# Patient Record
Sex: Male | Born: 1950
Health system: Southern US, Community
[De-identification: ages and names within clinical notes are randomized; demographics above are authoritative.]

## PROBLEM LIST (undated history)

## (undated) DIAGNOSIS — F319 Bipolar disorder, unspecified: Secondary | ICD-10-CM

## (undated) DIAGNOSIS — C801 Malignant (primary) neoplasm, unspecified: Secondary | ICD-10-CM

## (undated) DIAGNOSIS — J449 Chronic obstructive pulmonary disease, unspecified: Secondary | ICD-10-CM

## (undated) DIAGNOSIS — F419 Anxiety disorder, unspecified: Secondary | ICD-10-CM

## (undated) DIAGNOSIS — F32A Depression, unspecified: Secondary | ICD-10-CM

## (undated) DIAGNOSIS — F1910 Other psychoactive substance abuse, uncomplicated: Secondary | ICD-10-CM

## (undated) DIAGNOSIS — K7460 Unspecified cirrhosis of liver: Secondary | ICD-10-CM

## (undated) DIAGNOSIS — I639 Cerebral infarction, unspecified: Secondary | ICD-10-CM

## (undated) DIAGNOSIS — B1920 Unspecified viral hepatitis C without hepatic coma: Secondary | ICD-10-CM

## (undated) DIAGNOSIS — F329 Major depressive disorder, single episode, unspecified: Secondary | ICD-10-CM

## (undated) DIAGNOSIS — R0902 Hypoxemia: Secondary | ICD-10-CM

## (undated) DIAGNOSIS — R011 Cardiac murmur, unspecified: Secondary | ICD-10-CM

## (undated) HISTORY — DX: Hypoxemia: R09.02

## (undated) HISTORY — PX: OTHER SURGICAL HISTORY: SHX169

## (undated) HISTORY — DX: Chronic obstructive pulmonary disease, unspecified: J44.9

---

## 2000-10-18 ENCOUNTER — Inpatient Hospital Stay (HOSPITAL_COMMUNITY): Admission: EM | Admit: 2000-10-18 | Discharge: 2000-10-23 | Payer: Self-pay | Admitting: Psychiatry

## 2004-12-24 ENCOUNTER — Ambulatory Visit: Payer: Self-pay | Admitting: Family Medicine

## 2005-03-05 ENCOUNTER — Ambulatory Visit: Payer: Self-pay | Admitting: Family Medicine

## 2005-03-17 ENCOUNTER — Ambulatory Visit: Payer: Self-pay | Admitting: Family Medicine

## 2005-03-30 ENCOUNTER — Ambulatory Visit: Payer: Self-pay | Admitting: Family Medicine

## 2005-04-20 ENCOUNTER — Ambulatory Visit: Payer: Self-pay | Admitting: Family Medicine

## 2005-04-30 ENCOUNTER — Ambulatory Visit: Payer: Self-pay | Admitting: Family Medicine

## 2005-07-03 ENCOUNTER — Ambulatory Visit: Payer: Self-pay | Admitting: Family Medicine

## 2005-08-04 ENCOUNTER — Ambulatory Visit: Payer: Self-pay | Admitting: Family Medicine

## 2005-09-18 ENCOUNTER — Ambulatory Visit: Payer: Self-pay | Admitting: Family Medicine

## 2005-10-20 ENCOUNTER — Ambulatory Visit: Payer: Self-pay | Admitting: Internal Medicine

## 2005-10-20 ENCOUNTER — Encounter: Payer: Self-pay | Admitting: Internal Medicine

## 2005-10-20 ENCOUNTER — Ambulatory Visit (HOSPITAL_COMMUNITY): Admission: RE | Admit: 2005-10-20 | Discharge: 2005-10-20 | Payer: Self-pay | Admitting: Internal Medicine

## 2005-12-07 ENCOUNTER — Ambulatory Visit: Payer: Self-pay | Admitting: Family Medicine

## 2005-12-18 ENCOUNTER — Ambulatory Visit: Payer: Self-pay | Admitting: Family Medicine

## 2006-02-15 ENCOUNTER — Ambulatory Visit: Payer: Self-pay | Admitting: Cardiology

## 2007-02-17 ENCOUNTER — Ambulatory Visit: Payer: Self-pay | Admitting: Psychiatry

## 2007-02-17 ENCOUNTER — Inpatient Hospital Stay (HOSPITAL_COMMUNITY): Admission: AD | Admit: 2007-02-17 | Discharge: 2007-02-24 | Payer: Self-pay | Admitting: Psychiatry

## 2008-03-20 ENCOUNTER — Emergency Department (HOSPITAL_COMMUNITY): Admission: EM | Admit: 2008-03-20 | Discharge: 2008-03-21 | Payer: Self-pay | Admitting: Emergency Medicine

## 2008-06-02 ENCOUNTER — Emergency Department: Payer: Self-pay | Admitting: Emergency Medicine

## 2009-01-29 ENCOUNTER — Other Ambulatory Visit: Payer: Self-pay

## 2009-01-30 ENCOUNTER — Inpatient Hospital Stay (HOSPITAL_COMMUNITY): Admission: AD | Admit: 2009-01-30 | Discharge: 2009-02-06 | Payer: Self-pay | Admitting: Psychiatry

## 2009-01-30 ENCOUNTER — Ambulatory Visit: Payer: Self-pay | Admitting: Psychiatry

## 2009-04-17 ENCOUNTER — Ambulatory Visit: Payer: Self-pay | Admitting: Psychiatry

## 2009-04-17 ENCOUNTER — Other Ambulatory Visit: Payer: Self-pay | Admitting: Emergency Medicine

## 2009-04-17 ENCOUNTER — Inpatient Hospital Stay (HOSPITAL_COMMUNITY): Admission: RE | Admit: 2009-04-17 | Discharge: 2009-04-25 | Payer: Self-pay | Admitting: Psychiatry

## 2009-12-31 ENCOUNTER — Ambulatory Visit: Payer: Self-pay | Admitting: Psychiatry

## 2009-12-31 ENCOUNTER — Other Ambulatory Visit: Payer: Self-pay

## 2009-12-31 ENCOUNTER — Inpatient Hospital Stay (HOSPITAL_COMMUNITY): Admission: AD | Admit: 2009-12-31 | Discharge: 2010-01-04 | Payer: Self-pay | Admitting: Psychiatry

## 2010-08-09 ENCOUNTER — Other Ambulatory Visit: Payer: Self-pay | Admitting: Emergency Medicine

## 2010-08-09 ENCOUNTER — Inpatient Hospital Stay (HOSPITAL_COMMUNITY)
Admission: AD | Admit: 2010-08-09 | Discharge: 2010-08-15 | Payer: Self-pay | Source: Home / Self Care | Admitting: Psychiatry

## 2010-08-09 ENCOUNTER — Ambulatory Visit: Payer: Self-pay | Admitting: Psychiatry

## 2010-09-10 ENCOUNTER — Emergency Department (HOSPITAL_COMMUNITY): Admission: EM | Admit: 2010-09-10 | Discharge: 2010-09-10 | Payer: Self-pay | Admitting: Emergency Medicine

## 2010-11-06 ENCOUNTER — Ambulatory Visit: Admit: 2010-11-06 | Payer: Self-pay | Admitting: Gastroenterology

## 2010-11-18 ENCOUNTER — Encounter (INDEPENDENT_AMBULATORY_CARE_PROVIDER_SITE_OTHER): Payer: Self-pay

## 2010-12-11 NOTE — Letter (Signed)
Summary: Recall, Screening Colonoscopy Only  [facility]  [address]   [place], [place] [address]   Phone: [phone]  Fax: [phone]    November 18, 2010  THADEUS GANDOLFI [address] Shaune Pollack [place], [place]  [address] Jun 06, 1951   Dear Mr. THOMLEY,   Our records indicate it is time to schedule your colonoscopy.    Please call our office at [phone] and ask for the nurse.   Thank you,  Hendricks Limes, LPN Cloria Spring, LPN  [facility] Ph: [phone]   Fax: [phone]

## 2011-01-20 LAB — HEPATIC FUNCTION PANEL
AST: 162 U/L — ABNORMAL HIGH (ref 0–37)
Albumin: 3.8 g/dL (ref 3.5–5.2)
Alkaline Phosphatase: 90 U/L (ref 39–117)
Total Bilirubin: 0.9 mg/dL (ref 0.3–1.2)
Total Protein: 8.7 g/dL — ABNORMAL HIGH (ref 6.0–8.3)

## 2011-01-20 LAB — DIFFERENTIAL
Lymphs Abs: 3.7 10*3/uL (ref 0.7–4.0)
Monocytes Relative: 9 % (ref 3–12)
Neutro Abs: 6.1 10*3/uL (ref 1.7–7.7)
Neutrophils Relative %: 57 % (ref 43–77)

## 2011-01-20 LAB — URINALYSIS, ROUTINE W REFLEX MICROSCOPIC
Glucose, UA: NEGATIVE mg/dL
Ketones, ur: NEGATIVE mg/dL
Leukocytes, UA: NEGATIVE
Nitrite: NEGATIVE
Protein, ur: 30 mg/dL — AB
pH: 5.5 (ref 5.0–8.0)

## 2011-01-20 LAB — BASIC METABOLIC PANEL
Calcium: 8.6 mg/dL (ref 8.4–10.5)
Creatinine, Ser: 0.89 mg/dL (ref 0.4–1.5)
GFR calc Af Amer: >60 mL/min (ref >60)
GFR calc non Af Amer: >60 mL/min (ref >60)
Sodium: 136 mEq/L (ref 135–145)

## 2011-01-20 LAB — POCT CARDIAC MARKERS
Myoglobin, poc: 160 ng/mL (ref 12–200)
Troponin i, poc: <0.05 ng/mL (ref 0.00–0.09)

## 2011-01-20 LAB — CBC
Hemoglobin: 16.4 g/dL (ref 13.0–17.0)
Platelets: 99 10*3/uL — ABNORMAL LOW (ref 150–400)
RBC: 4.81 MIL/uL (ref 4.22–5.81)
WBC: 10.9 10*3/uL — ABNORMAL HIGH (ref 4.0–10.5)

## 2011-01-20 LAB — RAPID URINE DRUG SCREEN, HOSP PERFORMED
Amphetamines: NOT DETECTED
Benzodiazepines: POSITIVE — AB
Tetrahydrocannabinol: NOT DETECTED

## 2011-01-20 LAB — ETHANOL: Alcohol, Ethyl (B): 144 mg/dL — ABNORMAL HIGH (ref 0–10)

## 2011-01-20 LAB — URINE MICROSCOPIC-ADD ON

## 2011-01-22 LAB — BASIC METABOLIC PANEL
BUN: 5 mg/dL — ABNORMAL LOW (ref 6–23)
CO2: 21 mEq/L (ref 19–32)
Chloride: 105 mEq/L (ref 96–112)
Creatinine, Ser: 0.92 mg/dL (ref 0.4–1.5)
Glucose, Bld: 98 mg/dL (ref 70–99)
Potassium: 3.5 mEq/L (ref 3.5–5.1)

## 2011-01-22 LAB — DIFFERENTIAL
Basophils Absolute: 0 10*3/uL (ref 0.0–0.1)
Basophils Relative: 0 % (ref 0–1)
Eosinophils Absolute: 0.1 10*3/uL (ref 0.0–0.7)
Eosinophils Relative: 2 % (ref 0–5)
Lymphocytes Relative: 42 % (ref 12–46)
Monocytes Absolute: 0.7 10*3/uL (ref 0.1–1.0)

## 2011-01-22 LAB — CBC
HCT: 45.8 % (ref 39.0–52.0)
MCH: 34.9 pg — ABNORMAL HIGH (ref 26.0–34.0)
MCHC: 34.7 g/dL (ref 30.0–36.0)
MCV: 100.6 fL — ABNORMAL HIGH (ref 78.0–100.0)
Platelets: 88 10*3/uL — ABNORMAL LOW (ref 150–400)
RDW: 13.7 % (ref 11.5–15.5)

## 2011-01-22 LAB — RAPID URINE DRUG SCREEN, HOSP PERFORMED
Cocaine: POSITIVE — AB
Tetrahydrocannabinol: NOT DETECTED

## 2011-01-22 LAB — SALICYLATE LEVEL: Salicylate Lvl: <4 mg/dL (ref 2.8–20.0)

## 2011-01-22 LAB — ETHANOL: Alcohol, Ethyl (B): 95 mg/dL — ABNORMAL HIGH (ref 0–10)

## 2011-01-28 LAB — RAPID URINE DRUG SCREEN, HOSP PERFORMED
Cocaine: POSITIVE — AB
Opiates: NOT DETECTED

## 2011-01-28 LAB — BASIC METABOLIC PANEL
BUN: 8 mg/dL (ref 6–23)
GFR calc Af Amer: >60 mL/min (ref >60)
GFR calc non Af Amer: >60 mL/min (ref >60)
Potassium: 3.5 mEq/L (ref 3.5–5.1)

## 2011-01-28 LAB — CBC
HCT: 49.9 % (ref 39.0–52.0)
Platelets: 95 10*3/uL — ABNORMAL LOW (ref 150–400)
RBC: 5.31 MIL/uL (ref 4.22–5.81)
WBC: 9.8 10*3/uL (ref 4.0–10.5)

## 2011-01-28 LAB — DIFFERENTIAL
Eosinophils Relative: 1 % (ref 0–5)
Lymphocytes Relative: 43 % (ref 12–46)
Lymphs Abs: 4.2 10*3/uL — ABNORMAL HIGH (ref 0.7–4.0)
Monocytes Relative: 10 % (ref 3–12)
Neutrophils Relative %: 45 % (ref 43–77)

## 2011-01-28 LAB — URINALYSIS, ROUTINE W REFLEX MICROSCOPIC
Glucose, UA: 250 mg/dL — AB
Leukocytes, UA: NEGATIVE
pH: 5.5 (ref 5.0–8.0)

## 2011-01-28 LAB — ETHANOL: Alcohol, Ethyl (B): 127 mg/dL — ABNORMAL HIGH (ref 0–10)

## 2011-01-28 LAB — URINE MICROSCOPIC-ADD ON: RBC / HPF: NONE SEEN RBC/hpf (ref <3)

## 2011-02-04 ENCOUNTER — Emergency Department (HOSPITAL_COMMUNITY)
Admission: EM | Admit: 2011-02-04 | Discharge: 2011-02-04 | Disposition: A | Discharge status: Other Acute Inpatient Hospital | Payer: Medicare Other | Attending: Emergency Medicine | Admitting: Emergency Medicine

## 2011-02-04 DIAGNOSIS — R45851 Suicidal ideations: Secondary | ICD-10-CM | POA: Insufficient documentation

## 2011-02-04 DIAGNOSIS — F3289 Other specified depressive episodes: Secondary | ICD-10-CM | POA: Insufficient documentation

## 2011-02-04 DIAGNOSIS — F329 Major depressive disorder, single episode, unspecified: Secondary | ICD-10-CM | POA: Insufficient documentation

## 2011-02-04 LAB — RAPID URINE DRUG SCREEN, HOSP PERFORMED: Benzodiazepines: POSITIVE — AB

## 2011-02-04 LAB — BASIC METABOLIC PANEL
Chloride: 105 mEq/L (ref 96–112)
Creatinine, Ser: 1.01 mg/dL (ref 0.4–1.5)
GFR calc Af Amer: >60 mL/min (ref >60)

## 2011-02-04 LAB — URINALYSIS, ROUTINE W REFLEX MICROSCOPIC
Glucose, UA: NEGATIVE mg/dL
Protein, ur: NEGATIVE mg/dL
Urobilinogen, UA: 0.2 mg/dL (ref 0.0–1.0)

## 2011-02-04 LAB — CBC
MCH: 30.6 pg (ref 26.0–34.0)
MCHC: 34.8 g/dL (ref 30.0–36.0)
MCV: 87.8 fL (ref 78.0–100.0)
Platelets: 71 10*3/uL — ABNORMAL LOW (ref 150–400)
RBC: 4.58 MIL/uL (ref 4.22–5.81)
RDW: 14.2 % (ref 11.5–15.5)

## 2011-02-04 LAB — DIFFERENTIAL
Eosinophils Absolute: 0.2 10*3/uL (ref 0.0–0.7)
Eosinophils Relative: 2 % (ref 0–5)
Lymphs Abs: 3.4 10*3/uL (ref 0.7–4.0)
Monocytes Absolute: 0.9 10*3/uL (ref 0.1–1.0)
Monocytes Relative: 10 % (ref 3–12)

## 2011-02-04 LAB — ETHANOL: Alcohol, Ethyl (B): 129 mg/dL — ABNORMAL HIGH (ref 0–10)

## 2011-02-16 LAB — CBC
HCT: 45.6 % (ref 39.0–52.0)
Platelets: 132 10*3/uL — ABNORMAL LOW (ref 150–400)
WBC: 5.4 10*3/uL (ref 4.0–10.5)

## 2011-02-16 LAB — LIPID PANEL: VLDL: 41 mg/dL — ABNORMAL HIGH (ref 0–40)

## 2011-02-16 LAB — DIFFERENTIAL
Eosinophils Relative: 2 % (ref 0–5)
Lymphocytes Relative: 32 % (ref 12–46)
Lymphs Abs: 1.7 10*3/uL (ref 0.7–4.0)
Neutro Abs: 3 10*3/uL (ref 1.7–7.7)

## 2011-02-16 LAB — ETHANOL: Alcohol, Ethyl (B): <5 mg/dL (ref 0–10)

## 2011-02-16 LAB — HEPATIC FUNCTION PANEL
ALT: 187 U/L — ABNORMAL HIGH (ref 0–53)
ALT: 210 U/L — ABNORMAL HIGH (ref 0–53)
AST: 177 U/L — ABNORMAL HIGH (ref 0–37)
AST: 183 U/L — ABNORMAL HIGH (ref 0–37)
Bilirubin, Direct: 0.1 mg/dL (ref 0.0–0.3)
Total Bilirubin: 0.6 mg/dL (ref 0.3–1.2)
Total Protein: 7.1 g/dL (ref 6.0–8.3)

## 2011-02-16 LAB — GLUCOSE, CAPILLARY: Glucose-Capillary: 161 mg/dL — ABNORMAL HIGH (ref 70–99)

## 2011-02-16 LAB — RAPID URINE DRUG SCREEN, HOSP PERFORMED
Cocaine: NOT DETECTED
Tetrahydrocannabinol: NOT DETECTED

## 2011-02-16 LAB — BASIC METABOLIC PANEL
BUN: 9 mg/dL (ref 6–23)
Calcium: 9.2 mg/dL (ref 8.4–10.5)
GFR calc non Af Amer: >60 mL/min (ref >60)
Potassium: 3.9 mEq/L (ref 3.5–5.1)
Sodium: 135 mEq/L (ref 135–145)

## 2011-02-16 LAB — AMYLASE: Amylase: 202 U/L — ABNORMAL HIGH (ref 27–131)

## 2011-02-19 LAB — URINALYSIS, ROUTINE W REFLEX MICROSCOPIC
Leukocytes, UA: NEGATIVE
Nitrite: NEGATIVE
Specific Gravity, Urine: 1.015 (ref 1.005–1.030)
Urobilinogen, UA: 0.2 mg/dL (ref 0.0–1.0)
pH: 5.5 (ref 5.0–8.0)

## 2011-02-19 LAB — DIFFERENTIAL
Basophils Absolute: 0 10*3/uL (ref 0.0–0.1)
Eosinophils Relative: 1 % (ref 0–5)
Lymphocytes Relative: 40 % (ref 12–46)
Monocytes Absolute: 0.5 10*3/uL (ref 0.1–1.0)
Monocytes Relative: 7 % (ref 3–12)
Neutro Abs: 3.7 10*3/uL (ref 1.7–7.7)

## 2011-02-19 LAB — CBC
HCT: 48.2 % (ref 39.0–52.0)
Hemoglobin: 17 g/dL (ref 13.0–17.0)
MCHC: 35.4 g/dL (ref 30.0–36.0)
RBC: 5.01 MIL/uL (ref 4.22–5.81)
RDW: 13.9 % (ref 11.5–15.5)

## 2011-02-19 LAB — BASIC METABOLIC PANEL
CO2: 27 mEq/L (ref 19–32)
Calcium: 8.7 mg/dL (ref 8.4–10.5)
GFR calc Af Amer: >60 mL/min (ref >60)
GFR calc non Af Amer: >60 mL/min (ref >60)
Glucose, Bld: 109 mg/dL — ABNORMAL HIGH (ref 70–99)
Potassium: 3.9 mEq/L (ref 3.5–5.1)
Sodium: 141 mEq/L (ref 135–145)

## 2011-02-19 LAB — PLATELET COUNT: Platelets: 71 10*3/uL — ABNORMAL LOW (ref 150–400)

## 2011-02-19 LAB — HEPATIC FUNCTION PANEL
ALT: 107 U/L — ABNORMAL HIGH (ref 0–53)
AST: 117 U/L — ABNORMAL HIGH (ref 0–37)
Albumin: 3.6 g/dL (ref 3.5–5.2)
Alkaline Phosphatase: 152 U/L — ABNORMAL HIGH (ref 39–117)
Indirect Bilirubin: 1.1 mg/dL — ABNORMAL HIGH (ref 0.3–0.9)
Total Protein: 7.7 g/dL (ref 6.0–8.3)

## 2011-02-19 LAB — TSH: TSH: 1.942 u[IU]/mL (ref 0.350–4.500)

## 2011-02-19 LAB — RAPID URINE DRUG SCREEN, HOSP PERFORMED
Barbiturates: NOT DETECTED
Opiates: NOT DETECTED

## 2011-03-11 ENCOUNTER — Emergency Department (HOSPITAL_COMMUNITY)
Admission: EM | Admit: 2011-03-11 | Discharge: 2011-03-11 | Disposition: A | Discharge status: Other Acute Inpatient Hospital | Payer: Medicare Other | Attending: Emergency Medicine | Admitting: Emergency Medicine

## 2011-03-11 DIAGNOSIS — Z79899 Other long term (current) drug therapy: Secondary | ICD-10-CM | POA: Insufficient documentation

## 2011-03-11 DIAGNOSIS — F1010 Alcohol abuse, uncomplicated: Secondary | ICD-10-CM | POA: Insufficient documentation

## 2011-03-11 DIAGNOSIS — B1920 Unspecified viral hepatitis C without hepatic coma: Secondary | ICD-10-CM | POA: Insufficient documentation

## 2011-03-11 DIAGNOSIS — R748 Abnormal levels of other serum enzymes: Secondary | ICD-10-CM | POA: Insufficient documentation

## 2011-03-11 DIAGNOSIS — F319 Bipolar disorder, unspecified: Secondary | ICD-10-CM | POA: Insufficient documentation

## 2011-03-11 LAB — COMPREHENSIVE METABOLIC PANEL
ALT: 117 U/L — ABNORMAL HIGH (ref 0–53)
Albumin: 3.6 g/dL (ref 3.5–5.2)
Calcium: 8.8 mg/dL (ref 8.4–10.5)
GFR calc Af Amer: >60 mL/min (ref >60)
Glucose, Bld: 107 mg/dL — ABNORMAL HIGH (ref 70–99)
Sodium: 135 mEq/L (ref 135–145)
Total Protein: 8.2 g/dL (ref 6.0–8.3)

## 2011-03-11 LAB — URINALYSIS, ROUTINE W REFLEX MICROSCOPIC
Ketones, ur: NEGATIVE mg/dL
Leukocytes, UA: NEGATIVE
Nitrite: NEGATIVE
Protein, ur: NEGATIVE mg/dL
Urobilinogen, UA: 0.2 mg/dL (ref 0.0–1.0)

## 2011-03-11 LAB — URINE MICROSCOPIC-ADD ON

## 2011-03-11 LAB — DIFFERENTIAL
Basophils Absolute: 0 10*3/uL (ref 0.0–0.1)
Basophils Relative: 0 % (ref 0–1)
Eosinophils Absolute: 0.1 10*3/uL (ref 0.0–0.7)
Eosinophils Relative: 2 % (ref 0–5)
Monocytes Absolute: 0.8 10*3/uL (ref 0.1–1.0)

## 2011-03-11 LAB — RAPID URINE DRUG SCREEN, HOSP PERFORMED
Benzodiazepines: NOT DETECTED
Cocaine: NOT DETECTED
Tetrahydrocannabinol: NOT DETECTED

## 2011-03-11 LAB — CBC
HCT: 41.1 % (ref 39.0–52.0)
MCHC: 34.8 g/dL (ref 30.0–36.0)
Platelets: 59 10*3/uL — ABNORMAL LOW (ref 150–400)
RDW: 14.9 % (ref 11.5–15.5)
WBC: 6.2 10*3/uL (ref 4.0–10.5)

## 2011-03-11 LAB — ETHANOL: Alcohol, Ethyl (B): 145 mg/dL — ABNORMAL HIGH (ref 0–10)

## 2011-03-24 NOTE — H&P (Signed)
NAMELARRELL, RAPOZO NO.:  [id number]   MEDICAL RECORD NO.:  [id number]          PATIENT TYPE:  IPS   LOCATION:  0506                          FACILITY:  [facility]   PHYSICIAN:  Geoffery Lyons, M.D.      DATE OF BIRTH:  1951/04/04   DATE OF ADMISSION:  04/17/2009  DATE OF DISCHARGE:                       PSYCHIATRIC ADMISSION ASSESSMENT   HISTORY:  This is a 60 year old male involuntary petitioned on April 17, 2009.  The patient is here on petition.  The patient was petitioned per  his mother with petition papers stating that the subject has been on the  street using drugs and alcohol for the past 2 weeks, refusing attempts  to help him.  Refuses to let anyone treat him.  Causing disturbances in  the apartment.  Picking fights with neighbors and challenging them to  fight.  Considered a danger to himself in current condition.  The  patient does state that he has been depressed, having some passive  suicidal thoughts, using alcohol and got a little drunk.  He states  last use of alcohol was 2 days ago.  He has been sober for 4-1/2 years.  Relapsed back in 2007 and is considering going to a long-term program.   PAST PSYCHIATRIC HISTORY:  The patient was here March 2010 and has been  in [facility] in the past.   SOCIAL HISTORY:  He lives alone in [place], [place].  He is on  disability.  No legal problems.   FAMILY HISTORY:  None.   ALCOHOL/DRUG HISTORY:  The patient has been drinking heavily and pretty  consistently.  Denies any seizure activity.  No blackouts.   PRIMARY CARE Ilyana Manuele:  Delaney Meigs, M.D.   MEDICAL PROBLEMS:  History of hepatitis C.   MEDICATIONS:  Last medications listed are Risperdal, trazodone and  Effexor.  The patient does report some benefit from the Effexor.   DRUG ALLERGIES:  NO KNOWN ALLERGIES.   PHYSICAL EXAMINATION:  GENERAL:  This is an unkempt middle-aged male.  Initially he had a mild tremor.  He did receive Ativan  and Zofran in the  emergency department.  He is 5 feet 6 inches tall, 192 pounds.  VITAL SIGNS:  97.7, 87 heart rate, 18 respirations, blood pressure is  140/95.   MENTAL STATUS EXAM:  He is fully alert and cooperative with good eye  contact.  His speech is clear.  Mood is neutral.  The patient again  appears flat, cooperative, asked appropriate questions.  Thought process  are coherent and goal directed.  No evidence of any delusional  statements.  Does not appear to be responding to internal stimuli.  Cognitive function intact.  Memory appears intact.  Judgment and insight  are fair.  Poor impulse control related to alcohol use.   AXIS I:  Mood disorder not otherwise specified, polysubstance abuse.  AXIS II:  Deferred.  AXIS III:  Hepatitis C.  AXIS IV:  Psychosocial problems related to chronic substance use.  AXIS V:  Current 35-40.   PLAN:  Our plan is to detox the patient with the  Librium protocol.  Work  on relapse prevention.  We will continue to assess comorbidities.  The  patient may benefit from a long-term program.  We will continue to  identify support.  Tentative length of stay at this time is 3-5 days.      Landry Corporal, N.P.      Geoffery Lyons, M.D.  Electronically Signed    JO/MEDQ  D:  04/18/2009  T:  04/18/2009  Job:  [id number]

## 2011-03-24 NOTE — H&P (Signed)
[name], [name] NO.:  [id number]   MEDICAL RECORD NO.:  [id number]          PATIENT TYPE:  IPS   LOCATION:  0400                          FACILITY:  BH   PHYSICIAN:  Anselm Jungling, MD  DATE OF BIRTH:  06/02/1951   DATE OF ADMISSION:  01/30/2009  DATE OF DISCHARGE:                       PSYCHIATRIC ADMISSION ASSESSMENT   IDENTIFICATION:  This is a 60 year old male, single.  This is an  involuntary admission.   HISTORY OF PRESENT ILLNESS:  Patrick [name] has presented requesting a detox  from alcohol.  He relapsed on alcohol about 3 months ago and has been  drinking quite a bit of alcohol in escalating fashion.  Alcohol level  240 mg percent in the emergency room.  He also smoked 200 dollars worth  of crack cocaine yesterday which he does on rare occasion.  Says that he  attributes his relapse to depression, had depressed mood for about 5-6  months prior to admission with low energy, some increased anhedonia,  feeling a lack of mental focus, lack of energy or motivation.  While  intoxicated yesterday, had suicidal thoughts of possibly laying down on  the railroad tracks to kill myself, wants to read achieve sobriety and  had previously been abstinent from all substances for about 4-1/2 years  while attending group support services.  Also recently had a broken up  with his girlfriend of many years which had compounded his depression.  He admits that his recent alcohol use was a factor in the breakup.  He  denies any suicidal thoughts today.  The patient has tried to cut down  on his drinking, but reports that he gets anxious and sweaty, feels very  jittery, has been waking up at night requiring alcohol to go back to  sleep and stop sweating.  Denies a history of seizures.   PAST PSYCHIATRIC HISTORY:  Currently followed for bipolar depression by  [facility], and has an appointment with Dr. Raliegh Scarlet on  April 5 at 3:40 p.m.  He has been attending a  church based alcohol  abstinence support group named [facility].  Says he gets good  support there.  Feels his family support is good.  Has been compliant  with his medications which include Klonopin.  Says he would like to  consider getting off the Klonopin if possible, but has been unable to  decrease it without getting a shaky or anxious.   SOCIAL HISTORY:  Single male.  He is on disability for bipolar  depression.  Mother lives next door to him.  He has a 63 year old son  and 63-year-old granddaughter that lives close by in [place], and he cites  his love for the granddaughter as his primary deterrent to any suicide.  Feels that he has good family support and good support through his  [facility] group.  No legal problems.   FAMILY HISTORY:  Father with a history of benzodiazepine, amphetamine  and alcohol abuse.   MEDICAL HISTORY:  Currently followed by Dr. Lysbeth Galas at [facility].   MEDICAL PROBLEMS:  None.   CURRENT MEDICATIONS:  1. Effexor XR 75 mg daily which he says he has taken steadily for      about 7 years.  2. Klonopin 1 mg.  He takes 1/2 tablet in the morning and in the      evening and one whole tablet at bedtime for a total of 2 mg daily.      Has been on this for several years.   DRUG ALLERGIES:  None.   POSITIVE PHYSICAL FINDINGS:  Physical exam done in the emergency room,  medium built gentleman in no distress.   DIAGNOSTIC STUDIES:  Alcohol 240 mg/dL.  Urine drug screen was positive  for cocaine and benzodiazepines.  Chemistry within normal limits.  BUN  8, creatinine 0.97.  CBC was remarkable for platelets of 105,000,  hemoglobin 17, hematocrit 48.2, MCV 96.2, normal white count.  Routine  urinalysis within normal limits.  Full physical exam noted in the  record.   MENTAL STATUS EXAM:  Fully alert male, cooperative, good eye contact.  __________appears less than 5 currently.  No tremor or diaphoresis.  Cooperative, asking for  help with his drinking and feeling that he has  an underlying depression that needs to be addressed.  Reports medication  compliance.  Is interested in coming off the Klonopin.  Speech is  normal.  Gives a coherent history.  Mood is neutral.  Thought process  logical, goal directed.  Denying any suicidal thoughts today.  No signs  of delirium.  No evidence of psychosis.  Cognitively, he is fully  oriented in full contact with reality with a good insight.  Impulse  control and judgment within normal limits.  Immediate recent remote  memory are intact.   DIAGNOSES:  AXIS I:  Depressive disorder NOS and alcohol abuse and  dependence.  AXIS II:  No diagnosis.  AXIS III:  Hepatitis C by history, thrombocytopenia.  AXIS IV:  Severe issues with relationship breakup having supportive  family is an asset.  AXIS V:  Current 44, past year not known.   PLAN:  Voluntarily admit him to evaluate his mood, control his  depression and to give him a safe detox from alcohol and other  substances.  He is on a Librium protocol.  We are going to check a B12  level and RPR, TSH and will recheck his platelet count.  We are going to  discontinue the Klonopin and have explained to him the process of detox.  He is currently on a Librium protocol.  We will increase his Effexor to  75 mg q.a.m. and 37-1/2 mg q. noon for one week, then 150 mg daily and  have discussed considering the chemical dependency outpatient clinic.      Patrick [name], N.P.      Anselm Jungling, MD  Electronically Signed    MAS/MEDQ  D:  01/30/2009  T:  01/30/2009  Job:  [id number]

## 2011-03-24 NOTE — Discharge Summary (Signed)
Patrick [name], BRILES NO.:  [id number]   MEDICAL RECORD NO.:  [id number]          PATIENT TYPE:  IPS   LOCATION:  0503                          FACILITY:  BH   PHYSICIAN:  Anselm Jungling, MD  DATE OF BIRTH:  December 04, 1950   DATE OF ADMISSION:  01/30/2009  DATE OF DISCHARGE:  02/06/2009                               DISCHARGE SUMMARY   IDENTIFYING DATA AND REASON FOR ADMISSION:  This was an inpatient  psychiatric admission for [name], a 60 year old white male with a  history of bipolar disorder, who presented for substance detoxification.  Please refer to the admission note for further details pertaining to the  symptoms, circumstances and history that led to his hospitalization.  He  was given initial Axis I diagnoses of alcohol dependence, and bipolar  disorder by history.   MEDICAL AND LABORATORY:  The patient was medically and physically  assessed by the psychiatric nurse practitioner.  He was in good health  without any active or chronic medical problems.  There were no  significant medical issues.   HOSPITAL COURSE:  The patient was admitted to the adult inpatient  psychiatric service.  He presented as a well-nourished, normally-  developed male who was alert, fully oriented, grim, disheveled, and  tremulous.  There were no signs or symptoms of psychosis or delirium.  He was absent of any suicidal ideation and verbalized a strong desire  for help.   He was placed on a Librium withdrawal protocol, and with respect to his  bipolar disorder, was treated with a regimen of Effexor XR, which was  increased to 112.5 mg daily, trazodone 50 mg as needed for bed, and  Risperdal 0.25 mg t.i.d.  These were all well tolerated.  His  detoxification proceeded uneventfully.  He participated in therapeutic  groups and activities including those geared towards 12-step recovery.  He worked closely with the case manager towards discharge and aftercare  plan.  He was  appropriate for discharge on the eighth hospital day.  He  agreed to the following.   AFTERCARE:  The patient was to follow up at [facility] [place] with [place] appointment on April 5 and 3:40 p.m.   DISCHARGE MEDICATIONS:  1. Effexor XR 112.5 mg daily.  2. Risperdal 0.25 mg t.i.d.  3. Trazodone 50 mg q.h.s.   DISCHARGE DIAGNOSES:  AXIS I:  Alcohol dependence, early remission and  history of bipolar disorder not otherwise specified.  AXIS II:  Deferred.  AXIS III:  No acute or chronic illnesses.  AXIS IV:  Stressors severe.  AXIS V:  GAF on discharge 55.      Anselm Jungling, MD  Electronically Signed     SPB/MEDQ  D:  02/06/2009  T:  02/06/2009  Job:  [id number]

## 2011-03-27 NOTE — H&P (Signed)
[facility]  Patient:    Patrick [name], Patrick [name]                      MRN: [id number] Adm. Date:  [id number] Attending:  Beryle Beams                   Psychiatric Admission Assessment  PRESENT HISTORY:  The patient is a 60 year old divorced Caucasian male who has been progressively depressed since May 2001.  The patient has been drinking a 12-pack of beer a day and using crack cocaine occasionally.  He has experienced suicidal ideas with plan to walk in front of a train.  Symptoms endorsed include anhedonia, depression, insomnia, anorexia, and pervasive depression.  PAST PSYCHIATRIC HISTORY:  Last November, he saw Dr. Ashley Royalty in [place] and was placed on Paxil.  He has been hospitalized at [facility] for management of depression in 1994.  SOCIAL HISTORY:  The patient is single and lives in [place].  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  He has been divorced since 67.  He has one son, 53, with whom he has a distant relationship.  FAMILY HISTORY:  His father is bipolar, his sister attempted suicide, and he has a brother who is a recovering alcoholic.  ALCOHOL AND DRUG HISTORY:  The patient drinks a 12-pack of beer a day and uses crack cocaine occasionally.  He is motivated to abstain.  PAST MEDICAL HISTORY:  The patient is under the care of Dr. Colon Flattery in Medicine. Primary medical problem is hepatitis C, and he has had no treatment for that.  He is not on any medications currently.  Drug allergies:  None.  PHYSICAL EXAMINATION:  Patient presents as well-developed male in no acute physical distress.  Blood pressure is 120/70.  Pulse is 99 and regular, respirations and temperature normal.  Patient is 187 pounds in weight and is 5 feet 8 inches in height.  HEAD normocephalic and atraumatic with normal complexion and distribution of hair. Breath sounds are active.  NECK supple with no  pharyngomegaly or masses.  Trachea is central.  CHEST:  Normal to P&A.  HEART:  Normal sounds without murmurs or gallop.  ABDOMEN:  Soft without organomegaly or masses.  EXTREMITIES:   No cyanosis, clubbing or edema.  NEUROLOGICAL:  Cranial nerves 2-12 intact.  Reflexes are brisk and bilaterally equal.  There is no evidence of wasting or fasciculations.  Cerebellar functions intact.  MENTAL STATUS EXAMINATION:  Patient presents as a neatly dressed and normal individual who is polite and cooperative.  He shows evidence of psychomotor retardation and some hesitancy of speech.  Otherwise, he denies psychotic symptoms and is not hallucinatory.  Does not show evidence of thought disorder.  Affect is appropriate to thought content but flat.  Mood is depressed.  Admits to suicidal aspirations but is willing to contract for his own safety.  Sensorium is clear with oriented to time, place, and situation. Memory is intact.  DIAGNOSES: Axis I:     1. Major depressive disorder, recurrent.             2. Rule out bipolar disorder.             3. Alcohol dependence. Axis II:    Deferred. Axis III:   Hepatitis C. Axis IV:    Severe, problems with primary support group. Axis V:     Global assessment of  function on admission 35, in the past             year 90.  TREATMENT CONSIDERATIONS:  Patient is to be detoxified on a high-dose Librium protocol and started on antidepressants and a mood stabilizer, specifically Effexor and lithium carbonate.  Baseline studies to include urinalysis, urine drug screen, liver functions, CMET, a thyroid profile, CBC, magnesium level and an EKG.  He also to receive prescriptions for Sinergan and Prilosec since he has mild alcoholic gastritis.  Patient is to be followed daily in psychotherapy and in medication management and is to participate in group, milieu and individual therapy.  EXPECTED DURATION OF STAY:  About 5 days. DD:  10/23/00 TD:  10/23/00 Job:  [id number]

## 2011-03-27 NOTE — Discharge Summary (Signed)
NAMEDIMITRIOUS, MICCICHE NO.:  [id number]   MEDICAL RECORD NO.:  [id number]          PATIENT TYPE:  IPS   LOCATION:  0300                          FACILITY:  BH   PHYSICIAN:  Geoffery Lyons, M.D.      DATE OF BIRTH:  10/05/1951   DATE OF ADMISSION:  04/17/2009  DATE OF DISCHARGE:  04/25/2009                               DISCHARGE SUMMARY   CHIEF COMPLAINT/PRESENT ILLNESS:  This was one of several admissions to  [facility] for this 60 year old male involuntarily  committed.  He was petitioned by his mother as he has been on the street  using drugs and alcohol for the past 2 weeks prior to this admission,  refusing attempts to help him, refused to let anyone treat him, causing  disturbance in the apartment, picking fight with neighbors, challenging  them to fight.  He endorsed being depressed, having thoughts of suicide,  using alcohol.  He claimed he got a little drunk.  Last use 2 days  prior to this admission.  He had been sober for 4-1/2 years, recently  has not been able to get his self together, relapsed back in 2007.   PAST PSYCHIATRIC HISTORY:  Several admissions to [facility].  He  was admitted March 2010, and had been in [facility].   ALCOHOL AND DRUG HISTORY:  Persistent use of alcohol.   MEDICAL HISTORY:  History of hepatitis C.   MEDICATIONS:  He was on Risperdal, trazodone and Effexor.   Physical exam failed to show any acute findings.   LABORATORY WORKUP:  SGOT 177, SGPT 187, total bilirubin 0.7, amylase  202, lipase 37.   MENTAL STATUS EXAM:  Reveals a fully alert cooperative male, good eye  contact.  Speech is clear.  Mood is depressed.  Affect is depressed.  Thought processes is logical, coherent and relevant.  No active  delusions.  No active suicidal or homicidal ideas, no hallucinations.  Cognition well-preserved.   ADMITTING DIAGNOSES:  Axis I:  Alcohol dependence.  Mood disorder, not  otherwise specified.  Axis II:  No diagnosis..  Axis III:  Hepatitis C.  Axis IV: Moderate.  Axis V:  Global Assessment of Functioning upon admission 35, highest  Global Assessment of Functioning in the last year 60.   COURSE IN THE HOSPITAL:  He was admitted and started in individual and  group psychotherapy.  We started detoxing with Librium.  He was  eventually placed on Effexor and Seroquel.  As already stating, he  endorsed he got out of the hospital in March.  Claimed that he got  lonely, depressed, relapsed 5-weeks prior to this admission with  alcohol, then cocaine, fifth of vodka, 14-18 beers.  Living by himself  on disability.  Endorsed persistent cravings when he left.  He had been  on Effexor, Risperdal and trazodone.  He has been in [facility],  recovery in __________ in [place], [place].  He had 4-1/2 years of  sobriety in 2003.  On April 19, 2009, very upset, tearful.  Endorsed that  his family has basically written him off.  He feels no support.  He does  say that he understand where they are coming from, feeling guilty,  hopeless, helpless, overwhelmed, difficulty with sleep, appetite, some  worrisome anxiety, some ruminating.  He continued to endorse that the  family has given up on him, feeling hopeless.  There was a family  session with his brother.  He became very emotional and began crying.  Brother and mother expressed support.  They wanted him to go a long-term  program.  The brother wanted him to consider [facility].  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  Manoj was willing to do  whatever he needed to do to get his life back together.  The son  continued to be going through dysperception, hostile towards him.  He  wanted to get his life back together.  [facility] to [facility].  There was some anxiety, worry, agitation.  On April 25, 2009, he was  better.  He was committed to abstinence, his mood improved.  He was  accepted to go to [facility] in [place],  90-day treatment program.  He was encouraged.  He was motivated.  He is feeling was that he when he  went to through the program that he was going to be able to get his life  together.   DISCHARGE DIAGNOSES:  Axis I:  Alcohol dependence, cocaine abuse, mood  disorder, not otherwise specified.  Depressive disorder, not otherwise  specified.  Axis II:  No diagnosis.  Axis II:  Hepatitis C.  Axis IV:  Moderate.  Axis V:  Global Assessment of Functioning on discharge 50-55.   Discharged on Effexor XR 37.5 mg per day, Seroquel 100 one-half twice a  day and 1 at night, trazodone 100 at bedtime for sleep.  Follow-up at  [facility].      Geoffery Lyons, M.D.  Electronically Signed     IL/MEDQ  D:  05/23/2009  T:  05/23/2009  Job:  [id number]

## 2011-03-27 NOTE — H&P (Signed)
NAMEVERNICE, BOWKER NO.:  [id number]   MEDICAL RECORD NO.:  [id number]          PATIENT TYPE:  IPS   LOCATION:  0500                          FACILITY:  BH   PHYSICIAN:  Patrick [name], M.D.      DATE OF BIRTH:  [birth year]   DATE OF ADMISSION:  02/17/2007  DATE OF DISCHARGE:                       PSYCHIATRIC ADMISSION ASSESSMENT   Patrick Stakes, NP, dictating for Patrick [name], M.D.   HISTORY OF PRESENT ILLNESS:  A 60 year old divorced white male  voluntarily admitted February 17, 2007.   The patient presents with a history of alcohol use.  Relapsed in  January, 2008, drinking beer averaging about 4-5 but can drink up to 16-  18 at a time.  Also has been now using powder and crack cocaine.  Has  been using cocaine for the past 5-8years.  The patient was sober for 4  years.  Again, relapsing in January, 2008.  His last drink was on February 17, 2007.  He denies any depression or suicidal thoughts.  Sleeping  about 4 hours with the Klonopin at bedtime.  Denies any abuse of  Klonopin.   PAST PSYCHIATRIC ISSUES:  1. Second admission, was here about 7 years ago for alcohol.  2. History bipolar disorder.  3. Has a history of a suicide attempt by overdosing, sees Dr. Cecelia [name]      for outpatient mental health services.   SOCIAL HISTORY:  A 81 of divorced white male, lives alone, on  Disability.  Has 11th grade education.  No legal issues.   FAMILY HISTORY:  Father with alcohol problems.   ALCOHOL/DRUG HISTORY:  The patient smokes.  He reports drinking at times  in the morning.  Denies any seizure activities.   PRIMARY CARE Patrick [name]:  None.   MEDICAL PROBLEMS.:  History of hepatitis C.   MEDICATIONS:  Has been on Klonopin at bedtime, Effexor XR 37.5 daily, 75  daily.   DRUG ALLERGIES:  No known allergies.   PHYSICAL EXAMINATION:  The patient was fully assessed at [facility].  Temperature is 97.6, 85 heart rate, 18 respirations, blood  pressure 118/67, 5  feet 8 inches tall, 184 pounds.  This a middle-aged  male in no acute distress.  He is non-tremulous.   LABORATORY DATA:  On this physical exam, platelet count was 211,  hemoglobin 17.5, glucose of 107.  Urine drug screen is positive for  cocaine.  Alcohol level was 245, down to less than 5 upon transfer.  Urinalysis was within normal limits   MENTAL STATUS EXAM:  He is fully alert, cooperative, good eye contact,  casually dressed.  Speech is clear, normal pace and tone.  The patient's  mood is neutral.  The patient's affect is flat.  Thought processes  coherent.  There is no evidence of psychosis.  Cognitive function  intact.  Memory is good.  Judgment is good.  Insight is good.   DIAGNOSES:  AXIS I:  Alcohol dependence, cocaine abuse. depressive  disorder not otherwise specified.  AXIS II:  Deferred.  AXIS III:  Hepatitis C.  AXIS IV:  Medical  problems.  Possible other psychosocial problems  related to substance abuse.  AXIS V:  Current is 40-45.   PLAN:  1. To detox the patient with Librium protocol.  2. The patient was advised to will be discontinuing his Klonopin.  3. We will continue with his Effexor.  4. Will work on relapse prevention.  5. Will have some trazodone for sleep.  6. Tentative length of stay is 4-5 days.      Patrick [name], N.P.      Patrick [name], M.D.  Electronically Signed    JO/MEDQ  D:  02/18/2007  T:  02/18/2007  Job:  [id number]

## 2011-03-27 NOTE — Discharge Summary (Signed)
NAMEERCOLE, GEORG NO.:  [id number]   MEDICAL RECORD NO.:  [id number]          PATIENT TYPE:  IPS   LOCATION:  0500                          FACILITY:  [facility]   PHYSICIAN:  Geoffery Lyons, M.D.      DATE OF BIRTH:  [birth year]   DATE OF ADMISSION:  02/17/2007  DATE OF DISCHARGE:  02/24/2007                               DISCHARGE SUMMARY   CHIEF COMPLAINT:  This is the second admission to [facility] for this 60 year old divorced white male voluntarily admitted.  History of alcohol use relapsing January 8008, drinking beer averaging  about 4-5, can drink up to 16 or 18, using powder and crack cocaine, has  been using cocaine for the past 5-8 years, sober for 4 years, relapsing  in January 2008.  Last drink was on April 10.  Denies any depression or  suicidal thoughts, sleeping about 4 hours with the Klonopin at night.   PAST PSYCHIATRIC HISTORY:  Second admission was here 7 years prior to  this admission, history of bipolar disorder, history of suicide attempt  by overdosing, been seen on an outpatient basis.   ALCOHOL AND DRUG HISTORY:  Persistent use of alcohol, as well as  cocaine.  Has been drinking in the morning.   PAST MEDICAL HISTORY:  Hepatitis C.   MEDICATION:  Has been on Klonopin at bedtime, Effexor XR 37.5 in the  morning, 25 in the afternoon.   PHYSICAL EXAMINATION:  Did not show any acute findings.   LABORATORY WORKUP:  Hemoglobin 17.5, glucose 107.  Urine drug screen  positive for cocaine.  Alcohol level 245.   MENTAL STATUS EXAM:  Alert, cooperative male, good eye contact.  Casually dressed.  Speech was clear, normal in pace, tempo and  production.  Mood was anxious, depressed.  Affect was constricted.  Thought processes were logical, clear and relevant.  No evidence of  delusions.  No evidence of suicidal or homicidal ideas, no  hallucinations.  Cognition well-preserved.   AXIS I: Alcohol dependence, cocaine abuse.   Depressive disorder not  otherwise specified.  AXIS II: No diagnosis.  AXIS III:  Hepatitis C.  AXIS IV: Moderate.  AXIS V:  GAF on admission 48, GAF in the last year 60.   COURSE IN THE HOSPITAL:  We went ahead and started detoxing him with  Librium.  He was placed back on the Effexor and he was given trazodone  for sleep.  He was also started on Campral.  Endorsed that he mentally  has been pretty rough since January with increased use of alcohol, 4-5  to 18 beers as well as wine almost every day.  Loneliness.  Engaged to  be married in May, did not happen, dating for around 30 years.  Since it  did not work out, became upset.  Four years sober, one detox in  [place], going to meetings, slowly quit going.  Quit doing the things  that he needed to do to stay healthy, endorsed decreased self-esteem.  Was married once, once when he was 80.  Living by himself.  Disability  due to bipolar disorder.  Binges on cocaine.  Has been on Effexor 112.5  per day and Klonopin 1 mg one-half twice a day and at bedtime.  He was  informed that he was also going to be detoxed from Klonopin.  Was not  too happy, but was educated in terms of the class of medication that  Klonopin was and the need to abstain from benzodiazepines in alcohol-  dependent population.  Endorsed that when he was given the Klonopin, he  did not tell Dr. __________  about his alcohol use.  Sleep was an issue.  We tried Rozerem.  We continued to detox.  Endorsed that as the detox  continued, he was having more of a clear head.  Continued to work  relapse prevention plan.  We started Campral, continued to be better by  April 14.  Some agitation in the afternoon, not sure what that was all  about.  Was going to continue to monitor himself, was going to AA, was  going to work with a sponsor.  Endorsed he could not afford to go to  residential treatment program.  He continued to improve.  He had some  bad dreams and nightmares, said it  was secondary to the sleeping aid.  On April 16, some anxiety, but better.  Sleep was better on the night  before.  Mood improved, affect was brighter, and on April 17, he was in  full contact of reality.  There were no active suicidal or homicidal  ideas, no hallucinations or delusions.  Committed to abstinence.  Objectively better.  Affect was brighter, so we went ahead and  discharged to outpatient followup.   DISCHARGE DIAGNOSES:  AXIS I: Alcohol dependence, cocaine abuse,  depressive disorder, not otherwise specified.  AXIS II: No diagnosis.  AXIS III:  Hepatitis C, bone spurs left shoulder.  AXIS IV: Moderate.  AXIS V:  On discharge 50.   DISCHARGE MEDICATIONS:  1. Effexor 112.5 mg per day.  2. Campral 333 two 3 times a day.  3. Remeron SolTab 30 mg at bedtime.   FOLLOWUP:  [facility].      Geoffery Lyons, M.D.  Electronically Signed     IL/MEDQ  D:  03/22/2007  T:  03/23/2007  Job:  [id number]

## 2011-03-27 NOTE — Op Note (Signed)
Patrick [name], Patrick [name]               ACCOUNT NO.:  [id number]   MEDICAL RECORD NO.:  [id number]          PATIENT TYPE:  AMB   LOCATION:  DAY                           FACILITY:  [facility]   PHYSICIAN:  R. Roetta Sessions, M.D. DATE OF BIRTH:  09-05-1951   DATE OF PROCEDURE:  10/20/2005  DATE OF DISCHARGE:                                 OPERATIVE REPORT   PROCEDURE:  Colonoscopy with biopsy.   INDICATIONS FOR PROCEDURE:  The patient is a 60 year old gentleman sent over  at the courtesy of Dr. Lysbeth Galas for colorectal cancer screening. He is devoid  of any lower GI tract symptoms. He has never had a colonoscopy. There is no  family history of colorectal neoplasia. Colonoscopy is now being done as a  screening maneuver. This approach has been discussed with the patient at  length. Potential risks, benefits, and alternatives have been reviewed and  questions answered. He is agreeable. Please see documentation in the medical  record.   PROCEDURE NOTE:  O2 saturation, blood pressure, pulse, and respirations were  monitored throughout the entire procedure. Conscious sedation with Versed 6  mg IV and Demerol 150 mg IV in divided doses.   INSTRUMENT:  Olympus video chip system.   FINDINGS:  Digital rectal exam revealed no abnormalities.   ENDOSCOPIC FINDINGS:  Prep was good.   Rectum:  Examination of the rectal mucosa including retroflexed view of the  anal verge revealed internal hemorrhoids and a single anal papilla.  Otherwise rectal mucosa appeared normal.   Colon:  Colonic mucosa was surveyed from the rectosigmoid junction through  the left, transverse, and right colon to the area of the appendiceal  orifice, ileocecal valve, and cecum. These structures were well seen and  photographed for the record. From this level, the scope was slowly  withdrawn, and all previously mentioned mucosal surfaces were again seen.  The patient had a 3-mm diminutive polyp mid descending colon. The remainder  of colonic mucosa appeared normal. This polyp was cold biopsied/removed. The  patient tolerated the procedure well and was reactive to endoscopy.   IMPRESSION:  1.  Anal papilla, internal hemorrhoids. Otherwise normal rectum.  2.  Diminutive polyp, mid descending colon, removed with cold biopsy      technique. The remainder of the colonic mucosa appeared normal.   RECOMMENDATIONS:  1.  Follow up on pathology.  2.  Further recommendations to follow.      Jonathon Bellows, M.D.  Electronically Signed     RMR/MEDQ  D:  10/20/2005  T:  10/20/2005  Job:  [id number]   cc:   Delaney Meigs, M.D.  Fax: [phone]

## 2011-03-27 NOTE — Discharge Summary (Signed)
[facility]  Patient:    Patrick [name], Patrick [name]                      MRN: [id number] Adm. Date:  [id number] Disc. Date: 10/23/00 Attending:  Marlyn Corporal Fabmy                           Discharge Summary  ADMISSION DIAGNOSES: Axis I:     1. Bipolar disorder.             2. Alcohol dependence. Axis II:    Deferred. Axis III:   Hepatitis C by history. Axis IV:    Severe, problems with primary support group. Axis V:     Global assessment of function on admission 35, 75 on discharge.  DISCHARGE DIAGNOSES: Axis I:     1. Bipolar disorder.             2. Alcohol dependence. Axis II:    Deferred. Axis III:   Hepatitis C by history. Axis IV:    Severe, problems with primary support group. Axis V:     Global assessment of function on admission 35, 75 on discharge.  BRIEF HISTORY:  This was the second psychiatric admission for this 60 year old divorced Caucasian male.  He was admitted because of depression and alcohol dependence.  Patient has been drinking 12-packs of beer a day for the last 4 or 5 months and using crack cocaine once or twice a month.  States that he has been depressed since May.  He stopped his medication in April, specifically Paxil and Trazodone, and became depressed.  Patients last drink was on the morning of his admission when he drank 1/2 pint of wine and four beers.  His last use of crack cocaine was approximately a week prior to his hospitalization, using $200 worth of crack.  Patient has a history of hepatitis C diagnosed two years ago, but has not taken any medication for the hepatitis C.  PERTINENT PHYSICAL AND LABORATORY DATA:  Initially, the patient complained of pain in his thoracic dorsal area, but medically he has not been on any medications.  Primary physician is Dr. Colon Flattery.  Laboratory work included normal values on the CBC.  On the CMET, AST was elevated at 113 and ALT at 169.  Thyroid profile was unremarkable.  Urine  drug screen was positive for benzodiazepines and cocaine metabolites.  Urinalysis was normal.  COURSE IN HOSPITAL:  Patient was detoxified on a high-dose Librium protocol and his detoxification progressed unremarkably.  He did not show evidence of any minor or major withdrawal symptoms.  Subsequently, he was stabilized on lithium carbonate 300 mg t.i.d. and Effexor XR 75 mg daily, with benefit. Lithium levels have been drawn and are pending, but the patient does not show any evidence of lithium toxicity at this point.  Patient was followed daily for medication management and psychotherapy and at this point he shows evidence of a more stable affect and motivation to abstain from drinking and using crack cocaine.  CONDITION ON DISCHARGE:  The patients affect is full, he is interactive, he is motivated to abstain and understands he needs to attend AA 90 meetings in 90 days.  Also, he has been referred to Dr. Kinnie Scales for treatment of his hepatitis C and Dr. Kinnie Scales is supposed to get in touch with him next week.  MEDICATION AND FOLLOW UP:  Patient is discharged with prescription for lithium  carbonate 300 mg t.i.d. and Effexor XR 75 mg q.d.  He is to follow at my office.  He is to call me should a crisis arise.  He is advised to abstain from drinking and alcoholic beverages and street drugs.  Return to work Monday. DD:  10/23/00 TD:  10/23/00 Job: [id number]

## 2015-01-05 ENCOUNTER — Emergency Department (HOSPITAL_COMMUNITY): Payer: Medicare Other

## 2015-01-05 ENCOUNTER — Encounter (HOSPITAL_COMMUNITY): Payer: Self-pay | Admitting: Emergency Medicine

## 2015-01-05 ENCOUNTER — Inpatient Hospital Stay (HOSPITAL_COMMUNITY)
Admission: EM | Admit: 2015-01-05 | Discharge: 2015-01-06 | DRG: 443 | Disposition: A | Discharge status: Home / Self Care | Payer: Medicare Other | Attending: Internal Medicine | Admitting: Internal Medicine

## 2015-01-05 DIAGNOSIS — K7460 Unspecified cirrhosis of liver: Secondary | ICD-10-CM | POA: Diagnosis present

## 2015-01-05 DIAGNOSIS — K7290 Hepatic failure, unspecified without coma: Principal | ICD-10-CM | POA: Diagnosis present

## 2015-01-05 DIAGNOSIS — F17210 Nicotine dependence, cigarettes, uncomplicated: Secondary | ICD-10-CM | POA: Diagnosis present

## 2015-01-05 DIAGNOSIS — G629 Polyneuropathy, unspecified: Secondary | ICD-10-CM | POA: Diagnosis present

## 2015-01-05 DIAGNOSIS — G4700 Insomnia, unspecified: Secondary | ICD-10-CM | POA: Diagnosis present

## 2015-01-05 DIAGNOSIS — Z8673 Personal history of transient ischemic attack (TIA), and cerebral infarction without residual deficits: Secondary | ICD-10-CM

## 2015-01-05 DIAGNOSIS — K7682 Hepatic encephalopathy: Secondary | ICD-10-CM | POA: Diagnosis present

## 2015-01-05 DIAGNOSIS — F319 Bipolar disorder, unspecified: Secondary | ICD-10-CM | POA: Diagnosis present

## 2015-01-05 DIAGNOSIS — D638 Anemia in other chronic diseases classified elsewhere: Secondary | ICD-10-CM | POA: Diagnosis present

## 2015-01-05 DIAGNOSIS — R4182 Altered mental status, unspecified: Secondary | ICD-10-CM | POA: Diagnosis present

## 2015-01-05 DIAGNOSIS — B1920 Unspecified viral hepatitis C without hepatic coma: Secondary | ICD-10-CM | POA: Diagnosis present

## 2015-01-05 DIAGNOSIS — R531 Weakness: Secondary | ICD-10-CM

## 2015-01-05 HISTORY — DX: Depression, unspecified: F32.A

## 2015-01-05 HISTORY — DX: Unspecified viral hepatitis C without hepatic coma: B19.20

## 2015-01-05 HISTORY — DX: Cardiac murmur, unspecified: R01.1

## 2015-01-05 HISTORY — DX: Unspecified cirrhosis of liver: K74.60

## 2015-01-05 HISTORY — DX: Bipolar disorder, unspecified: F31.9

## 2015-01-05 HISTORY — DX: Cerebral infarction, unspecified: I63.9

## 2015-01-05 HISTORY — DX: Anxiety disorder, unspecified: F41.9

## 2015-01-05 HISTORY — DX: Major depressive disorder, single episode, unspecified: F32.9

## 2015-01-05 HISTORY — DX: Other psychoactive substance abuse, uncomplicated: F19.10

## 2015-01-05 LAB — COMPREHENSIVE METABOLIC PANEL
ALK PHOS: 119 U/L — AB (ref 39–117)
ALT: 17 U/L (ref 0–53)
AST: 23 U/L (ref 0–37)
Albumin: 3 g/dL — ABNORMAL LOW (ref 3.5–5.2)
Anion gap: 3 — ABNORMAL LOW (ref 5–15)
BILIRUBIN TOTAL: 0.7 mg/dL (ref 0.3–1.2)
BUN: 10 mg/dL (ref 6–23)
CO2: 24 mmol/L (ref 19–32)
CREATININE: 1.1 mg/dL (ref 0.50–1.35)
Calcium: 8.6 mg/dL (ref 8.4–10.5)
Chloride: 109 mmol/L (ref 96–112)
GFR calc non Af Amer: 71 mL/min — ABNORMAL LOW (ref >90)
GFR, EST AFRICAN AMERICAN: 82 mL/min — AB (ref >90)
Glucose, Bld: 98 mg/dL (ref 70–99)
Potassium: 3.6 mmol/L (ref 3.5–5.1)
Sodium: 136 mmol/L (ref 135–145)
TOTAL PROTEIN: 6.7 g/dL (ref 6.0–8.3)

## 2015-01-05 LAB — CBC
HCT: 34.4 % — ABNORMAL LOW (ref 39.0–52.0)
HCT: 35.7 % — ABNORMAL LOW (ref 39.0–52.0)
HEMOGLOBIN: 12 g/dL — AB (ref 13.0–17.0)
Hemoglobin: 11.4 g/dL — ABNORMAL LOW (ref 13.0–17.0)
MCH: 28.1 pg (ref 26.0–34.0)
MCH: 28.4 pg (ref 26.0–34.0)
MCHC: 33.1 g/dL (ref 30.0–36.0)
MCHC: 33.6 g/dL (ref 30.0–36.0)
MCV: 84.9 fL (ref 78.0–100.0)
MCV: 84.4 fL (ref 78.0–100.0)
PLATELETS: 40 10*3/uL — AB (ref 150–400)
PLATELETS: 47 10*3/uL — AB (ref 150–400)
RBC: 4.05 MIL/uL — ABNORMAL LOW (ref 4.22–5.81)
RBC: 4.23 MIL/uL (ref 4.22–5.81)
RDW: 17.7 % — ABNORMAL HIGH (ref 11.5–15.5)
RDW: 17.5 % — ABNORMAL HIGH (ref 11.5–15.5)
WBC: 3.5 10*3/uL — ABNORMAL LOW (ref 4.0–10.5)
WBC: 4 10*3/uL (ref 4.0–10.5)

## 2015-01-05 LAB — DIFFERENTIAL
BASOS PCT: 0 % (ref 0–1)
Basophils Absolute: 0 10*3/uL (ref 0.0–0.1)
Eosinophils Absolute: 0.2 10*3/uL (ref 0.0–0.7)
Eosinophils Relative: 5 % (ref 0–5)
LYMPHS ABS: 1 10*3/uL (ref 0.7–4.0)
LYMPHS PCT: 28 % (ref 12–46)
MONOS PCT: 15 % — AB (ref 3–12)
Monocytes Absolute: 0.5 10*3/uL (ref 0.1–1.0)
NEUTROS ABS: 1.8 10*3/uL (ref 1.7–7.7)
Neutrophils Relative %: 52 % (ref 43–77)
Smear Review: DECREASED

## 2015-01-05 LAB — CREATININE, SERUM
Creatinine, Ser: 1.07 mg/dL (ref 0.50–1.35)
GFR calc Af Amer: 85 mL/min — ABNORMAL LOW (ref >90)
GFR calc non Af Amer: 73 mL/min — ABNORMAL LOW (ref >90)

## 2015-01-05 LAB — PROTIME-INR
INR: 1.38 (ref 0.00–1.49)
PROTHROMBIN TIME: 17.1 s — AB (ref 11.6–15.2)

## 2015-01-05 LAB — APTT: aPTT: 37 seconds (ref 24–37)

## 2015-01-05 LAB — LACTIC ACID, PLASMA: LACTIC ACID, VENOUS: 1.1 mmol/L (ref 0.5–2.0)

## 2015-01-05 LAB — TROPONIN I: Troponin I: <0.03 ng/mL (ref <0.031)

## 2015-01-05 LAB — SEDIMENTATION RATE: Sed Rate: 4 mm/hr (ref 0–16)

## 2015-01-05 LAB — AMMONIA: AMMONIA: 83 umol/L — AB (ref 11–32)

## 2015-01-05 LAB — ETHANOL: Alcohol, Ethyl (B): <5 mg/dL (ref 0–9)

## 2015-01-05 MED ORDER — ROPINIROLE HCL 1 MG PO TABS
1.0000 mg | ORAL_TABLET | Freq: Two times a day (BID) | ORAL | Status: DC | PRN
  Filled 2015-01-05: qty 1

## 2015-01-05 MED ORDER — HEPARIN SODIUM (PORCINE) 5000 UNIT/ML IJ SOLN
5000.0000 [IU] | Freq: Three times a day (TID) | INTRAMUSCULAR | Status: DC
  Administered 2015-01-05 – 2015-01-06 (×3): 5000 [IU] via SUBCUTANEOUS
  Filled 2015-01-05 (×3): qty 1

## 2015-01-05 MED ORDER — LACTULOSE 10 GM/15ML PO SOLN: 30.0000 g | Freq: Three times a day (TID) | ORAL | Status: DC | PRN

## 2015-01-05 MED ORDER — RIFAXIMIN 200 MG PO TABS
ORAL_TABLET | ORAL | Status: AC
  Filled 2015-01-05: qty 1

## 2015-01-05 MED ORDER — SODIUM CHLORIDE 0.9 % IV SOLN
INTRAVENOUS | Status: DC
  Administered 2015-01-05: 21:00:00 via INTRAVENOUS

## 2015-01-05 MED ORDER — TRAZODONE HCL 50 MG PO TABS
150.0000 mg | ORAL_TABLET | Freq: Every evening | ORAL | Status: DC | PRN
  Administered 2015-01-05: 150 mg via ORAL
  Filled 2015-01-05: qty 3

## 2015-01-05 MED ORDER — SPIRONOLACTONE 25 MG PO TABS
50.0000 mg | ORAL_TABLET | Freq: Two times a day (BID) | ORAL | Status: DC
  Administered 2015-01-05 – 2015-01-06 (×2): 50 mg via ORAL
  Filled 2015-01-05 (×2): qty 1
  Filled 2015-01-05 (×2): qty 2
  Filled 2015-01-05 (×2): qty 1

## 2015-01-05 MED ORDER — ESCITALOPRAM OXALATE 10 MG PO TABS
20.0000 mg | ORAL_TABLET | Freq: Every day | ORAL | Status: DC
  Filled 2015-01-05: qty 1

## 2015-01-05 MED ORDER — GABAPENTIN 300 MG PO CAPS
300.0000 mg | ORAL_CAPSULE | Freq: Three times a day (TID) | ORAL | Status: DC
  Administered 2015-01-05 – 2015-01-06 (×2): 300 mg via ORAL
  Filled 2015-01-05 (×2): qty 1

## 2015-01-05 MED ORDER — LAMOTRIGINE 200 MG PO TABS
200.0000 mg | ORAL_TABLET | Freq: Every day | ORAL | Status: DC
Start: 2015-01-05 — End: 2015-01-06
  Administered 2015-01-05: 200 mg via ORAL
  Filled 2015-01-05 (×2): qty 1

## 2015-01-05 MED ORDER — SODIUM CHLORIDE 0.9 % IV SOLN: INTRAVENOUS | Status: AC

## 2015-01-05 MED ORDER — IBUPROFEN 800 MG PO TABS
800.0000 mg | ORAL_TABLET | Freq: Once | ORAL | Status: AC
  Administered 2015-01-05: 800 mg via ORAL
  Filled 2015-01-05: qty 1

## 2015-01-05 MED ORDER — ONDANSETRON HCL 4 MG/2ML IJ SOLN: 4.0000 mg | Freq: Four times a day (QID) | INTRAMUSCULAR | Status: DC | PRN

## 2015-01-05 MED ORDER — ACETAMINOPHEN 325 MG PO TABS
650.0000 mg | ORAL_TABLET | Freq: Once | ORAL | Status: DC
  Filled 2015-01-05: qty 2

## 2015-01-05 MED ORDER — LURASIDONE HCL 40 MG PO TABS
40.0000 mg | ORAL_TABLET | Freq: Every day | ORAL | Status: DC
  Administered 2015-01-06: 40 mg via ORAL
  Filled 2015-01-05 (×5): qty 1

## 2015-01-05 MED ORDER — RIFAXIMIN 200 MG PO TABS
200.0000 mg | ORAL_TABLET | Freq: Three times a day (TID) | ORAL | Status: DC
  Administered 2015-01-05 – 2015-01-06 (×2): 200 mg via ORAL
  Filled 2015-01-05 (×8): qty 1

## 2015-01-05 MED ORDER — ONDANSETRON HCL 4 MG PO TABS: 4.0000 mg | ORAL_TABLET | Freq: Four times a day (QID) | ORAL | Status: DC | PRN

## 2015-01-05 MED ORDER — LACTULOSE 10 GM/15ML PO SOLN
30.0000 g | Freq: Once | ORAL | Status: AC
  Administered 2015-01-05: 30 g via ORAL
  Filled 2015-01-05: qty 60

## 2015-01-05 MED ORDER — LAMOTRIGINE 25 MG PO TABS
ORAL_TABLET | ORAL | Status: AC
  Filled 2015-01-05: qty 4

## 2015-01-05 NOTE — ED Notes (Signed)
Attempted to call report. RN unavailable, requested report be called to oncoming shift at [place] per unit secretary.

## 2015-01-05 NOTE — ED Provider Notes (Signed)
CSN: [id number]     Arrival date & time 01/05/15  1529 History   First MD Initiated Contact with Patient 01/05/15 1532     Chief Complaint  Patient presents with  . Weakness     (Consider location/radiation/quality/duration/timing/severity/associated sxs/prior Treatment) HPI Comments: Patient from home with altered mental status, gradual onset headache, difficulty speaking, difficulty walking since 9 AM yesterday. Last seen normal was 2 nights ago. History of cirrhosis with elevated ammonia levels and similar presentation in the past. Also questionable mini strokes in the past. Patient endorses left-sided headache that onset gradually and is worsening. Associated with generalized weakness. Endorses slurred speech and difficulty walking. No fever. No visual change. No chest pain or shortness of breath. No abdominal pain, nausea or vomiting.  The history is provided by the patient and the EMS personnel. The history is limited by the condition of the patient.    Past Medical History  Diagnosis Date  . Stroke   . Heart murmur   . Cirrhosis   . Depression   . Anxiety   . Bipolar 1 disorder   . Polysubstance abuse   . Hepatitis C    Past Surgical History  Procedure Laterality Date  . Arm surgery     No family history on file. History  Substance Use Topics  . Smoking status: Current Every Day Smoker -- 0.75 packs/day    Types: Cigarettes  . Smokeless tobacco: Not on file  . Alcohol Use: No    Review of Systems  Constitutional: Positive for activity change, appetite change and fatigue. Negative for fever.  Eyes: Negative for visual disturbance.  Respiratory: Negative for cough, chest tightness and shortness of breath.   Cardiovascular: Negative for chest pain.  Gastrointestinal: Negative for nausea, vomiting and abdominal pain.  Genitourinary: Negative for dysuria, urgency and hematuria.  Musculoskeletal: Negative for myalgias and arthralgias.  Skin: Negative for wound.   Neurological: Positive for dizziness, speech difficulty, weakness and headaches.  A complete 10 system review of systems was obtained and all systems are negative except as noted in the HPI and PMH.       Allergies  Review of patient's allergies indicates no known allergies.  Home Medications   Prior to Admission medications   Medication Sig Start Date End Date Taking? Authorizing Provider  calcium-vitamin D (OSCAL WITH D) 500-200 MG-UNIT per tablet Take 1 tablet by mouth daily.   Yes Historical Provider, MD  escitalopram (LEXAPRO) 20 MG tablet Take 20 mg by mouth at bedtime.   Yes Historical Provider, MD  gabapentin (NEURONTIN) 300 MG capsule Take 300 mg by mouth 3 (three) times daily.   Yes Historical Provider, MD  hydrOXYzine (ATARAX/VISTARIL) 25 MG tablet Take 25 mg by mouth 2 (two) times daily.   Yes Historical Provider, MD  lamoTRIgine (LAMICTAL) 200 MG tablet Take 200 mg by mouth at bedtime.   Yes Historical Provider, MD  lurasidone (LATUDA) 40 MG TABS tablet Take 40 mg by mouth daily. Takes medication at 5pm   Yes Historical Provider, MD  rOPINIRole (REQUIP) 1 MG tablet Take 1 mg by mouth at bedtime.   Yes Historical Provider, MD  spironolactone (ALDACTONE) 50 MG tablet Take 50 mg by mouth 2 (two) times daily.   Yes Historical Provider, MD  traZODone (DESYREL) 150 MG tablet Take 150 mg by mouth at bedtime.   Yes Historical Provider, MD   BP 124/78 mmHg  Pulse 62  Temp(Src) 98.4 F (36.9 C)  Resp 14  Ht 5\' 8"  (  1.727 m)  Wt 182 lb 12.2 oz (82.9 kg)  BMI 27.80 kg/m2  SpO2 92% Physical Exam  Constitutional: He appears well-developed and well-nourished. No distress.  HENT:  Head: Normocephalic and atraumatic.  Mouth/Throat: Oropharynx is clear and moist. No oropharyngeal exudate.  Left sided scalp tenderness, no palpable temporal artery  Eyes: Conjunctivae and EOM are normal. Pupils are equal, round, and reactive to light.  Neck: Normal range of motion. Neck supple.  No  meningismus.  Cardiovascular: Normal rate, regular rhythm, normal heart sounds and intact distal pulses.   No murmur heard. Pulmonary/Chest: Effort normal and breath sounds normal. No respiratory distress.  Abdominal: Soft. There is no tenderness. There is no rebound and no guarding.  Musculoskeletal: Normal range of motion. He exhibits no edema or tenderness.  Neurological: He is alert. No cranial nerve deficit. He exhibits normal muscle tone. Coordination normal.  Poor effort on neuro exam with global weakness throughout\ Oriented 1 No asterixis  No ataxia on finger to nose bilaterally. No pronator drift. 4/5 strength throughout. CN 2-12 intact Equal grip strength. Sensation intact.   Skin: Skin is warm.  Psychiatric: He has a normal mood and affect. His behavior is normal.  Nursing note and vitals reviewed.   ED Course  Procedures (including critical care time) Labs Review Labs Reviewed  PROTIME-INR - Abnormal; Notable for the following:    Prothrombin Time 17.1 (*)    All other components within normal limits  CBC - Abnormal; Notable for the following:    WBC 3.5 (*)    RBC 4.05 (*)    Hemoglobin 11.4 (*)    HCT 34.4 (*)    RDW 17.7 (*)    Platelets 40 (*)    All other components within normal limits  DIFFERENTIAL - Abnormal; Notable for the following:    Monocytes Relative 15 (*)    All other components within normal limits  COMPREHENSIVE METABOLIC PANEL - Abnormal; Notable for the following:    Albumin 3.0 (*)    Alkaline Phosphatase 119 (*)    GFR calc non Af Amer 71 (*)    GFR calc Af Amer 82 (*)    Anion gap 3 (*)    All other components within normal limits  AMMONIA - Abnormal; Notable for the following:    Ammonia 83 (*)    All other components within normal limits  ETHANOL  APTT  TROPONIN I  SEDIMENTATION RATE  URINE RAPID DRUG SCREEN (HOSP PERFORMED)  URINALYSIS, ROUTINE W REFLEX MICROSCOPIC  LAMOTRIGINE LEVEL  LACTIC ACID, PLASMA  LACTIC ACID,  PLASMA  CBC  CREATININE, SERUM  CBC  COMPREHENSIVE METABOLIC PANEL  CBG MONITORING, ED  POC OCCULT BLOOD, ED    Imaging Review Dg Chest 1 View  01/05/2015   CLINICAL DATA:  Generalized weakness.  EXAM: CHEST  1 VIEW  COMPARISON:  08/23/2010  FINDINGS: Mild hyperinflation. Heart and mediastinal contours are within normal limits. No focal opacities or effusions. No acute bony abnormality.  IMPRESSION: No active disease.   Electronically Signed   By: Rolm Baptise M.D.   On: 01/05/2015 17:17   Ct Head Wo Contrast  01/05/2015   CLINICAL DATA:  Left-sided weakness  EXAM: CT HEAD WITHOUT CONTRAST  TECHNIQUE: Contiguous axial images were obtained from the base of the skull through the vertex without intravenous contrast.  COMPARISON:  09/10/2010  FINDINGS: Motion degraded images.  No evidence of parenchymal hemorrhage or extra-axial fluid collection. No mass lesion, mass effect, or midline shift.  No CT evidence of acute infarction.  Mild cortical atrophy.  No ventriculomegaly.  The visualized paranasal sinuses are essentially clear. The mastoid air cells are unopacified.  No evidence of calvarial fracture.  IMPRESSION: Motion degraded images.  No evidence of acute intracranial abnormality.  Mild cortical atrophy.   Electronically Signed   By: Julian Hy M.D.   On: 01/05/2015 17:02     EKG Interpretation   Date/Time:  Saturday January 05 2015 15:30:32 EST Ventricular Rate:  65 PR Interval:  205 QRS Duration: 83 QT Interval:  516 QTC Calculation: 537 R Axis:   64 Text Interpretation:  Sinus rhythm Prolonged QT interval Prolonged QT  Confirmed by Wyvonnia Dusky  MD, Dhiya Smits [phone]) on 01/05/2015 5:52:29 PM      MDM   Final diagnoses:  Hepatic encephalopathy  Altered mental status, unspecified altered mental status type   Altered mental status with confusion, headache weakness since yesterday. Code stroke not activated due to delay in presentation.  CT head negative. Global weakness on  exam without lateralizing deficits.  Patient complains of left-sided headache. No visual change. We'll check sedimentation rate to evaluate for temporal arteritis.  Ammonia elevated at 83. Lactulose given. Hemoglobin dropped to 11. Hemoccult negative.  Mental status change likely due to hepatic encephalopathy. Patient without lateralizing neurological deficit. CVA seems less likely. Patient may need MRI if not improving. Sedimentation rate is normal and no visual change. Temporal arteritis seems less likely.  Admission discussed with Dr. Marily Memos.    Ezequiel Essex, MD 01/05/15 2115

## 2015-01-05 NOTE — H&P (Signed)
[facility] History and Physical  Patrick [name] YPP:[id number] DOB: 1951-02-05 DOA: 01/05/2015  Referring physician: Dr Wyvonnia Dusky - [facility] PCP: No primary care provider on file.   Chief Complaint: Headache and general weakness  HPI: Patrick [name] is a 64 y.o. male  Headache started at 53 AM on 01/04/2015. Last seen normal by family members the night prior. Patient reports headache associated with general weakness and slurred speech, and confusion per patient Denies one-sided weakness, falls, syncope, chest pain, shortness of breath, palpitations, nausea, vomiting, abdominal pain. No bowel movement in more than 3 days which is abnormal for patient. Reports taking lactulose as prescribed. Motrin with some improvement in headache but no improvement in weakness or slurred speech.  Level V caveat: Patient presenting with altered mental status. History provided via ED physician report and minimally by patient.  Review of Systems:  Patient presented with altered mental status and unable to give reliable  review of systems outside of what is stated in history of present illness  Past Medical History  Diagnosis Date  . Stroke   . Heart murmur   . Cirrhosis   . Depression   . Anxiety   . Bipolar 1 disorder   . Polysubstance abuse   . Hepatitis C    Past Surgical History  Procedure Laterality Date  . Arm surgery     Social History:  reports that he has been smoking Cigarettes.  He has been smoking about 0.75 packs per day. He does not have any smokeless tobacco history on file. He reports that he does not drink alcohol or use illicit drugs.  No Known Allergies  No family history on file.  - UNABLE TO GIVE FAMILY HISTORY DUE TO ALTERED MENTAL STATUS -   Prior to Admission medications   Medication Sig Start Date End Date Taking? Authorizing Provider  calcium-vitamin D (OSCAL WITH D) 500-200 MG-UNIT per tablet Take 1 tablet by mouth daily.   Yes Historical Provider, MD  escitalopram  (LEXAPRO) 20 MG tablet Take 20 mg by mouth at bedtime.   Yes Historical Provider, MD  gabapentin (NEURONTIN) 300 MG capsule Take 300 mg by mouth 3 (three) times daily.   Yes Historical Provider, MD  hydrOXYzine (ATARAX/VISTARIL) 25 MG tablet Take 25 mg by mouth 2 (two) times daily.   Yes Historical Provider, MD  lamoTRIgine (LAMICTAL) 200 MG tablet Take 200 mg by mouth at bedtime.   Yes Historical Provider, MD  lurasidone (LATUDA) 40 MG TABS tablet Take 40 mg by mouth daily. Takes medication at 5pm   Yes Historical Provider, MD  rOPINIRole (REQUIP) 1 MG tablet Take 1 mg by mouth at bedtime.   Yes Historical Provider, MD  spironolactone (ALDACTONE) 50 MG tablet Take 50 mg by mouth 2 (two) times daily.   Yes Historical Provider, MD  traZODone (DESYREL) 150 MG tablet Take 150 mg by mouth at bedtime.   Yes Historical Provider, MD   Physical Exam: Filed Vitals:   01/05/15 1800 01/05/15 1805 01/05/15 1830 01/05/15 1900  BP: 124/72  109/67 110/69  Pulse: 63  66 57  Temp:  98.4 F (36.9 C)    Resp: 14  16 19   Height:      Weight:      SpO2: 93%  91% 91%    Wt Readings from Last 3 Encounters:  01/05/15 82.555 kg (182 lb)    General: Sleepy , Appears calm and comfortable Eyes: PERRL, normal lids, irises & conjunctiva ENT: Dry mucous membranes  Neck:  no LAD, masses or thyromegaly Cardiovascular:  RRR, 3/6 systolic murmur. Trace LE edema. Telemetry: SR, no arrhythmias  Respiratory:   CTA bilaterally, no w/r/r. Normal respiratory effort. Abdomen:  soft, ntnd Skin:  no rash or induration seen on limited exam Musculoskeletal:  grossly normal tone BUE/BLE Psychiatric: Patient responds pleasantly to questions and is able to participate but is oriented to person and intermittently to place. Patient very drowsy on exam. Neurologic: Moves all extremities and corticated fashion. No facial droop. Patient follows commands          Labs on Admission:  Basic Metabolic Panel:  Recent Labs Lab  01/05/15 1550  NA 136  K 3.6  CL 109  CO2 24  GLUCOSE 98  BUN 10  CREATININE 1.10  CALCIUM 8.6   Liver Function Tests:  Recent Labs Lab 01/05/15 1550  AST 23  ALT 17  ALKPHOS 119*  BILITOT 0.7  PROT 6.7  ALBUMIN 3.0*   No results for input(s): LIPASE, AMYLASE in the last 168 hours.  Recent Labs Lab 01/05/15 1549  AMMONIA 83*   CBC:  Recent Labs Lab 01/05/15 1550  WBC 3.5*  NEUTROABS 1.8  HGB 11.4*  HCT 34.4*  MCV 84.9  PLT 40*   Cardiac Enzymes:  Recent Labs Lab 01/05/15 1550  TROPONINI <0.03    BNP (last 3 results) No results for input(s): BNP in the last 8760 hours.  ProBNP (last 3 results) No results for input(s): PROBNP in the last 8760 hours.  CBG: No results for input(s): GLUCAP in the last 168 hours.  Radiological Exams on Admission: Dg Chest 1 View  01/05/2015   CLINICAL DATA:  Generalized weakness.  EXAM: CHEST  1 VIEW  COMPARISON:  08/23/2010  FINDINGS: Mild hyperinflation. Heart and mediastinal contours are within normal limits. No focal opacities or effusions. No acute bony abnormality.  IMPRESSION: No active disease.   Electronically Signed   By: Rolm Baptise M.D.   On: 01/05/2015 17:17   Ct Head Wo Contrast  01/05/2015   CLINICAL DATA:  Left-sided weakness  EXAM: CT HEAD WITHOUT CONTRAST  TECHNIQUE: Contiguous axial images were obtained from the base of the skull through the vertex without intravenous contrast.  COMPARISON:  09/10/2010  FINDINGS: Motion degraded images.  No evidence of parenchymal hemorrhage or extra-axial fluid collection. No mass lesion, mass effect, or midline shift.  No CT evidence of acute infarction.  Mild cortical atrophy.  No ventriculomegaly.  The visualized paranasal sinuses are essentially clear. The mastoid air cells are unopacified.  No evidence of calvarial fracture.  IMPRESSION: Motion degraded images.  No evidence of acute intracranial abnormality.  Mild cortical atrophy.   Electronically Signed   By:  Julian Hy M.D.   On: 01/05/2015 17:02    EKG: Independently reviewed. Sinus, QT 516, no sign of ACS  Assessment/Plan Principal Problem:   Hepatic encephalopathy Active Problems:   Bipolar 1 disorder   Insomnia   Anemia, chronic disease   Neuropathy   Cirrhosis   History of stroke  Altered mental status: likely secondary to Hepatic encephalopathy. Ammonia 83. No BM in >3 days per pt. CT without evidence of acute process. No overt sign of infection. History of polysubstance abuse the patient does not endorse this at this time. Lactulose given by ED. No sign of acute stroke (speech not slurred at time of evaluation in ED).  - Lactic acid - Follow-up on UA and UDS - Continue lactulose  -  start Xifaxan  -  IVF  NS 140ml/hr - CMET in am - Bedside swallow - MRI if not improving  Cirrhosis: MEKD score 11. Secondary to hepatitis C. Chronic condition for patient. - Continue home spironolactone - Consider further management tools as outpatient  Bipolar: Unable to assess stability of his condition at this time. - Continue home Lexapro, Latuda, Requip PRN today (due to AMS) - Lamictal level. Continue Lamictal   - Hold home Atarax  Anemia: Hemoglobin 11.4. Previous normal 14.1 in 2012. Likely secondary to chronic disease - CBC in the a.m.  Insomnia: Likely complicated secondary to bipolar - Continue trazod PRN   Peripheral neuropathy:  - continue neurontin    Code Status: FULL DVT Prophylaxis: Hep Family Communication: None Disposition Plan: pending improvement  MERRELL, DAVID Lenna Sciara, MD Family Medicine [facility] [email or web address] Password [email or web address]

## 2015-01-05 NOTE — ED Notes (Signed)
Per EMS-Pt and family report ha and increasing left side weakness since 0900 yesterday. Pt reports 2 "small strokes" in the last 6 weeks. Pt also has h/s liver problems with elevated ammonia levels. Pt alert and oriented x 4 with generalized weakness. C/o ha to left forehead. nad noted.

## 2015-01-06 ENCOUNTER — Encounter (HOSPITAL_COMMUNITY): Payer: Self-pay | Admitting: *Deleted

## 2015-01-06 LAB — CBC
HCT: 32.2 % — ABNORMAL LOW (ref 39.0–52.0)
Hemoglobin: 10.6 g/dL — ABNORMAL LOW (ref 13.0–17.0)
MCH: 27.9 pg (ref 26.0–34.0)
MCHC: 32.9 g/dL (ref 30.0–36.0)
MCV: 84.7 fL (ref 78.0–100.0)
Platelets: 40 10*3/uL — ABNORMAL LOW (ref 150–400)
RBC: 3.8 MIL/uL — ABNORMAL LOW (ref 4.22–5.81)
RDW: 17.5 % — ABNORMAL HIGH (ref 11.5–15.5)
WBC: 3.9 10*3/uL — ABNORMAL LOW (ref 4.0–10.5)

## 2015-01-06 LAB — COMPREHENSIVE METABOLIC PANEL
ALT: 14 U/L (ref 0–53)
AST: 20 U/L (ref 0–37)
Albumin: 2.6 g/dL — ABNORMAL LOW (ref 3.5–5.2)
Alkaline Phosphatase: 74 U/L (ref 39–117)
Anion gap: 1 — ABNORMAL LOW (ref 5–15)
BUN: 13 mg/dL (ref 6–23)
CALCIUM: 8.1 mg/dL — AB (ref 8.4–10.5)
CHLORIDE: 115 mmol/L — AB (ref 96–112)
CO2: 24 mmol/L (ref 19–32)
CREATININE: 0.97 mg/dL (ref 0.50–1.35)
GFR, EST NON AFRICAN AMERICAN: 87 mL/min — AB (ref >90)
Glucose, Bld: 89 mg/dL (ref 70–99)
Potassium: 3.8 mmol/L (ref 3.5–5.1)
SODIUM: 140 mmol/L (ref 135–145)
Total Bilirubin: 1.3 mg/dL — ABNORMAL HIGH (ref 0.3–1.2)
Total Protein: 5.6 g/dL — ABNORMAL LOW (ref 6.0–8.3)

## 2015-01-06 LAB — LACTIC ACID, PLASMA: LACTIC ACID, VENOUS: 1.3 mmol/L (ref 0.5–2.0)

## 2015-01-06 MED ORDER — LACTULOSE 10 GM/15ML PO SOLN: 30.0000 g | Freq: Two times a day (BID) | ORAL | Status: DC

## 2015-01-06 MED ORDER — RIFAXIMIN 550 MG PO TABS: 550.0000 mg | ORAL_TABLET | Freq: Two times a day (BID) | ORAL | Status: DC

## 2015-01-06 NOTE — Discharge Summary (Signed)
Physician Discharge Summary  Patrick [name] TMA:[id number] DOB: Jun 03, 1951 DOA: 01/05/2015  PCP: No primary care provider on file.  Admit date: 01/05/2015 Discharge date: 01/06/2015  Time spent: 40 minutes  Recommendations for Outpatient Follow-up:  1. Follow-up with primary hepatologist in [place]  Discharge Diagnoses:  Principal Problem:   Hepatic encephalopathy Active Problems:   Bipolar 1 disorder   Insomnia   Anemia, chronic disease   Neuropathy   Cirrhosis   History of stroke   Discharge Condition: Improved  Diet recommendation: Low-salt  Filed Weights   01/05/15 1531 01/05/15 1930  Weight: 82.555 kg (182 lb) 82.9 kg (182 lb 12.2 oz)    History of present illness:  This patient was presented to the hospital yesterday with complaints of headache, generalized weakness and was found to have slurring of speech. He was noted to have elevated ammonia level consistent with hepatic encephalopathy. Patient does have a history of cirrhosis. He was apparently taking his lactulose but had not had a bowel movement in several days. He was admitted for further treatments.  Hospital Course:  Patient was continued on lactulose and was started on Xifaxan. He had several bowel movements during the hospital's mental status has returned to baseline. He no longer has any headache, weakness or slurring of speech. He does not have any asterixis on physical exam. Patient ambulated in the hall without any significant difficulty. He'll be continued on Xifaxan as an outpatient and has been told to adjust his lactulose so that he has 2-3 soft bowel movements every day. The remainder of his medical issues remained stable. He was ready for discharge home.  Procedures:    Consultations:    Discharge Exam: Filed Vitals:   01/06/15 0602  BP: 90/54  Pulse: 68  Temp: 98.1 F (36.7 C)  Resp: 12    General: No acute distress Cardiovascular: S1, S2, regular rate and rhythm Respiratory:  Clear to auscultation bilaterally Neurologic: Awake, alert, oriented 3, speaking in appropriate sentence. No asterixis  Discharge Instructions   Discharge Instructions    Call MD for:  extreme fatigue    Complete by:  As directed      Call MD for:  temperature >100.4    Complete by:  As directed      Diet - low sodium heart healthy    Complete by:  As directed      Increase activity slowly    Complete by:  As directed           Discharge Medication List as of 01/06/2015  4:40 PM    START taking these medications   Details  lactulose (CHRONULAC) 10 GM/15ML solution Take 45 mLs (30 g total) by mouth 2 (two) times daily., Starting 01/06/2015, Until Discontinued, Print    rifaximin (XIFAXAN) 550 MG TABS tablet Take 1 tablet (550 mg total) by mouth 2 (two) times daily., Starting 01/06/2015, Until Discontinued, Print      CONTINUE these medications which have NOT CHANGED   Details  calcium-vitamin D (OSCAL WITH D) 500-200 MG-UNIT per tablet Take 1 tablet by mouth daily., Until Discontinued, Historical Med    escitalopram (LEXAPRO) 20 MG tablet Take 20 mg by mouth at bedtime., Until Discontinued, Historical Med    gabapentin (NEURONTIN) 300 MG capsule Take 300 mg by mouth 3 (three) times daily., Until Discontinued, Historical Med    hydrOXYzine (ATARAX/VISTARIL) 25 MG tablet Take 25 mg by mouth 2 (two) times daily., Until Discontinued, Historical Med    lamoTRIgine (LAMICTAL) 200  MG tablet Take 200 mg by mouth at bedtime., Until Discontinued, Historical Med    lurasidone (LATUDA) 40 MG TABS tablet Take 40 mg by mouth daily. Takes medication at 5pm, Until Discontinued, Historical Med    rOPINIRole (REQUIP) 1 MG tablet Take 1 mg by mouth at bedtime., Until Discontinued, Historical Med    spironolactone (ALDACTONE) 50 MG tablet Take 50 mg by mouth 2 (two) times daily., Until Discontinued, Historical Med    traZODone (DESYREL) 150 MG tablet Take 150 mg by mouth at bedtime., Until  Discontinued, Historical Med       No Known Allergies    The results of significant diagnostics from this hospitalization (including imaging, microbiology, ancillary and laboratory) are listed below for reference.    Significant Diagnostic Studies: Dg Chest 1 View  01/05/2015   CLINICAL DATA:  Generalized weakness.  EXAM: CHEST  1 VIEW  COMPARISON:  08/23/2010  FINDINGS: Mild hyperinflation. Heart and mediastinal contours are within normal limits. No focal opacities or effusions. No acute bony abnormality.  IMPRESSION: No active disease.   Electronically Signed   By: Rolm Baptise M.D.   On: 01/05/2015 17:17   Ct Head Wo Contrast  01/05/2015   CLINICAL DATA:  Left-sided weakness  EXAM: CT HEAD WITHOUT CONTRAST  TECHNIQUE: Contiguous axial images were obtained from the base of the skull through the vertex without intravenous contrast.  COMPARISON:  09/10/2010  FINDINGS: Motion degraded images.  No evidence of parenchymal hemorrhage or extra-axial fluid collection. No mass lesion, mass effect, or midline shift.  No CT evidence of acute infarction.  Mild cortical atrophy.  No ventriculomegaly.  The visualized paranasal sinuses are essentially clear. The mastoid air cells are unopacified.  No evidence of calvarial fracture.  IMPRESSION: Motion degraded images.  No evidence of acute intracranial abnormality.  Mild cortical atrophy.   Electronically Signed   By: Julian Hy M.D.   On: 01/05/2015 17:02    Microbiology: No results found for this or any previous visit (from the past 240 hour(s)).   Labs: Basic Metabolic Panel:  Recent Labs Lab 01/05/15 1550 01/05/15 2151 01/06/15 0636  NA 136  --  140  K 3.6  --  3.8  CL 109  --  115*  CO2 24  --  24  GLUCOSE 98  --  89  BUN 10  --  13  CREATININE 1.10 1.07 0.97  CALCIUM 8.6  --  8.1*   Liver Function Tests:  Recent Labs Lab 01/05/15 1550 01/06/15 0636  AST 23 20  ALT 17 14  ALKPHOS 119* 74  BILITOT 0.7 1.3*  PROT 6.7 5.6*   ALBUMIN 3.0* 2.6*   No results for input(s): LIPASE, AMYLASE in the last 168 hours.  Recent Labs Lab 01/05/15 1549  AMMONIA 83*   CBC:  Recent Labs Lab 01/05/15 1550 01/05/15 2151 01/06/15 0636  WBC 3.5* 4.0 3.9*  NEUTROABS 1.8  --   --   HGB 11.4* 12.0* 10.6*  HCT 34.4* 35.7* 32.2*  MCV 84.9 84.4 84.7  PLT 40* 47* 40*   Cardiac Enzymes:  Recent Labs Lab 01/05/15 1550  TROPONINI <0.03   BNP: BNP (last 3 results) No results for input(s): BNP in the last 8760 hours.  ProBNP (last 3 results) No results for input(s): PROBNP in the last 8760 hours.  CBG: No results for input(s): GLUCAP in the last 168 hours.     Signed:  Francina Beery  [facility] 01/06/2015, 7:13 PM

## 2015-01-07 LAB — OCCULT BLOOD, POC DEVICE: Fecal Occult Bld: NEGATIVE

## 2015-01-08 LAB — LAMOTRIGINE LEVEL: Lamotrigine Lvl: 8.3 ug/mL (ref 2.0–20.0)

## 2015-01-10 NOTE — Care Management Utilization Note (Signed)
UR completed 

## 2017-07-08 ENCOUNTER — Encounter: Payer: Self-pay | Admitting: Family Medicine

## 2017-07-08 ENCOUNTER — Ambulatory Visit (INDEPENDENT_AMBULATORY_CARE_PROVIDER_SITE_OTHER): Payer: Medicare Other | Admitting: Family Medicine

## 2017-07-08 ENCOUNTER — Telehealth: Payer: Self-pay | Admitting: Family Medicine

## 2017-07-08 ENCOUNTER — Ambulatory Visit (INDEPENDENT_AMBULATORY_CARE_PROVIDER_SITE_OTHER): Payer: Medicare Other

## 2017-07-08 VITALS — BP 112/65 | HR 67 | Temp 98.0°F | Ht 68.0 in | Wt 190.0 lb

## 2017-07-08 DIAGNOSIS — F319 Bipolar disorder, unspecified: Secondary | ICD-10-CM | POA: Diagnosis not present

## 2017-07-08 DIAGNOSIS — J439 Emphysema, unspecified: Secondary | ICD-10-CM

## 2017-07-08 DIAGNOSIS — M545 Low back pain, unspecified: Secondary | ICD-10-CM

## 2017-07-08 DIAGNOSIS — Z13220 Encounter for screening for lipoid disorders: Secondary | ICD-10-CM

## 2017-07-08 DIAGNOSIS — J449 Chronic obstructive pulmonary disease, unspecified: Secondary | ICD-10-CM | POA: Insufficient documentation

## 2017-07-08 DIAGNOSIS — K7030 Alcoholic cirrhosis of liver without ascites: Secondary | ICD-10-CM

## 2017-07-08 DIAGNOSIS — K769 Liver disease, unspecified: Secondary | ICD-10-CM | POA: Diagnosis not present

## 2017-07-08 DIAGNOSIS — W108XXA Fall (on) (from) other stairs and steps, initial encounter: Secondary | ICD-10-CM

## 2017-07-08 MED ORDER — LACTULOSE 10 GM/15ML PO SOLN: 30.0000 g | Freq: Two times a day (BID) | ORAL | 3 refills | Status: DC

## 2017-07-08 MED ORDER — PROPRANOLOL HCL 20 MG PO TABS: 20.0000 mg | ORAL_TABLET | Freq: Two times a day (BID) | ORAL | 2 refills | Status: DC

## 2017-07-08 NOTE — Progress Notes (Signed)
BP 112/65   Pulse 67   Temp 98 F (36.7 C) (Oral)   Ht '5\' 8"'$  (1.727 m)   Wt 190 lb (86.2 kg)   BMI 28.89 kg/m    Subjective:    Patient ID: Patrick [name], male    DOB: [birth year], 66 y.o.   MRN: [id number]  HPI: Patrick [name] is a 66 y.o. male presenting on 07/08/2017 for Establish Care   HPI COPD Patient is coming to establish care with our office for COPD and many other illnesses. He says that he has been diagnosed with COPD in the past but denies any major breathing issues and has not had any in the recent past. He says that his breathing has been doing relatively well.  Cirrhosis from alcohol and previous hepatitis Patient has cirrhosis from previous alcoholism and hepatitis and is coming here to establish care with a office. He denies any major swelling issues. He does currently take the lactulose but has been out of it needs [place] to send a prescription for it. He denies any major constipation or diarrhea and has the lactulose for some time. He denies any abdominal pain or major swelling in his abdomen.  Fall and back pain Patient says he has a fall in the past week that he slipped and fell down the stairs onto his back and buttocks as he was sliding down and has been having some low back pain in the center of his back without any radiation or numbness or weakness associated. He denies any loss of bowel or bladder. He rates the pain as a 6 out of 10 and does not radiate anywhere else.  Bipolar Patient has a history of bipolar and is getting a psychiatrist and is currently on medication.  Relevant past medical, surgical, family and social history reviewed and updated as indicated. Interim medical history since our last visit reviewed. Allergies and medications reviewed and updated.  Review of Systems  Constitutional: Negative for chills and fever.  HENT: Negative for congestion.   Eyes: Negative for discharge.  Respiratory: Positive for cough. Negative for shortness of  breath and wheezing.   Cardiovascular: Negative for chest pain and leg swelling.  Gastrointestinal: Negative for abdominal pain, diarrhea, nausea and vomiting.  Musculoskeletal: Positive for back pain and myalgias. Negative for arthralgias and gait problem.  Skin: Negative for color change and rash.  All other systems reviewed and are negative.   Per HPI unless specifically indicated above  Social History   Social History  . Marital status: Divorced    Spouse name: N/A  . Number of children: N/A  . Years of education: N/A   Occupational History  . Not on file.   Social History Main Topics  . Smoking status: Current Every Day Smoker    Packs/day: 0.25    Years: 50.00    Types: Cigarettes  . Smokeless tobacco: Never Used  . Alcohol use No     Comment: 26 months alcohol free  . Drug use: No  . Sexual activity: No   Other Topics Concern  . Not on file   Social History Narrative  . No narrative on file    Past Surgical History:  Procedure Laterality Date  . arm surgery      Family History  Problem Relation Age of Onset  . Arthritis Mother   . Cancer Father        lung  . Diabetes Father   . Mental illness Brother  depression  . Heart disease Paternal Grandmother   . Cancer Paternal Grandmother        skin  . Diabetes Paternal Uncle     Allergies as of 07/08/2017      Reactions   Other    Shellfish   Tramadol Itching      Medication List       Accurate as of 07/08/17 10:28 AM. Always use your most recent med list.          hydrOXYzine 25 MG tablet Commonly known as:  ATARAX/VISTARIL Take 25 mg by mouth 2 (two) times daily.   lactulose 10 GM/15ML solution Commonly known as:  CHRONULAC Take 45 mLs (30 g total) by mouth 2 (two) times daily.   lamoTRIgine 150 MG tablet Commonly known as:  LAMICTAL Take 150 mg by mouth daily.   propranolol 20 MG tablet Commonly known as:  INDERAL Take 1 tablet (20 mg total) by mouth 2 (two) times  daily.   traZODone 100 MG tablet Commonly known as:  DESYREL Take 100 mg by mouth at bedtime.   venlafaxine XR 75 MG 24 hr capsule Commonly known as:  EFFEXOR-XR Take 75 mg by mouth daily with breakfast.            Discharge Care Instructions        Start     Ordered   07/08/17 0000  lactulose (CHRONULAC) 10 GM/15ML solution  2 times daily     07/08/17 1008   07/08/17 0000  DG Lumbar Spine 2-3 Views    Question Answer Comment  Reason for Exam (SYMPTOM  OR DIAGNOSIS REQUIRED) Low back pain/fall 2 weeks ago, continued pain   Preferred imaging location? Internal      07/08/17 1016   07/08/17 0000  propranolol (INDERAL) 20 MG tablet  2 times daily     07/08/17 1027   07/08/17 0000  Ambulatory referral to Gastroenterology    Comments:  Please send him to a liver or cirrhosis specialist, he may need to go to [facility]   07/08/17 1027   07/08/17 0000  CMP14+EGFR     07/08/17 1028   07/08/17 0000  CBC with Differential/Platelet     07/08/17 1028   07/08/17 0000  Ammonia     07/08/17 1028   07/08/17 0000  Lipid panel     07/08/17 1028         Objective:    BP 112/65   Pulse 67   Temp 98 F (36.7 C) (Oral)   Ht '5\' 8"'$  (1.727 m)   Wt 190 lb (86.2 kg)   BMI 28.89 kg/m   Wt Readings from Last 3 Encounters:  07/08/17 190 lb (86.2 kg)  01/05/15 182 lb 12.2 oz (82.9 kg)    Physical Exam  Constitutional: He is oriented to person, place, and time. He appears well-developed and well-nourished. No distress.  Eyes: Conjunctivae are normal. No scleral icterus.  Cardiovascular: Normal rate, regular rhythm, normal heart sounds and intact distal pulses.   No murmur heard. Pulmonary/Chest: Effort normal and breath sounds normal. No respiratory distress. He has no wheezes. He has no rales.  Abdominal: Soft. Bowel sounds are normal. He exhibits distension. He exhibits no fluid wave and no ascites. There is no tenderness. There is no rebound.  Musculoskeletal: Normal range of  motion. He exhibits tenderness (Low back pain, midline). He exhibits no edema.  Neurological: He is alert and oriented to person, place, and time. Coordination normal.  Skin: Skin  is warm and dry. No rash noted. He is not diaphoretic.  Psychiatric: His behavior is normal. Judgment normal. His mood appears anxious. He does not exhibit a depressed mood. He expresses no suicidal ideation. He expresses no suicidal plans.  Nursing note and vitals reviewed.  Lumbar x-ray: No sign of acute fracture or displacement, stable from previous x-ray     Assessment & Plan:   Problem List Items Addressed This Visit      Respiratory   COPD (chronic obstructive pulmonary disease) (HCC)     Digestive   Cirrhosis ([place]) - Primary   Relevant Medications   lactulose (CHRONULAC) 10 GM/15ML solution   propranolol (INDERAL) 20 MG tablet   Other Relevant Orders   Ambulatory referral to Gastroenterology   CMP14+EGFR   CBC with Differential/Platelet   Ammonia     Other   Bipolar 1 disorder ([place])    Other Visit Diagnoses    Fall (on) (from) other stairs and steps, initial encounter       Relevant Orders   DG Lumbar Spine 2-3 Views   Acute midline low back pain without sciatica       Relevant Orders   DG Lumbar Spine 2-3 Views   Child-Pugh class B liver disease score       Relevant Medications   propranolol (INDERAL) 20 MG tablet   Other Relevant Orders   Ambulatory referral to Gastroenterology   Lipid screening       Relevant Orders   Lipid panel       Follow up plan: Return if symptoms worsen or fail to improve.  Caryl Pina, MD [facility] 07/08/2017, 10:28 AM

## 2017-07-08 NOTE — Telephone Encounter (Signed)
Patient aware of results.

## 2017-07-09 ENCOUNTER — Other Ambulatory Visit: Payer: Self-pay

## 2017-07-09 ENCOUNTER — Telehealth: Payer: Self-pay | Admitting: Family Medicine

## 2017-07-09 DIAGNOSIS — D696 Thrombocytopenia, unspecified: Secondary | ICD-10-CM

## 2017-07-09 LAB — CBC WITH DIFFERENTIAL/PLATELET
BASOS: 0 %
Basophils Absolute: 0 10*3/uL (ref 0.0–0.2)
EOS (ABSOLUTE): 0.1 10*3/uL (ref 0.0–0.4)
EOS: 2 %
Hematocrit: 41.1 % (ref 37.5–51.0)
Hemoglobin: 13.5 g/dL (ref 13.0–17.7)
IMMATURE GRANS (ABS): 0 10*3/uL (ref 0.0–0.1)
IMMATURE GRANULOCYTES: 0 %
LYMPHS: 24 %
Lymphocytes Absolute: 0.9 10*3/uL (ref 0.7–3.1)
MCH: 27.7 pg (ref 26.6–33.0)
MCHC: 32.8 g/dL (ref 31.5–35.7)
MCV: 84 fL (ref 79–97)
MONOCYTES: 19 %
Monocytes Absolute: 0.7 10*3/uL (ref 0.1–0.9)
NEUTROS PCT: 55 %
Neutrophils Absolute: 2 10*3/uL (ref 1.4–7.0)
Platelets: 71 10*3/uL — CL (ref 150–379)
RBC: 4.88 x10E6/uL (ref 4.14–5.80)
RDW: 18.9 % — ABNORMAL HIGH (ref 12.3–15.4)
WBC: 3.8 10*3/uL (ref 3.4–10.8)

## 2017-07-09 LAB — CMP14+EGFR
ALT: 12 IU/L (ref 0–44)
AST: 32 IU/L (ref 0–40)
Albumin/Globulin Ratio: 1.1 — ABNORMAL LOW (ref 1.2–2.2)
Albumin: 3.7 g/dL (ref 3.6–4.8)
Alkaline Phosphatase: 146 IU/L — ABNORMAL HIGH (ref 39–117)
BUN/Creatinine Ratio: 12 (ref 10–24)
BUN: 11 mg/dL (ref 8–27)
Bilirubin Total: 1 mg/dL (ref 0.0–1.2)
CALCIUM: 8.6 mg/dL (ref 8.6–10.2)
CO2: 19 mmol/L — AB (ref 20–29)
CREATININE: 0.93 mg/dL (ref 0.76–1.27)
Chloride: 108 mmol/L — ABNORMAL HIGH (ref 96–106)
GFR calc Af Amer: 100 mL/min/{1.73_m2} (ref >59)
GFR, EST NON AFRICAN AMERICAN: 86 mL/min/{1.73_m2} (ref >59)
GLOBULIN, TOTAL: 3.5 g/dL (ref 1.5–4.5)
Glucose: 69 mg/dL (ref 65–99)
Potassium: 4.2 mmol/L (ref 3.5–5.2)
Sodium: 139 mmol/L (ref 134–144)
Total Protein: 7.2 g/dL (ref 6.0–8.5)

## 2017-07-09 LAB — LIPID PANEL
CHOLESTEROL TOTAL: 134 mg/dL (ref 100–199)
Chol/HDL Ratio: 2.1 ratio (ref 0.0–5.0)
HDL: 63 mg/dL (ref >39)
LDL Calculated: 62 mg/dL (ref 0–99)
TRIGLYCERIDES: 44 mg/dL (ref 0–149)
VLDL CHOLESTEROL CAL: 9 mg/dL (ref 5–40)

## 2017-07-09 LAB — AMMONIA: Ammonia: 129 ug/dL (ref 27–102)

## 2017-07-13 ENCOUNTER — Encounter (HOSPITAL_COMMUNITY): Payer: Self-pay | Admitting: Emergency Medicine

## 2017-07-13 ENCOUNTER — Observation Stay (HOSPITAL_COMMUNITY): Admission: RE | Admit: 2017-07-13 | Payer: Medicare Other | Source: Home / Self Care | Admitting: Psychiatry

## 2017-07-13 ENCOUNTER — Emergency Department (EMERGENCY_DEPARTMENT_HOSPITAL)
Admission: EM | Admit: 2017-07-13 | Discharge: 2017-07-14 | Disposition: A | Discharge status: Home / Self Care | Payer: Medicare Other | Source: Home / Self Care | Attending: Emergency Medicine | Admitting: Emergency Medicine

## 2017-07-13 DIAGNOSIS — Z046 Encounter for general psychiatric examination, requested by authority: Secondary | ICD-10-CM | POA: Insufficient documentation

## 2017-07-13 DIAGNOSIS — Z79899 Other long term (current) drug therapy: Secondary | ICD-10-CM

## 2017-07-13 DIAGNOSIS — R45851 Suicidal ideations: Secondary | ICD-10-CM | POA: Insufficient documentation

## 2017-07-13 DIAGNOSIS — F10129 Alcohol abuse with intoxication, unspecified: Secondary | ICD-10-CM | POA: Diagnosis not present

## 2017-07-13 DIAGNOSIS — Z9114 Patient's other noncompliance with medication regimen: Secondary | ICD-10-CM

## 2017-07-13 DIAGNOSIS — R441 Visual hallucinations: Secondary | ICD-10-CM

## 2017-07-13 DIAGNOSIS — F1094 Alcohol use, unspecified with alcohol-induced mood disorder: Secondary | ICD-10-CM | POA: Insufficient documentation

## 2017-07-13 DIAGNOSIS — F17210 Nicotine dependence, cigarettes, uncomplicated: Secondary | ICD-10-CM | POA: Insufficient documentation

## 2017-07-13 DIAGNOSIS — R0789 Other chest pain: Secondary | ICD-10-CM | POA: Diagnosis not present

## 2017-07-13 DIAGNOSIS — J449 Chronic obstructive pulmonary disease, unspecified: Secondary | ICD-10-CM

## 2017-07-13 DIAGNOSIS — K922 Gastrointestinal hemorrhage, unspecified: Secondary | ICD-10-CM | POA: Diagnosis not present

## 2017-07-13 DIAGNOSIS — Y907 Blood alcohol level of 200-239 mg/100 ml: Secondary | ICD-10-CM | POA: Diagnosis not present

## 2017-07-13 LAB — SALICYLATE LEVEL: Salicylate Lvl: <7 mg/dL (ref 2.8–30.0)

## 2017-07-13 LAB — COMPREHENSIVE METABOLIC PANEL
ALBUMIN: 3.5 g/dL (ref 3.5–5.0)
ALT: 26 U/L (ref 17–63)
AST: 54 U/L — AB (ref 15–41)
Alkaline Phosphatase: 132 U/L — ABNORMAL HIGH (ref 38–126)
Anion gap: 11 (ref 5–15)
BILIRUBIN TOTAL: 1.9 mg/dL — AB (ref 0.3–1.2)
BUN: 9 mg/dL (ref 6–20)
CHLORIDE: 107 mmol/L (ref 101–111)
CO2: 18 mmol/L — AB (ref 22–32)
Calcium: 8.6 mg/dL — ABNORMAL LOW (ref 8.9–10.3)
Creatinine, Ser: 0.8 mg/dL (ref 0.61–1.24)
GFR calc Af Amer: >60 mL/min (ref >60)
GFR calc non Af Amer: >60 mL/min (ref >60)
GLUCOSE: 85 mg/dL (ref 65–99)
POTASSIUM: 3.4 mmol/L — AB (ref 3.5–5.1)
SODIUM: 136 mmol/L (ref 135–145)
TOTAL PROTEIN: 7.6 g/dL (ref 6.5–8.1)

## 2017-07-13 LAB — ETHANOL: Alcohol, Ethyl (B): 219 mg/dL — ABNORMAL HIGH (ref <5)

## 2017-07-13 LAB — CBC
HEMATOCRIT: 40.8 % (ref 39.0–52.0)
HEMOGLOBIN: 14.4 g/dL (ref 13.0–17.0)
MCH: 27.9 pg (ref 26.0–34.0)
MCHC: 35.3 g/dL (ref 30.0–36.0)
MCV: 78.9 fL (ref 78.0–100.0)
Platelets: 98 10*3/uL — ABNORMAL LOW (ref 150–400)
RBC: 5.17 MIL/uL (ref 4.22–5.81)
RDW: 17.4 % — ABNORMAL HIGH (ref 11.5–15.5)
WBC: 7.8 10*3/uL (ref 4.0–10.5)

## 2017-07-13 LAB — RAPID URINE DRUG SCREEN, HOSP PERFORMED
AMPHETAMINES: NOT DETECTED
BARBITURATES: NOT DETECTED
BENZODIAZEPINES: NOT DETECTED
COCAINE: POSITIVE — AB
Opiates: NOT DETECTED
TETRAHYDROCANNABINOL: NOT DETECTED

## 2017-07-13 LAB — ACETAMINOPHEN LEVEL: Acetaminophen (Tylenol), Serum: <10 ug/mL — ABNORMAL LOW (ref 10–30)

## 2017-07-13 MED ORDER — TRAZODONE HCL 100 MG PO TABS
100.0000 mg | ORAL_TABLET | Freq: Every day | ORAL | Status: DC
  Administered 2017-07-13: 100 mg via ORAL
  Filled 2017-07-13: qty 1

## 2017-07-13 MED ORDER — LAMOTRIGINE 100 MG PO TABS: 150.0000 mg | ORAL_TABLET | Freq: Every day | ORAL | Status: DC

## 2017-07-13 MED ORDER — HYDROXYZINE HCL 25 MG PO TABS
25.0000 mg | ORAL_TABLET | Freq: Two times a day (BID) | ORAL | Status: DC
  Administered 2017-07-13 – 2017-07-14 (×2): 25 mg via ORAL
  Filled 2017-07-13 (×2): qty 1

## 2017-07-13 MED ORDER — NICOTINE 21 MG/24HR TD PT24: 21.0000 mg | MEDICATED_PATCH | Freq: Every day | TRANSDERMAL | Status: DC

## 2017-07-13 NOTE — ED Notes (Signed)
Thera Flake, ED Provider at bedside.

## 2017-07-13 NOTE — ED Triage Notes (Signed)
Pt comes in with complaints of suicidal thoughts.  Denies HI.  Plan of jumping off of a bridge.  Denies previous attempts.  States he does not take his medicine anymore because it does not work and he refuses to take medication anymore because none of it works and he just wants to die. [facility] would not take patient due to oxygen use of 3L [place] at night. Pt ambulatory and independent at discharge.

## 2017-07-13 NOTE — ED Notes (Signed)
Patient placed on 3L o2. Pt states he sleeps with oxygen at night, o2 sat now 96%

## 2017-07-13 NOTE — ED Notes (Signed)
[facility] called and said he couldn't come to them because they do not have oxygen

## 2017-07-13 NOTE — ED Notes (Signed)
Bed: [facility] Expected date:  Expected time:  Means of arrival:  Comments: Hold for triage 4

## 2017-07-13 NOTE — ED Notes (Signed)
Bed: [facility] Expected date:  Expected time:  Means of arrival:  Comments: 

## 2017-07-13 NOTE — ED Provider Notes (Signed)
[facility] DEPT Provider Note   CSN: [id number] Arrival date & time: 07/13/17  1202     History   Chief Complaint Chief Complaint  Patient presents with  . Alcohol Intoxication  . Suicidal    HPI Patrick [name] is a 66 y.o. male.  HPI 66 yo male pmh sig for COPD currently on 2L of o2 at night, cirrhosis, depression, anxiety, bipolar disorder, polysubstance abuse, alcohol abuse, that presents to the emergency department for evaluation of suicidal ideations. Patient with history of suicidal ideations. Patient endorses plan of jumping off a bridge. Denies previous attempts. States that he is severely depressed and has no reason to live.the patient states that he does not take his medication anymore because it is not working and refuses take medication anymore because none of it works and just wants to die.the patient states that he has been sober for 20 months however started drinking 4 days ago. States that [facility] will not take patient due to oxygen use at night for COPD which is baseline for patient. Patient denies any medical complaints at this time. He endorses some visual hallucinations. States that he sees black figures coming at him and night. Denies any auditory hallucinations. Patient denies any HI. He is a chronic tobacco smoker. States that he drank 16 beers around 7:00 AM.   Patient recently seen by his primary care doctor. Patient has chronic pancytopenia, COPD on oxygen, cirrhosis. He has not followed up with a gi doctor which he was instructed to do. Denies any other drug use.   Pt denies any fever, chill, ha, vision changes, lightheadedness, dizziness, congestion, neck pain, cp, sob, cough, abd pain, n/v/d, urinary symptoms, change in bowel habits, melena, hematochezia, lower extremity paresthesias. ] Past Medical History:  Diagnosis Date  . Anxiety   . Bipolar 1 disorder ([place])   . Cirrhosis ([place])   . COPD (chronic obstructive pulmonary disease) ([place])   . Depression    . Heart murmur   . Hepatitis C   . Oxygen deficiency   . Polysubstance abuse   . Stroke [facility])     Patient Active Problem List   Diagnosis Date Noted  . COPD (chronic obstructive pulmonary disease) ([place]) 07/08/2017  . Hepatic encephalopathy ([place]) 01/05/2015  . Bipolar 1 disorder ([place]) 01/05/2015  . Insomnia 01/05/2015  . Anemia, chronic disease 01/05/2015  . Neuropathy 01/05/2015  . Cirrhosis ([place]) 01/05/2015  . History of stroke 01/05/2015  . Altered mental status     Past Surgical History:  Procedure Laterality Date  . arm surgery         Home Medications    Prior to Admission medications   Medication Sig Start Date End Date Taking? Authorizing Provider  hydrOXYzine (ATARAX/VISTARIL) 25 MG tablet Take 25 mg by mouth 2 (two) times daily.   Yes [provider]  ibuprofen (ADVIL,MOTRIN) 200 MG tablet Take 200-800 mg by mouth every 6 (six) hours as needed for headache or moderate pain.   Yes [provider]  lamoTRIgine (LAMICTAL) 150 MG tablet Take 150 mg by mouth daily.   Yes [provider]  traZODone (DESYREL) 100 MG tablet Take 100 mg by mouth at bedtime.   Yes [provider]  lactulose (CHRONULAC) 10 GM/15ML solution Take 45 mLs (30 g total) by mouth 2 (two) times daily. Patient not taking: Reported on 07/13/2017 07/08/17   Dettinger, Fransisca Kaufmann, MD  propranolol (INDERAL) 20 MG tablet Take 1 tablet (20 mg total) by mouth 2 (two) times  daily. Patient not taking: Reported on 07/13/2017 07/08/17   Dettinger, Fransisca Kaufmann, MD    Family History Family History  Problem Relation Age of Onset  . Arthritis Mother   . Cancer Father        lung  . Diabetes Father   . Mental illness Brother        depression  . Heart disease Paternal Grandmother   . Cancer Paternal Grandmother        skin  . Diabetes Paternal Uncle     Social History Social History  Substance Use Topics  . Smoking status: Current Every Day Smoker    Packs/day: 0.25     Years: 50.00    Types: Cigarettes  . Smokeless tobacco: Never Used  . Alcohol use Yes     Comment: 26 months alcohol free- relapse on 07/10/17     Allergies   Other and Tramadol   Review of Systems Review of Systems  Constitutional: Negative for chills and fever.  HENT: Negative for congestion and sore throat.   Eyes: Negative for visual disturbance.  Respiratory: Negative for cough and shortness of breath.   Cardiovascular: Negative for chest pain.  Gastrointestinal: Negative for abdominal pain, diarrhea, nausea and vomiting.  Genitourinary: Negative for dysuria, flank pain, frequency, hematuria, scrotal swelling, testicular pain and urgency.  Musculoskeletal: Negative for arthralgias and myalgias.  Skin: Negative for rash.  Neurological: Negative for dizziness, syncope, weakness, light-headedness, numbness and headaches.  Psychiatric/Behavioral: Positive for suicidal ideas. Negative for self-injury and sleep disturbance. The patient is not nervous/anxious.      Physical Exam Updated Vital Signs BP 136/79 (BP Location: Left Arm)   Pulse 93   Temp 98.2 F (36.8 C) (Oral)   Resp 18   Ht 5\' 8"  (1.727 m)   Wt 86.2 kg (190 lb)   SpO2 90%   BMI 28.89 kg/m   Physical Exam  Constitutional: He is oriented to person, place, and time. He appears well-developed and well-nourished.  Non-toxic appearance. No distress.  HENT:  Head: Normocephalic and atraumatic.  Mouth/Throat: Oropharynx is clear and moist.  Eyes: Pupils are equal, round, and reactive to light. Conjunctivae are normal. Right eye exhibits no discharge. Left eye exhibits no discharge.  Neck: Normal range of motion. Neck supple.  Cardiovascular: Normal rate, regular rhythm, normal heart sounds and intact distal pulses.  Exam reveals no gallop and no friction rub.   No murmur heard. Pulmonary/Chest: Effort normal and breath sounds normal. No respiratory distress. He has no wheezes. He has no rales. He exhibits no  tenderness.  Abdominal: Soft. Bowel sounds are normal. He exhibits no distension. There is no tenderness. There is no rigidity, no rebound, no guarding, no CVA tenderness, no tenderness at McBurney's point and negative Murphy's sign.  Musculoskeletal: Normal range of motion. He exhibits no tenderness.  Lymphadenopathy:    He has no cervical adenopathy.  Neurological: He is alert and oriented to person, place, and time.  Follow commands and answers questions appropriately. CAOx4.  Skin: Skin is warm and dry. Capillary refill takes less than 2 seconds. No rash noted.  Psychiatric: His speech is normal. Judgment normal. He is withdrawn. He exhibits a depressed mood. He expresses suicidal ideation. He expresses no homicidal ideation. He expresses suicidal plans. He expresses no homicidal plans.  Nursing note and vitals reviewed.    ED Treatments / Results  Labs (all labs ordered are listed, but only abnormal results are displayed) Labs Reviewed  COMPREHENSIVE METABOLIC PANEL -  Abnormal; Notable for the following:       Result Value   Potassium 3.4 (*)    CO2 18 (*)    Calcium 8.6 (*)    AST 54 (*)    Alkaline Phosphatase 132 (*)    Total Bilirubin 1.9 (*)    All other components within normal limits  ETHANOL - Abnormal; Notable for the following:    Alcohol, Ethyl (B) 219 (*)    All other components within normal limits  ACETAMINOPHEN LEVEL - Abnormal; Notable for the following:    Acetaminophen (Tylenol), Serum <10 (*)    All other components within normal limits  CBC - Abnormal; Notable for the following:    RDW 17.4 (*)    Platelets 98 (*)    All other components within normal limits  SALICYLATE LEVEL  RAPID URINE DRUG SCREEN, HOSP PERFORMED    EKG  EKG Interpretation None       Radiology No results found.  Procedures Procedures (including critical care time)  Medications Ordered in ED Medications - No data to display   Initial Impression / Assessment and Plan  / ED Course  I have reviewed the triage vital signs and the nursing notes.  Pertinent labs & imaging results that were available during my care of the patient were reviewed by me and considered in my medical decision making (see chart for details).     Patient presents to the ED with complaints of suicidal ideations. Patient has been clinically sober for 20 months however started drinking 4 days ago. Patient reports severe depression. Last drink was at 7 AM this morning. Patient has no medical complaints at this time. Patient is on oxygen at night for his COPD. Patient was seen at [facility] however they will not take him due to his oxygen requirement at night. This is baseline for patient. He is satting at 93% on RA. Patient denies any new complaints of shortness of breath, cough, fevers, chest pain.  Patient denies any medical complaints at this time. Medical screening lab work has been performed. This is reassuring. Thrombocytopenia noted which is baseline for patient. Mild elevation in liver enzmes with history of cirrhosis. The patient ethanol is 219. However patient is responding appropriately answer questions appropriately. He is conscious alert and oriented 4. Lungs are clear to auscultation bilaterally. Vital signs are reassuring. The patient can be medically cleared for TTS consult and disposition.   Pysch hold orders were placed. And home meds ordered.   Dicussed with Dr.Isaacs who is agreeable to the above plan.   Final Clinical Impressions(s) / ED Diagnoses   Final diagnoses:  None    New Prescriptions New Prescriptions   No medications on file     Aaron Edelman 07/13/17 1619    Duffy Bruce, MD 07/15/17 1122

## 2017-07-13 NOTE — ED Notes (Addendum)
Pt has a ring, 25.00, and a pocket knife locked with security.  Key placed in belongings bag.
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
## 2017-07-14 ENCOUNTER — Inpatient Hospital Stay (HOSPITAL_COMMUNITY)
Admission: EM | Admit: 2017-07-14 | Discharge: 2017-08-03 | DRG: 378 | Disposition: A | Discharge status: Home / Self Care | Payer: Medicare Other | Attending: Family Medicine | Admitting: Family Medicine

## 2017-07-14 ENCOUNTER — Emergency Department (HOSPITAL_COMMUNITY): Payer: Medicare Other

## 2017-07-14 DIAGNOSIS — K7030 Alcoholic cirrhosis of liver without ascites: Secondary | ICD-10-CM | POA: Diagnosis not present

## 2017-07-14 DIAGNOSIS — Z8261 Family history of arthritis: Secondary | ICD-10-CM

## 2017-07-14 DIAGNOSIS — R195 Other fecal abnormalities: Secondary | ICD-10-CM | POA: Diagnosis present

## 2017-07-14 DIAGNOSIS — D696 Thrombocytopenia, unspecified: Secondary | ICD-10-CM | POA: Diagnosis not present

## 2017-07-14 DIAGNOSIS — F1094 Alcohol use, unspecified with alcohol-induced mood disorder: Secondary | ICD-10-CM | POA: Diagnosis not present

## 2017-07-14 DIAGNOSIS — I864 Gastric varices: Secondary | ICD-10-CM | POA: Diagnosis present

## 2017-07-14 DIAGNOSIS — Z8673 Personal history of transient ischemic attack (TIA), and cerebral infarction without residual deficits: Secondary | ICD-10-CM

## 2017-07-14 DIAGNOSIS — F10129 Alcohol abuse with intoxication, unspecified: Secondary | ICD-10-CM

## 2017-07-14 DIAGNOSIS — F17210 Nicotine dependence, cigarettes, uncomplicated: Secondary | ICD-10-CM

## 2017-07-14 DIAGNOSIS — Z915 Personal history of self-harm: Secondary | ICD-10-CM

## 2017-07-14 DIAGNOSIS — Z818 Family history of other mental and behavioral disorders: Secondary | ICD-10-CM

## 2017-07-14 DIAGNOSIS — F3131 Bipolar disorder, current episode depressed, mild: Secondary | ICD-10-CM | POA: Diagnosis present

## 2017-07-14 DIAGNOSIS — Z9114 Patient's other noncompliance with medication regimen: Secondary | ICD-10-CM

## 2017-07-14 DIAGNOSIS — D62 Acute posthemorrhagic anemia: Secondary | ICD-10-CM | POA: Diagnosis present

## 2017-07-14 DIAGNOSIS — Z91410 Personal history of adult physical and sexual abuse: Secondary | ICD-10-CM

## 2017-07-14 DIAGNOSIS — K922 Gastrointestinal hemorrhage, unspecified: Principal | ICD-10-CM | POA: Diagnosis present

## 2017-07-14 DIAGNOSIS — R45851 Suicidal ideations: Secondary | ICD-10-CM

## 2017-07-14 DIAGNOSIS — Y907 Blood alcohol level of 200-239 mg/100 ml: Secondary | ICD-10-CM

## 2017-07-14 DIAGNOSIS — Z86010 Personal history of colonic polyps: Secondary | ICD-10-CM

## 2017-07-14 DIAGNOSIS — Z86718 Personal history of other venous thrombosis and embolism: Secondary | ICD-10-CM

## 2017-07-14 DIAGNOSIS — Z0000 Encounter for general adult medical examination without abnormal findings: Secondary | ICD-10-CM

## 2017-07-14 DIAGNOSIS — F1410 Cocaine abuse, uncomplicated: Secondary | ICD-10-CM | POA: Diagnosis present

## 2017-07-14 DIAGNOSIS — F1010 Alcohol abuse, uncomplicated: Secondary | ICD-10-CM | POA: Diagnosis not present

## 2017-07-14 DIAGNOSIS — B1920 Unspecified viral hepatitis C without hepatic coma: Secondary | ICD-10-CM | POA: Diagnosis present

## 2017-07-14 DIAGNOSIS — I8500 Esophageal varices without bleeding: Secondary | ICD-10-CM | POA: Diagnosis present

## 2017-07-14 DIAGNOSIS — Q211 Atrial septal defect: Secondary | ICD-10-CM

## 2017-07-14 DIAGNOSIS — R0789 Other chest pain: Secondary | ICD-10-CM | POA: Diagnosis not present

## 2017-07-14 DIAGNOSIS — E876 Hypokalemia: Secondary | ICD-10-CM | POA: Diagnosis present

## 2017-07-14 DIAGNOSIS — D61818 Other pancytopenia: Secondary | ICD-10-CM | POA: Diagnosis present

## 2017-07-14 DIAGNOSIS — Z833 Family history of diabetes mellitus: Secondary | ICD-10-CM

## 2017-07-14 DIAGNOSIS — F1014 Alcohol abuse with alcohol-induced mood disorder: Secondary | ICD-10-CM | POA: Diagnosis present

## 2017-07-14 DIAGNOSIS — J449 Chronic obstructive pulmonary disease, unspecified: Secondary | ICD-10-CM | POA: Diagnosis present

## 2017-07-14 DIAGNOSIS — Z801 Family history of malignant neoplasm of trachea, bronchus and lung: Secondary | ICD-10-CM

## 2017-07-14 DIAGNOSIS — K7460 Unspecified cirrhosis of liver: Secondary | ICD-10-CM | POA: Diagnosis present

## 2017-07-14 LAB — BASIC METABOLIC PANEL
Anion gap: 10 (ref 5–15)
BUN: 6 mg/dL (ref 6–20)
CALCIUM: 8.4 mg/dL — AB (ref 8.9–10.3)
CO2: 21 mmol/L — ABNORMAL LOW (ref 22–32)
CREATININE: 0.86 mg/dL (ref 0.61–1.24)
Chloride: 106 mmol/L (ref 101–111)
GLUCOSE: 92 mg/dL (ref 65–99)
Potassium: 3.2 mmol/L — ABNORMAL LOW (ref 3.5–5.1)
Sodium: 137 mmol/L (ref 135–145)

## 2017-07-14 LAB — CBC
HEMATOCRIT: 36.3 % — AB (ref 39.0–52.0)
Hemoglobin: 12.2 g/dL — ABNORMAL LOW (ref 13.0–17.0)
MCH: 27.2 pg (ref 26.0–34.0)
MCHC: 33.6 g/dL (ref 30.0–36.0)
MCV: 81 fL (ref 78.0–100.0)
PLATELETS: 55 10*3/uL — AB (ref 150–400)
RBC: 4.48 MIL/uL (ref 4.22–5.81)
RDW: 17 % — AB (ref 11.5–15.5)
WBC: 4.4 10*3/uL (ref 4.0–10.5)

## 2017-07-14 LAB — POC OCCULT BLOOD, ED: Fecal Occult Bld: POSITIVE — AB

## 2017-07-14 LAB — I-STAT TROPONIN, ED: TROPONIN I, POC: 0 ng/mL (ref 0.00–0.08)

## 2017-07-14 MED ORDER — ACETAMINOPHEN 325 MG PO TABS
650.0000 mg | ORAL_TABLET | Freq: Four times a day (QID) | ORAL | Status: DC | PRN
  Administered 2017-07-15 – 2017-07-23 (×3): 650 mg via ORAL
  Filled 2017-07-14 (×5): qty 2

## 2017-07-14 MED ORDER — VITAMIN B-1 100 MG PO TABS
100.0000 mg | ORAL_TABLET | Freq: Every day | ORAL | Status: DC
  Administered 2017-07-15 – 2017-08-03 (×20): 100 mg via ORAL
  Filled 2017-07-14 (×20): qty 1

## 2017-07-14 MED ORDER — LORAZEPAM 2 MG/ML IJ SOLN: 1.0000 mg | Freq: Four times a day (QID) | INTRAMUSCULAR | Status: AC | PRN

## 2017-07-14 MED ORDER — POTASSIUM CHLORIDE CRYS ER 20 MEQ PO TBCR
40.0000 meq | EXTENDED_RELEASE_TABLET | Freq: Once | ORAL | Status: AC
Start: 2017-07-14 — End: 2017-07-14
  Administered 2017-07-14: 40 meq via ORAL
  Filled 2017-07-14: qty 2

## 2017-07-14 MED ORDER — SODIUM CHLORIDE 0.9 % IV SOLN
8.0000 mg/h | INTRAVENOUS | Status: DC
  Administered 2017-07-14: 8 mg/h via INTRAVENOUS
  Filled 2017-07-14 (×4): qty 80

## 2017-07-14 MED ORDER — LAMOTRIGINE 25 MG PO TABS
150.0000 mg | ORAL_TABLET | Freq: Every day | ORAL | Status: DC
  Administered 2017-07-15 – 2017-08-03 (×20): 150 mg via ORAL
  Filled 2017-07-14: qty 2
  Filled 2017-07-14: qty 1
  Filled 2017-07-14 (×19): qty 2

## 2017-07-14 MED ORDER — PANTOPRAZOLE SODIUM 40 MG IV SOLR: 40.0000 mg | Freq: Two times a day (BID) | INTRAVENOUS | Status: DC

## 2017-07-14 MED ORDER — GI COCKTAIL ~~LOC~~
30.0000 mL | Freq: Once | ORAL | Status: AC
Start: 2017-07-14 — End: 2017-07-14
  Administered 2017-07-14: 30 mL via ORAL
  Filled 2017-07-14: qty 30

## 2017-07-14 MED ORDER — SODIUM CHLORIDE 0.9 % IV SOLN
80.0000 mg | Freq: Once | INTRAVENOUS | Status: DC
  Filled 2017-07-14: qty 80

## 2017-07-14 MED ORDER — NICOTINE 21 MG/24HR TD PT24
21.0000 mg | MEDICATED_PATCH | Freq: Every day | TRANSDERMAL | Status: DC
  Filled 2017-07-14 (×17): qty 1

## 2017-07-14 MED ORDER — HYDROXYZINE HCL 25 MG PO TABS
25.0000 mg | ORAL_TABLET | Freq: Two times a day (BID) | ORAL | Status: DC
  Administered 2017-07-14 – 2017-08-03 (×40): 25 mg via ORAL
  Filled 2017-07-14 (×40): qty 1

## 2017-07-14 MED ORDER — FOLIC ACID 1 MG PO TABS
1.0000 mg | ORAL_TABLET | Freq: Every day | ORAL | Status: DC
  Administered 2017-07-15 – 2017-08-03 (×20): 1 mg via ORAL
  Filled 2017-07-14 (×20): qty 1

## 2017-07-14 MED ORDER — ADULT MULTIVITAMIN W/MINERALS CH
1.0000 | ORAL_TABLET | Freq: Every day | ORAL | Status: DC
  Administered 2017-07-15 – 2017-08-03 (×20): 1 via ORAL
  Filled 2017-07-14 (×20): qty 1

## 2017-07-14 MED ORDER — ASPIRIN 81 MG PO CHEW
324.0000 mg | CHEWABLE_TABLET | Freq: Once | ORAL | Status: AC
  Administered 2017-07-14: 324 mg via ORAL
  Filled 2017-07-14: qty 4

## 2017-07-14 MED ORDER — ALUM & MAG HYDROXIDE-SIMETH 200-200-20 MG/5ML PO SUSP
30.0000 mL | Freq: Four times a day (QID) | ORAL | Status: DC | PRN
  Administered 2017-07-16: 30 mL via ORAL
  Filled 2017-07-14: qty 30

## 2017-07-14 MED ORDER — ONDANSETRON HCL 4 MG/2ML IJ SOLN: 4.0000 mg | Freq: Four times a day (QID) | INTRAMUSCULAR | Status: DC | PRN

## 2017-07-14 MED ORDER — LORAZEPAM 1 MG PO TABS: 1.0000 mg | ORAL_TABLET | Freq: Four times a day (QID) | ORAL | Status: AC | PRN

## 2017-07-14 MED ORDER — ONDANSETRON HCL 4 MG PO TABS: 4.0000 mg | ORAL_TABLET | Freq: Four times a day (QID) | ORAL | Status: DC | PRN

## 2017-07-14 MED ORDER — ACETAMINOPHEN 650 MG RE SUPP: 650.0000 mg | Freq: Four times a day (QID) | RECTAL | Status: DC | PRN

## 2017-07-14 MED ORDER — THIAMINE HCL 100 MG/ML IJ SOLN: 100.0000 mg | Freq: Every day | INTRAMUSCULAR | Status: DC

## 2017-07-14 MED ORDER — TRAZODONE HCL 100 MG PO TABS
100.0000 mg | ORAL_TABLET | Freq: Every day | ORAL | Status: DC
  Administered 2017-07-14 – 2017-08-02 (×20): 100 mg via ORAL
  Filled 2017-07-14 (×5): qty 1
  Filled 2017-07-14: qty 2
  Filled 2017-07-14 (×14): qty 1

## 2017-07-14 NOTE — ED Provider Notes (Signed)
[facility] DEPT Provider Note   CSN: [id number] Arrival date & time: 07/14/17  1819     History   Chief Complaint No chief complaint on file.   HPI Patrick [name] is a 66 y.o. male.  HPI   66 year old male presents today with complaints of chest pain and suicidal ideation.  Patient reports that over the last 2 days he has had pain in his mid chest.  He describes this as tightness with no radiation of symptoms.  Patient reports symptoms are worse with ambulation.  He notes shortness of breath, this is chronic in his baseline.  He normally uses 3 L of O2 at night and as needed during the day.  Patient denies any sharp chest pain, prolonged immobilization, lower extremity swelling or edema.  Patient does report history DVT in the past.  Patient denies any cough, fever, or any infectious etiology.  Patient reports he continues to smoke.  Patient reports family history of ACS in his father and his brother.  Patient notes that he was sober for over 2 years and started drinking again recently.  Patient notes that he has had suicidal ideations as he is not happy with his drinking.  He was seen at [facility] long emergency room and discharged home today.   Pt reports he recently moved from [place].  Past Medical History:  Diagnosis Date  . Anxiety   . Bipolar 1 disorder ([place])   . Cirrhosis ([place])   . COPD (chronic obstructive pulmonary disease) ([place])   . Depression   . Heart murmur   . Hepatitis C   . Oxygen deficiency   . Polysubstance abuse   . Stroke [facility])     Patient Active Problem List   Diagnosis Date Noted  . Alcohol-induced mood disorder ([place]) 07/14/2017  . COPD (chronic obstructive pulmonary disease) ([place]) 07/08/2017  . Hepatic encephalopathy ([place]) 01/05/2015  . Bipolar 1 disorder ([place]) 01/05/2015  . Insomnia 01/05/2015  . Anemia, chronic disease 01/05/2015  . Neuropathy 01/05/2015  . Cirrhosis ([place]) 01/05/2015  . History of stroke 01/05/2015  . Altered mental  status     Past Surgical History:  Procedure Laterality Date  . arm surgery         Home Medications    Prior to Admission medications   Medication Sig Start Date End Date Taking? Authorizing Provider  hydrOXYzine (ATARAX/VISTARIL) 25 MG tablet Take 25 mg by mouth 2 (two) times daily.    [provider]  ibuprofen (ADVIL,MOTRIN) 200 MG tablet Take 200-800 mg by mouth every 6 (six) hours as needed for headache or moderate pain.    [provider]  lactulose (CHRONULAC) 10 GM/15ML solution Take 45 mLs (30 g total) by mouth 2 (two) times daily. Patient not taking: Reported on 07/13/2017 07/08/17   Dettinger, Fransisca Kaufmann, MD  lamoTRIgine (LAMICTAL) 150 MG tablet Take 150 mg by mouth daily.    [provider]  propranolol (INDERAL) 20 MG tablet Take 1 tablet (20 mg total) by mouth 2 (two) times daily. Patient not taking: Reported on 07/13/2017 07/08/17   Dettinger, Fransisca Kaufmann, MD  traZODone (DESYREL) 100 MG tablet Take 100 mg by mouth at bedtime.    [provider]    Family History Family History  Problem Relation Age of Onset  . Arthritis Mother   . Cancer Father        lung  . Diabetes Father   . Mental illness Brother        depression  .  Heart disease Paternal Grandmother   . Cancer Paternal Grandmother        skin  . Diabetes Paternal Uncle     Social History Social History  Substance Use Topics  . Smoking status: Current Every Day Smoker    Packs/day: 0.25    Years: 50.00    Types: Cigarettes  . Smokeless tobacco: Never Used  . Alcohol use Yes     Comment: 26 months alcohol free- relapse on 07/10/17     Allergies   Other and Tramadol   Review of Systems Review of Systems  All other systems reviewed and are negative.    Physical Exam Updated Vital Signs BP 131/77   Pulse 74   Resp 12   SpO2 95%   Physical Exam  Constitutional: He is oriented to person, place, and time. He appears well-developed and well-nourished.  HENT:    Head: Normocephalic and atraumatic.  Eyes: Pupils are equal, round, and reactive to light. Conjunctivae are normal. Right eye exhibits no discharge. Left eye exhibits no discharge. No scleral icterus.  Neck: Normal range of motion. No JVD present. No tracheal deviation present.  Cardiovascular: Normal rate, regular rhythm and intact distal pulses.  Exam reveals no gallop and no friction rub.   Murmur heard. Systolic murmur   Pulmonary/Chest: Effort normal and breath sounds normal. No stridor. No respiratory distress. He has no wheezes. He has no rales. He exhibits no tenderness.  Musculoskeletal: Normal range of motion. He exhibits no edema.  Neurological: He is alert and oriented to person, place, and time. Coordination normal.  Psychiatric: He has a normal mood and affect. His behavior is normal. Judgment and thought content normal.  Nursing note and vitals reviewed.    ED Treatments / Results  Labs (all labs ordered are listed, but only abnormal results are displayed) Labs Reviewed  BASIC METABOLIC PANEL - Abnormal; Notable for the following:       Result Value   Potassium 3.2 (*)    CO2 21 (*)    Calcium 8.4 (*)    All other components within normal limits  CBC - Abnormal; Notable for the following:    Hemoglobin 12.2 (*)    HCT 36.3 (*)    RDW 17.0 (*)    Platelets 55 (*)    All other components within normal limits  I-STAT TROPONIN, ED    EKG  EKG Interpretation  Date/Time:  Wednesday July 14 2017 18:27:38 EDT Ventricular Rate:  74 PR Interval:    QRS Duration: 94 QT Interval:  393 QTC Calculation: 436 R Axis:   63 Text Interpretation:  Sinus rhythm Consider left atrial enlargement Consider left ventricular hypertrophy When compared to prior, no significant changes seen.  No STEMI Confirmed by Antony Blackbird [phone]) on 07/14/2017 7:39:19 PM       Radiology Dg Chest 2 View  Result Date: 07/14/2017 CLINICAL DATA:  Chest pain. EXAM: CHEST  2 VIEW COMPARISON:   Radiographs of Mar 22, 2017. FINDINGS: The heart size and mediastinal contours are within normal limits. Both lungs are clear. No pneumothorax or pleural effusion is noted. The visualized skeletal structures are unremarkable. IMPRESSION: No active cardiopulmonary disease. Electronically Signed   By: Marijo Conception, M.D.   On: 07/14/2017 19:45    Procedures Procedures (including critical care time)  Medications Ordered in ED Medications  aspirin chewable tablet 324 mg (324 mg Oral Given 07/14/17 2140)     Initial Impression / Assessment and Plan / ED Course  I have reviewed the triage vital signs and the nursing notes.  Pertinent labs & imaging results that were available during my care of the patient were reviewed by me and considered in my medical decision making (see chart for details).      Final Clinical Impressions(s) / ED Diagnoses   Final diagnoses:  Atypical chest pain    Labs: I-STAT troponin, BMP CBC  Imaging: DG Chest 2 View  Consults: [facility], Cardiology   Therapeutics: Aspirin  Discharge Meds:   Assessment/Plan: 67 year old male presents today with complaints of chest pain.  Patient has a moderate heart score with no previous cardiac testing.  Due to patient's continued complaints I feel hospital observation for ACS rule out would be prudent.  I have low suspicion for PE in this patient.  Patient has already been medically cleared from psychiatry.  Case was discussed with hospitalist who would like Cardiology consult.  I spoke with on-call fellow who agreed that hospitalist observation for ACS rule out would be most prudent given we do not have significant back on history on this patient.   [facility] will see the patient here in the ED and discuss disposition.   New Prescriptions New Prescriptions   No medications on file     Francee Gentile 07/14/17 2147    Tegeler, Gwenyth Allegra, MD 07/14/17 [phone]

## 2017-07-14 NOTE — ED Triage Notes (Signed)
Pt arrived via [facility] after experiencing central chest pain without radiation. Pt is short of breath, 90% Sp02 on RA with EMS. EMS placed pt on 2lpm [place] bring 02 levels to 95%. Pt states pain is currently 8/10. EMS gave 324 ASA en route buyt held nitro due to BP of 102/70. Pt admits to ETOH use today, stating he drank 5x 12oz beers. Pt states he is having suicidal thoughts and was seen as [facility] yesterday. Pt has hx of copd, bipolar, liver disease. Pt is alert and oriented x4.

## 2017-07-14 NOTE — BHH Counselor (Signed)
07/14/2017: Per Wynonia Hazard, RN, [facility]: Patient can not be accepted to [facility], due to requiring use of Oxygen equipment.  Per Lindon Romp, NP: AM re-assessment recommended.

## 2017-07-14 NOTE — ED Notes (Signed)
Patient stating he is still SI and wanting to stay and be placed. ED MD aware and [facility] NP aware and still states he needs to be discharged.

## 2017-07-14 NOTE — BHH Suicide Risk Assessment (Signed)
Suicide Risk Assessment  Discharge Assessment   [facility] Discharge Suicide Risk Assessment   Principal Problem: Alcohol-induced mood disorder [facility]) Discharge Diagnoses:  Patient Active Problem List   Diagnosis Date Noted  . Alcohol-induced mood disorder ([place]) [F10.94] 07/14/2017  . COPD (chronic obstructive pulmonary disease) ([place]) [J44.9] 07/08/2017  . Hepatic encephalopathy ([place]) [K72.90] 01/05/2015  . Bipolar 1 disorder ([place]) [F31.9] 01/05/2015  . Insomnia [G47.00] 01/05/2015  . Anemia, chronic disease [D63.8] 01/05/2015  . Neuropathy [G62.9] 01/05/2015  . Cirrhosis ([place]) [K74.60] 01/05/2015  . History of stroke [Z86.73] 01/05/2015  . Altered mental status [R41.82]     Total Time spent with patient: 45 minutes  Musculoskeletal: Strength & Muscle Tone: within normal limits Gait & Station: normal Patient leans: N/A  Psychiatric Specialty Exam: Physical Exam  Constitutional: He appears well-developed.  HENT:  Head: Normocephalic.  Respiratory:  Wears continuous O2 due to COPD  Musculoskeletal: Normal range of motion.  Psychiatric: His speech is normal and behavior is normal. Thought content normal. Cognition and memory are normal. He expresses impulsivity. He exhibits a depressed mood.   Review of Systems  Psychiatric/Behavioral: Positive for depression and substance abuse. Negative for hallucinations, memory loss and suicidal ideas. The patient is nervous/anxious. The patient does not have insomnia.   All other systems reviewed and are negative.  Blood pressure 132/61, pulse 74, temperature 99.2 F (37.3 C), temperature source Oral, resp. rate 18, height 5\' 8"  (1.727 m), weight 86.2 kg (190 lb), SpO2 92 %.Body mass index is 28.89 kg/m. General Appearance: Casual Eye Contact:  Fair Speech:  Clear and Coherent Volume:  Normal Mood:  Anxious and Depressed Affect:  Congruent and Depressed Thought Process:  Coherent, Goal Directed and Linear Orientation:  Full (Time, Place,  and Person) Thought Content:  Logical Suicidal Thoughts:  No Homicidal Thoughts:  No Memory:  Immediate;   Good Recent;   Good Remote;   Fair Judgement:  Fair Insight:  Fair Psychomotor Activity:  Normal Concentration:  Concentration: Good and Attention Span: Good Recall:  Good Fund of Knowledge:  Good Language:  Good Akathisia:  No Handed:  Right AIMS (if indicated):    Assets:  Agricultural consultant Housing Resilience ADL's:  Intact Cognition:  WNL  Mental Status Per Nursing Assessment::   On Admission:   intoxicated and suicidal ideation  Demographic Factors:  Male, Caucasian, Low socioeconomic status and Living alone  Loss Factors: Financial problems/change in socioeconomic status  Historical Factors: Impulsivity  Risk Reduction Factors:   Sense of responsibility to family  Continued Clinical Symptoms:  Severe Anxiety and/or Agitation Depression:   Impulsivity Alcohol/Substance Abuse/Dependencies  Cognitive Features That Contribute To Risk:  Closed-mindedness    Suicide Risk:  Minimal: No identifiable suicidal ideation.  Patients presenting with no risk factors but with morbid ruminations; may be classified as minimal risk based on the severity of the depressive symptoms    Plan Of Care/Follow-up recommendations:  Activity:  as tolerated Diet:  Heart Healthy  Ethelene Hal, NP 07/14/2017, 12:44 PM

## 2017-07-14 NOTE — H&P (Signed)
History and Physical    DAGMAWI VENABLE IDP:[id number] DOB: [birth year] DOA: 07/14/2017  PCP: Dettinger, Fransisca Kaufmann, MD  Patient coming from: Home  I have personally briefly reviewed patient's old medical records in [place]  Chief Complaint: Suicidal, chest pain  HPI: Patrick [name] is a 66 y.o. male with medical history significant of EtOH abuse, cocaine abuse, cirrhosis, HCV, apparently esophageal and gastric varices.  Patient presents to the ED for 2nd time in 2 days with c/o feeling suicidal and having chest pain.  He was cleared from a psych perspective from [facility] ED yesterday.  Chest pain onset 3 days ago, after using cocaine last.  Is a tightness quality, center chest, no radiation, nothing makes better or worse, constant since onset.  Echo in Aug last year at [facility] shows NL EF, does have a PFO, no valve disease.  Upper EGD and colonoscopy last done at [facility] Aug 2017, results not available.   ED Course: Trop neg, EKG unchanged from priors.  Cardiology didn't recommend provocative inpatient testing if trops remained negative.  HGB drop to 12.2 from 14.4 yesterday noted.  Hemoccult positive.   Review of Systems: As per HPI otherwise 10 point review of systems negative.   Past Medical History:  Diagnosis Date  . Anxiety   . Bipolar 1 disorder ([place])   . Cirrhosis ([place])   . COPD (chronic obstructive pulmonary disease) ([place])   . Depression   . Heart murmur   . Hepatitis C   . Oxygen deficiency   . Polysubstance abuse   . Stroke [facility])     Past Surgical History:  Procedure Laterality Date  . arm surgery       reports that he has been smoking Cigarettes.  He has a 12.50 pack-year smoking history. He has never used smokeless tobacco. He reports that he drinks alcohol. He reports that he does not use drugs.  Allergies  Allergen Reactions  . Other     Shellfish  . Tramadol Itching    Family History  Problem Relation Age of Onset  . Arthritis Mother   . Cancer  Father        lung  . Diabetes Father   . Mental illness Brother        depression  . Heart disease Paternal Grandmother   . Cancer Paternal Grandmother        skin  . Diabetes Paternal Uncle      Prior to Admission medications   Medication Sig Start Date End Date Taking? Authorizing Provider  hydrOXYzine (ATARAX/VISTARIL) 25 MG tablet Take 25 mg by mouth 2 (two) times daily.    [provider]  ibuprofen (ADVIL,MOTRIN) 200 MG tablet Take 200-800 mg by mouth every 6 (six) hours as needed for headache or moderate pain.    [provider]  lactulose (CHRONULAC) 10 GM/15ML solution Take 45 mLs (30 g total) by mouth 2 (two) times daily. Patient not taking: Reported on 07/13/2017 07/08/17   Dettinger, Fransisca Kaufmann, MD  lamoTRIgine (LAMICTAL) 150 MG tablet Take 150 mg by mouth daily.    [provider]  propranolol (INDERAL) 20 MG tablet Take 1 tablet (20 mg total) by mouth 2 (two) times daily. Patient not taking: Reported on 07/13/2017 07/08/17   Dettinger, Fransisca Kaufmann, MD  traZODone (DESYREL) 100 MG tablet Take 100 mg by mouth at bedtime.    [provider]    Physical Exam: Vitals:   07/14/17 1930 07/14/17 2100 07/14/17 2115 07/14/17  2130  BP: (!) 116/54 124/71  131/77  Pulse: 73 80 82 74  Resp: 14 14 15 12   SpO2: 94% 97% 96% 95%    Constitutional: NAD, calm, comfortable Eyes: PERRL, lids and conjunctivae normal ENMT: Mucous membranes are moist. Posterior pharynx clear of any exudate or lesions.Normal dentition.  Neck: normal, supple, no masses, no thyromegaly Respiratory: clear to auscultation bilaterally, no wheezing, no crackles. Normal respiratory effort. No accessory muscle use.  Cardiovascular: Regular rate and rhythm, no murmurs / rubs / gallops. No extremity edema. 2+ pedal pulses. No carotid bruits.  Abdomen: no tenderness, no masses palpated. No hepatosplenomegaly. Bowel sounds positive.  Musculoskeletal: no clubbing / cyanosis. No joint deformity  upper and lower extremities. Good ROM, no contractures. Normal muscle tone.  Skin: no rashes, lesions, ulcers. No induration Neurologic: CN 2-12 grossly intact. Sensation intact, DTR normal. Strength 5/5 in all 4.  Psychiatric: Normal judgment and insight. Alert and oriented x 3. Normal mood.    Labs on Admission: I have personally reviewed following labs and imaging studies  CBC:  Recent Labs Lab 07/08/17 1034 07/13/17 1340 07/14/17 1830  WBC 3.8 7.8 4.4  NEUTROABS 2.0  --   --   HGB 13.5 14.4 12.2*  HCT 41.1 40.8 36.3*  MCV 84 78.9 81.0  PLT 71* 98* 55*   Basic Metabolic Panel:  Recent Labs Lab 07/08/17 1034 07/13/17 1340 07/14/17 1830  NA 139 136 137  K 4.2 3.4* 3.2*  CL 108* 107 106  CO2 19* 18* 21*  GLUCOSE 69 85 92  BUN 11 9 6   CREATININE 0.93 0.80 0.86  CALCIUM 8.6 8.6* 8.4*   GFR: Estimated Creatinine Clearance: 92.7 mL/min (by C-G formula based on SCr of 0.86 mg/dL). Liver Function Tests:  Recent Labs Lab 07/08/17 1034 07/13/17 1340  AST 32 54*  ALT 12 26  ALKPHOS 146* 132*  BILITOT 1.0 1.9*  PROT 7.2 7.6  ALBUMIN 3.7 3.5   No results for input(s): LIPASE, AMYLASE in the last 168 hours.  Recent Labs Lab 07/08/17 1034  AMMONIA 129*   Coagulation Profile: No results for input(s): INR, PROTIME in the last 168 hours. Cardiac Enzymes: No results for input(s): CKTOTAL, CKMB, CKMBINDEX, TROPONINI in the last 168 hours. BNP (last 3 results) No results for input(s): PROBNP in the last 8760 hours. HbA1C: No results for input(s): HGBA1C in the last 72 hours. CBG: No results for input(s): GLUCAP in the last 168 hours. Lipid Profile: No results for input(s): CHOL, HDL, LDLCALC, TRIG, CHOLHDL, LDLDIRECT in the last 72 hours. Thyroid Function Tests: No results for input(s): TSH, T4TOTAL, FREET4, T3FREE, THYROIDAB in the last 72 hours. Anemia Panel: No results for input(s): VITAMINB12, FOLATE, FERRITIN, TIBC, IRON, RETICCTPCT in the last 72  hours. Urine analysis:    Component Value Date/Time   COLORURINE YELLOW 03/11/2011 0141   APPEARANCEUR CLEAR 03/11/2011 0141   LABSPEC <1.005 (L) 03/11/2011 0141   PHURINE 6.0 03/11/2011 0141   GLUCOSEU NEGATIVE 03/11/2011 0141   HGBUR MODERATE (A) 03/11/2011 0141   BILIRUBINUR NEGATIVE 03/11/2011 0141   KETONESUR NEGATIVE 03/11/2011 0141   PROTEINUR NEGATIVE 03/11/2011 0141   UROBILINOGEN 0.2 03/11/2011 0141   NITRITE NEGATIVE 03/11/2011 0141   LEUKOCYTESUR NEGATIVE 03/11/2011 0141    Radiological Exams on Admission: Dg Chest 2 View  Result Date: 07/14/2017 CLINICAL DATA:  Chest pain. EXAM: CHEST  2 VIEW COMPARISON:  Radiographs of Mar 22, 2017. FINDINGS: The heart size and mediastinal contours are within normal limits. Both  lungs are clear. No pneumothorax or pleural effusion is noted. The visualized skeletal structures are unremarkable. IMPRESSION: No active cardiopulmonary disease. Electronically Signed   By: Marijo Conception, M.D.   On: 07/14/2017 19:45    EKG: Independently reviewed.  Assessment/Plan Principal Problem:   Occult blood positive stool Active Problems:   Cirrhosis (HCC)   ETOH abuse   Thrombocytopenia (HCC)   Chest pain    1. Occult blood positive stool - 1. 2gm HGB drop since yesterday 2. No report of stigmata of GIB though but concern given patient seems likely high risk for major GI bleed 1. Apparent h/o varicies based on diagnosis from EGD procedure (that we cant see report of) from [place] last year 2. H/o cirrhosis 3. Chronic thrombocytopenia 3. PPI GTT 4. NPO after midnight 5. Call GI in AM 2. H/o EtOH abuse - CIWA 3. Chest pain - probably associated with cocaine use 1. Serial trops 2. Likely okay to follow up outpatient with cards if persists  DVT prophylaxis: SCDs Code Status: Full Family Communication: No family in room Disposition Plan: Home after admit Consults called: EDP spoke with cardiology fellow who felt they didn't need to see  patient Admission status: Place in [place], [place] Pager [phone]  If 7AM-7PM, please contact day team taking care of patient [email or web address] Password [email or web address]  07/14/2017, 10:36 PM

## 2017-07-14 NOTE — ED Notes (Signed)
Pt ambulated to restroom. 

## 2017-07-14 NOTE — Consult Note (Signed)
[facility] Consult   Reason for Consult:  Intoxication and suicidal ideation Referring Physician:  EDP Patient Identification: Patrick [name] MRN:  [id number] Principal Diagnosis: Alcohol-induced mood disorder ([place]) Diagnosis:   Patient Active Problem List   Diagnosis Date Noted  . Alcohol-induced mood disorder ([place]) [F10.94] 07/14/2017  . COPD (chronic obstructive pulmonary disease) ([place]) [J44.9] 07/08/2017  . Hepatic encephalopathy ([place]) [K72.90] 01/05/2015  . Bipolar 1 disorder ([place]) [F31.9] 01/05/2015  . Insomnia [G47.00] 01/05/2015  . Anemia, chronic disease [D63.8] 01/05/2015  . Neuropathy [G62.9] 01/05/2015  . Cirrhosis ([place]) [K74.60] 01/05/2015  . History of stroke [Z86.73] 01/05/2015  . Altered mental status [R41.82]     Total Time spent with patient: 45 minutes  Subjective:   Patrick [name] is a 66 y.o. male patient admitted with alcohol induced suicidal ideation.  HPI:  Pt was seen and chart reviewed with Dr Darleene Cleaver. Pt stated he arrived here from [place] a few days ago and was at an [facility] but they told him to leave because he was drinking. Pt stated he had 26 months of sobriety previous to drinking again. Pt's UDS positive for cocaine and BAL 219.  Pt was seen at [facility] in [place] upon his arrival for medication management. Pt stated he was going to live with family here but because of his drinking he can not stay there. Pt has been residing at [facility] in [place].  Pt denies suicidal/homicidal ideation, denies auditory/visual hallucinations and does not appear to be responding to internal stimuli. Pt is psychiatrically clear for discharge.   Past Psychiatric History: As above  Risk to Self: None Risk to Others: None Prior Inpatient Therapy: Prior Inpatient Therapy: Yes Prior Therapy Dates:  ("I can't remember") Prior Therapy Facilty/Provider(s):  ([facility], [place] ) Reason for Treatment:  (depression and  substance abuse ) Prior Outpatient Therapy: Prior Outpatient Therapy: Yes Prior Therapy Dates:  (current ) Prior Therapy Facilty/Provider(s):  ([facility] 2 days ago ) Reason for Treatment:  (medication managment(Lamictral, hydrozozine, Trazadone, Effe) Does patient have an ACCT team?: No Does patient have Intensive In-House Services?  : No Does patient have [facility] services? : No Does patient have [facility] services?: No  Past Medical History:  Past Medical History:  Diagnosis Date  . Anxiety   . Bipolar 1 disorder ([place])   . Cirrhosis ([place])   . COPD (chronic obstructive pulmonary disease) ([place])   . Depression   . Heart murmur   . Hepatitis C   . Oxygen deficiency   . Polysubstance abuse   . Stroke [facility])     Past Surgical History:  Procedure Laterality Date  . arm surgery     Family History:  Family History  Problem Relation Age of Onset  . Arthritis Mother   . Cancer Father        lung  . Diabetes Father   . Mental illness Brother        depression  . Heart disease Paternal Grandmother   . Cancer Paternal Grandmother        skin  . Diabetes Paternal Uncle    Family Psychiatric  History: Unknown Social History:  History  Alcohol Use  . Yes    Comment: 26 months alcohol free- relapse on 07/10/17     History  Drug Use No    Social History   Social History  . Marital status: Divorced    Spouse name: N/A  . Number of children: N/A  . Years  of education: N/A   Social History Main Topics  . Smoking status: Current Every Day Smoker    Packs/day: 0.25    Years: 50.00    Types: Cigarettes  . Smokeless tobacco: Never Used  . Alcohol use Yes     Comment: 26 months alcohol free- relapse on 07/10/17  . Drug use: No  . Sexual activity: No   Other Topics Concern  . None   Social History Narrative  . None   Additional Social History:    Allergies:   Allergies  Allergen Reactions  . Other     Shellfish  . Tramadol Itching    Labs:  Results for orders placed  or performed during the hospital encounter of 07/13/17 (from the past 48 hour(s))  Comprehensive metabolic panel     Status: Abnormal   Collection Time: 07/13/17  1:40 PM  Result Value Ref Range   Sodium 136 135 - 145 mmol/L   Potassium 3.4 (L) 3.5 - 5.1 mmol/L   Chloride 107 101 - 111 mmol/L   CO2 18 (L) 22 - 32 mmol/L   Glucose, Bld 85 65 - 99 mg/dL   BUN 9 6 - 20 mg/dL   Creatinine, Ser 0.80 0.61 - 1.24 mg/dL   Calcium 8.6 (L) 8.9 - 10.3 mg/dL   Total Protein 7.6 6.5 - 8.1 g/dL   Albumin 3.5 3.5 - 5.0 g/dL   AST 54 (H) 15 - 41 U/L   ALT 26 17 - 63 U/L   Alkaline Phosphatase 132 (H) 38 - 126 U/L   Total Bilirubin 1.9 (H) 0.3 - 1.2 mg/dL   GFR calc non Af Amer >60 >60 mL/min   GFR calc Af Amer >60 >60 mL/min    Comment: (NOTE) The eGFR has been calculated using the CKD EPI equation. This calculation has not been validated in all clinical situations. eGFR's persistently <60 mL/min signify possible Chronic Kidney Disease.    Anion gap 11 5 - 15  Ethanol     Status: Abnormal   Collection Time: 07/13/17  1:40 PM  Result Value Ref Range   Alcohol, Ethyl (B) 219 (H) <5 mg/dL    Comment:        LOWEST DETECTABLE LIMIT FOR SERUM ALCOHOL IS 5 mg/dL FOR MEDICAL PURPOSES ONLY   Salicylate level     Status: None   Collection Time: 07/13/17  1:40 PM  Result Value Ref Range   Salicylate Lvl <0.7 2.8 - 30.0 mg/dL  Acetaminophen level     Status: Abnormal   Collection Time: 07/13/17  1:40 PM  Result Value Ref Range   Acetaminophen (Tylenol), Serum <10 (L) 10 - 30 ug/mL    Comment:        THERAPEUTIC CONCENTRATIONS VARY SIGNIFICANTLY. A RANGE OF 10-30 ug/mL MAY BE AN EFFECTIVE CONCENTRATION FOR MANY PATIENTS. HOWEVER, SOME ARE BEST TREATED AT CONCENTRATIONS OUTSIDE THIS RANGE. ACETAMINOPHEN CONCENTRATIONS >150 ug/mL AT 4 HOURS AFTER INGESTION AND >50 ug/mL AT 12 HOURS AFTER INGESTION ARE OFTEN ASSOCIATED WITH TOXIC REACTIONS.   cbc     Status: Abnormal   Collection Time:  07/13/17  1:40 PM  Result Value Ref Range   WBC 7.8 4.0 - 10.5 K/uL   RBC 5.17 4.22 - 5.81 MIL/uL   Hemoglobin 14.4 13.0 - 17.0 g/dL   HCT 40.8 39.0 - 52.0 %   MCV 78.9 78.0 - 100.0 fL   MCH 27.9 26.0 - 34.0 pg   MCHC 35.3 30.0 - 36.0 g/dL  RDW 17.4 (H) 11.5 - 15.5 %   Platelets 98 (L) 150 - 400 K/uL    Comment: SPECIMEN CHECKED FOR CLOTS PLATELET COUNT CONFIRMED BY SMEAR   Rapid urine drug screen (hospital performed)     Status: Abnormal   Collection Time: 07/13/17  4:58 PM  Result Value Ref Range   Opiates NONE DETECTED NONE DETECTED   Cocaine POSITIVE (A) NONE DETECTED   Benzodiazepines NONE DETECTED NONE DETECTED   Amphetamines NONE DETECTED NONE DETECTED   Tetrahydrocannabinol NONE DETECTED NONE DETECTED   Barbiturates NONE DETECTED NONE DETECTED    Comment:        DRUG SCREEN FOR MEDICAL PURPOSES ONLY.  IF CONFIRMATION IS NEEDED FOR ANY PURPOSE, NOTIFY LAB WITHIN 5 DAYS.        LOWEST DETECTABLE LIMITS FOR URINE DRUG SCREEN Drug Class       Cutoff (ng/mL) Amphetamine      1000 Barbiturate      200 Benzodiazepine   161 Tricyclics       096 Opiates          300 Cocaine          300 THC              50     Current Facility-Administered Medications  Medication Dose Route Frequency Provider Last Rate Last Dose  . hydrOXYzine (ATARAX/VISTARIL) tablet 25 mg  25 mg Oral BID Ocie Cornfield T, PA-C   25 mg at 07/14/17 0942  . lamoTRIgine (LAMICTAL) tablet 150 mg  150 mg Oral Daily Leaphart, Kenneth T, PA-C      . nicotine (NICODERM CQ - dosed in mg/24 hours) patch 21 mg  21 mg Transdermal Daily Leaphart, Kenneth T, PA-C      . traZODone (DESYREL) tablet 100 mg  100 mg Oral QHS Ocie Cornfield T, PA-C   100 mg at 07/13/17 2213   Current Outpatient Prescriptions  Medication Sig Dispense Refill  . hydrOXYzine (ATARAX/VISTARIL) 25 MG tablet Take 25 mg by mouth 2 (two) times daily.    Marland Kitchen ibuprofen (ADVIL,MOTRIN) 200 MG tablet Take 200-800 mg by mouth every 6 (six)  hours as needed for headache or moderate pain.    Marland Kitchen lamoTRIgine (LAMICTAL) 150 MG tablet Take 150 mg by mouth daily.    . traZODone (DESYREL) 100 MG tablet Take 100 mg by mouth at bedtime.    Marland Kitchen lactulose (CHRONULAC) 10 GM/15ML solution Take 45 mLs (30 g total) by mouth 2 (two) times daily. (Patient not taking: Reported on 07/13/2017) 240 mL 3  . propranolol (INDERAL) 20 MG tablet Take 1 tablet (20 mg total) by mouth 2 (two) times daily. (Patient not taking: Reported on 07/13/2017) 60 tablet 2    Musculoskeletal: Strength & Muscle Tone: within normal limits Gait & Station: normal Patient leans: N/A  Psychiatric Specialty Exam: Physical Exam  Constitutional: He appears well-developed.  HENT:  Head: Normocephalic.  Respiratory:  Wears continuous O2 due to COPD  Musculoskeletal: Normal range of motion.  Psychiatric: His speech is normal and behavior is normal. Thought content normal. Cognition and memory are normal. He expresses impulsivity. He exhibits a depressed mood.    Review of Systems  Psychiatric/Behavioral: Positive for depression and substance abuse. Negative for hallucinations, memory loss and suicidal ideas. The patient is nervous/anxious. The patient does not have insomnia.   All other systems reviewed and are negative.   Blood pressure 132/61, pulse 74, temperature 99.2 F (37.3 C), temperature source Oral, resp. rate 18, height  $'5\' 8"'y$  (1.727 m), weight 86.2 kg (190 lb), SpO2 92 %.Body mass index is 28.89 kg/m.  General Appearance: Casual  Eye Contact:  Fair  Speech:  Clear and Coherent  Volume:  Normal  Mood:  Anxious and Depressed  Affect:  Congruent and Depressed  Thought Process:  Coherent, Goal Directed and Linear  Orientation:  Full (Time, Place, and Person)  Thought Content:  Logical  Suicidal Thoughts:  No  Homicidal Thoughts:  No  Memory:  Immediate;   Good Recent;   Good Remote;   Fair  Judgement:  Fair  Insight:  Fair  Psychomotor Activity:  Normal   Concentration:  Concentration: Good and Attention Span: Good  Recall:  Good  Fund of Knowledge:  Good  Language:  Good  Akathisia:  No  Handed:  Right  AIMS (if indicated):     Assets:  Agricultural consultant Housing Resilience  ADL's:  Intact  Cognition:  WNL  Sleep:        Treatment Plan Summary: Plan Alcohol induced mood disorder  Discharge Home Follow up with [facility] services for medication management and therapy Follow up with PCP for medical/health concerns Avoid the use of alcohol and illicit drugs  Disposition: No evidence of imminent risk to self or others at present.   Patient does not meet criteria for psychiatric inpatient admission. Supportive therapy provided about ongoing stressors. Discussed crisis plan, support from social network, calling 911, coming to the Emergency Department, and calling Suicide Hotline.  Ethelene Hal, NP 07/14/2017 12:34 PM  Patient seen face-to-face for psychiatric evaluation, chart reviewed and case discussed with the physician extender and developed treatment plan. Reviewed the information documented and agree with the treatment plan. Corena Pilgrim, MD

## 2017-07-15 DIAGNOSIS — J449 Chronic obstructive pulmonary disease, unspecified: Secondary | ICD-10-CM | POA: Diagnosis present

## 2017-07-15 DIAGNOSIS — F17210 Nicotine dependence, cigarettes, uncomplicated: Secondary | ICD-10-CM | POA: Diagnosis present

## 2017-07-15 DIAGNOSIS — G4700 Insomnia, unspecified: Secondary | ICD-10-CM | POA: Diagnosis not present

## 2017-07-15 DIAGNOSIS — Z91410 Personal history of adult physical and sexual abuse: Secondary | ICD-10-CM | POA: Diagnosis not present

## 2017-07-15 DIAGNOSIS — Z818 Family history of other mental and behavioral disorders: Secondary | ICD-10-CM | POA: Diagnosis not present

## 2017-07-15 DIAGNOSIS — R45851 Suicidal ideations: Secondary | ICD-10-CM | POA: Diagnosis present

## 2017-07-15 DIAGNOSIS — B1920 Unspecified viral hepatitis C without hepatic coma: Secondary | ICD-10-CM | POA: Diagnosis present

## 2017-07-15 DIAGNOSIS — F1094 Alcohol use, unspecified with alcohol-induced mood disorder: Secondary | ICD-10-CM | POA: Diagnosis not present

## 2017-07-15 DIAGNOSIS — F1014 Alcohol abuse with alcohol-induced mood disorder: Secondary | ICD-10-CM | POA: Diagnosis present

## 2017-07-15 DIAGNOSIS — D61818 Other pancytopenia: Secondary | ICD-10-CM | POA: Diagnosis present

## 2017-07-15 DIAGNOSIS — Z915 Personal history of self-harm: Secondary | ICD-10-CM | POA: Diagnosis not present

## 2017-07-15 DIAGNOSIS — Z86010 Personal history of colonic polyps: Secondary | ICD-10-CM | POA: Diagnosis not present

## 2017-07-15 DIAGNOSIS — Z638 Other specified problems related to primary support group: Secondary | ICD-10-CM | POA: Diagnosis not present

## 2017-07-15 DIAGNOSIS — E7220 Disorder of urea cycle metabolism, unspecified: Secondary | ICD-10-CM | POA: Diagnosis not present

## 2017-07-15 DIAGNOSIS — I8500 Esophageal varices without bleeding: Secondary | ICD-10-CM | POA: Diagnosis present

## 2017-07-15 DIAGNOSIS — E0789 Other specified disorders of thyroid: Secondary | ICD-10-CM | POA: Diagnosis not present

## 2017-07-15 DIAGNOSIS — Z86718 Personal history of other venous thrombosis and embolism: Secondary | ICD-10-CM | POA: Diagnosis not present

## 2017-07-15 DIAGNOSIS — R079 Chest pain, unspecified: Secondary | ICD-10-CM

## 2017-07-15 DIAGNOSIS — Z0000 Encounter for general adult medical examination without abnormal findings: Secondary | ICD-10-CM | POA: Diagnosis not present

## 2017-07-15 DIAGNOSIS — K7030 Alcoholic cirrhosis of liver without ascites: Secondary | ICD-10-CM | POA: Diagnosis present

## 2017-07-15 DIAGNOSIS — F1494 Cocaine use, unspecified with cocaine-induced mood disorder: Secondary | ICD-10-CM | POA: Diagnosis not present

## 2017-07-15 DIAGNOSIS — D696 Thrombocytopenia, unspecified: Secondary | ICD-10-CM | POA: Diagnosis not present

## 2017-07-15 DIAGNOSIS — F39 Unspecified mood [affective] disorder: Secondary | ICD-10-CM | POA: Diagnosis not present

## 2017-07-15 DIAGNOSIS — F3131 Bipolar disorder, current episode depressed, mild: Secondary | ICD-10-CM | POA: Diagnosis not present

## 2017-07-15 DIAGNOSIS — Z9114 Patient's other noncompliance with medication regimen: Secondary | ICD-10-CM | POA: Diagnosis not present

## 2017-07-15 DIAGNOSIS — R0789 Other chest pain: Secondary | ICD-10-CM | POA: Diagnosis not present

## 2017-07-15 DIAGNOSIS — Q211 Atrial septal defect: Secondary | ICD-10-CM | POA: Diagnosis not present

## 2017-07-15 DIAGNOSIS — D62 Acute posthemorrhagic anemia: Secondary | ICD-10-CM | POA: Diagnosis present

## 2017-07-15 DIAGNOSIS — Z833 Family history of diabetes mellitus: Secondary | ICD-10-CM | POA: Diagnosis not present

## 2017-07-15 DIAGNOSIS — E876 Hypokalemia: Secondary | ICD-10-CM | POA: Diagnosis present

## 2017-07-15 DIAGNOSIS — R195 Other fecal abnormalities: Secondary | ICD-10-CM

## 2017-07-15 DIAGNOSIS — Z8673 Personal history of transient ischemic attack (TIA), and cerebral infarction without residual deficits: Secondary | ICD-10-CM | POA: Diagnosis not present

## 2017-07-15 DIAGNOSIS — Z590 Homelessness: Secondary | ICD-10-CM | POA: Diagnosis not present

## 2017-07-15 DIAGNOSIS — I864 Gastric varices: Secondary | ICD-10-CM | POA: Diagnosis present

## 2017-07-15 DIAGNOSIS — F1010 Alcohol abuse, uncomplicated: Secondary | ICD-10-CM | POA: Diagnosis not present

## 2017-07-15 DIAGNOSIS — K922 Gastrointestinal hemorrhage, unspecified: Secondary | ICD-10-CM | POA: Diagnosis present

## 2017-07-15 DIAGNOSIS — F1410 Cocaine abuse, uncomplicated: Secondary | ICD-10-CM | POA: Diagnosis present

## 2017-07-15 LAB — CBC
HEMATOCRIT: 34.8 % — AB (ref 39.0–52.0)
HEMOGLOBIN: 11.5 g/dL — AB (ref 13.0–17.0)
MCH: 26.9 pg (ref 26.0–34.0)
MCHC: 33 g/dL (ref 30.0–36.0)
MCV: 81.5 fL (ref 78.0–100.0)
Platelets: 52 10*3/uL — ABNORMAL LOW (ref 150–400)
RBC: 4.27 MIL/uL (ref 4.22–5.81)
RDW: 17.2 % — ABNORMAL HIGH (ref 11.5–15.5)
WBC: 2.4 10*3/uL — AB (ref 4.0–10.5)

## 2017-07-15 LAB — TROPONIN I: Troponin I: <0.03 ng/mL (ref <0.03)

## 2017-07-15 LAB — HIV ANTIBODY (ROUTINE TESTING W REFLEX): HIV Screen 4th Generation wRfx: NONREACTIVE

## 2017-07-15 LAB — BASIC METABOLIC PANEL
ANION GAP: 7 (ref 5–15)
BUN: 7 mg/dL (ref 6–20)
CHLORIDE: 108 mmol/L (ref 101–111)
CO2: 22 mmol/L (ref 22–32)
Calcium: 7.9 mg/dL — ABNORMAL LOW (ref 8.9–10.3)
Creatinine, Ser: 0.89 mg/dL (ref 0.61–1.24)
GFR calc Af Amer: >60 mL/min (ref >60)
GFR calc non Af Amer: >60 mL/min (ref >60)
GLUCOSE: 96 mg/dL (ref 65–99)
POTASSIUM: 3.9 mmol/L (ref 3.5–5.1)
Sodium: 137 mmol/L (ref 135–145)

## 2017-07-15 MED ORDER — NITROGLYCERIN 0.4 MG SL SUBL
0.4000 mg | SUBLINGUAL_TABLET | SUBLINGUAL | Status: DC | PRN
  Administered 2017-07-15 (×2): 0.4 mg via SUBLINGUAL
  Filled 2017-07-15: qty 1

## 2017-07-15 NOTE — Progress Notes (Signed)
Pt c/o CP 7/10, states it is a constant, dull pain.  He wears oxygen, 3L, during the night.  Placed on 2L prn.  2 SL NTG given with NO relief of CP.  BP down to 99/54 after 2nd NTG.  Pt asking to drink/eat.  Waiting for GI to see, Occult Blood positive stool.    Will continue to monitor.

## 2017-07-15 NOTE — ED Notes (Signed)
Attempted report x1. 

## 2017-07-15 NOTE — Consult Note (Signed)
[facility] Consult  Referring Provider: Geradine Girt, DO Primary Care Physician:  Dettinger, Fransisca Kaufmann, MD Primary Gastroenterologist: Dr.Rourk  Reason for Consultation:  Drop in hemoglobin, history of cirrhosis, alcohol abuse, history hepatitis C (clearance posttreatment)  HPI: Patrick [name] is a 66 y.o. Caucasian  male presents to the ER on 07/14/2017 with complaints of chest pain and suicidal ideation. States he has been sober and did not have alcohol for 26 months, until the last 6 days, he has been binging on alcohol. He also snorted cocaine. For the last one day he developed suicidal ideations and presented to the ER.  Patient reports dark stools at times, denies recent black stools or bloody bowel movement. He states he needs lactulose twice a day for history of cirrhosis, never has been noncompliant with his medication for the last 6 days. Denies history of prior ascites or need for paracentesis. Reports having an EGD and a colonoscopy at [facility] in 2017. He was told he had a colonic polyp and needed surveillance in a few years.  CT abdomen from 06/30/16, revealed 2 arterial enhancing hepatic lesions in segment 5/8 and 7/8 suspicious for hepatocellular carcinoma. Cirrhotic liver and portal hypertension including splenomegaly and trace perihepatic ascites. Follow-up MRI on 824/17 did not reveal any new lesions or suspicious lesions.  Patient denies any recent weight loss, difficulty swallowing or pain on swallowing. Denies any episodes of hematemesis. States he cleared hepatitis C after 12 weeks of [place] in 2016.  Past Medical History:  Diagnosis Date  . Anxiety   . Bipolar 1 disorder ([place])   . Cirrhosis ([place])   . COPD (chronic obstructive pulmonary disease) ([place])   . Depression   . Heart murmur   . Hepatitis C   . Oxygen deficiency   . Polysubstance abuse   . Stroke [facility])     Past Surgical History:  Procedure Laterality Date  . arm surgery       Prior to Admission medications   Medication Sig Start Date End Date Taking? Authorizing Provider  hydrOXYzine (ATARAX/VISTARIL) 25 MG tablet Take 25 mg by mouth 2 (two) times daily.   Yes [provider]  ibuprofen (ADVIL,MOTRIN) 200 MG tablet Take 200-800 mg by mouth every 6 (six) hours as needed for headache or moderate pain.   Yes [provider]  lamoTRIgine (LAMICTAL) 150 MG tablet Take 150 mg by mouth daily.   Yes [provider]  traZODone (DESYREL) 100 MG tablet Take 100 mg by mouth at bedtime.   Yes [provider]    Current Facility-Administered Medications  Medication Dose Route Frequency Provider Last Rate Last Dose  . acetaminophen (TYLENOL) tablet 650 mg  650 mg Oral Q6H PRN Etta Quill, DO       Or  . acetaminophen (TYLENOL) suppository 650 mg  650 mg Rectal Q6H PRN Etta Quill, DO      . alum & mag hydroxide-simeth (MAALOX/MYLANTA) 200-200-20 MG/5ML suspension 30 mL  30 mL Oral Q6H PRN Muthersbaugh, Jarrett Soho, PA-C      . folic acid (FOLVITE) tablet 1 mg  1 mg Oral Daily Jennette Kettle M, DO   1 mg at 07/15/17 6962  . hydrOXYzine (ATARAX/VISTARIL) tablet 25 mg  25 mg Oral BID Etta Quill, DO   25 mg at 07/15/17 9528  . lamoTRIgine (LAMICTAL) tablet 150 mg  150 mg Oral Daily Jennette Kettle M, DO   150 mg at 07/15/17 1314  . LORazepam (ATIVAN) tablet 1  mg  1 mg Oral Q6H PRN Etta Quill, DO       Or  . LORazepam (ATIVAN) injection 1 mg  1 mg Intravenous Q6H PRN Etta Quill, DO      . multivitamin with minerals tablet 1 tablet  1 tablet Oral Daily Jennette Kettle M, DO   1 tablet at 07/15/17 8416  . nicotine (NICODERM CQ - dosed in mg/24 hours) patch 21 mg  21 mg Transdermal Daily Muthersbaugh, Hannah, PA-C      . nitroGLYCERIN (NITROSTAT) SL tablet 0.4 mg  0.4 mg Sublingual Q5 min PRN Eulogio Bear U, DO   0.4 mg at 07/15/17 1236  . ondansetron (ZOFRAN) tablet 4 mg  4 mg Oral Q6H PRN Etta Quill, DO       Or  .  ondansetron [facility]) injection 4 mg  4 mg Intravenous Q6H PRN Etta Quill, DO      . pantoprazole (PROTONIX) 80 mg in sodium chloride 0.9 % 250 mL (0.32 mg/mL) infusion  8 mg/hr Intravenous Continuous Etta Quill, DO 25 mL/hr at 07/14/17 2357 8 mg/hr at 07/14/17 2357  . [START ON 07/18/2017] pantoprazole (PROTONIX) injection 40 mg  40 mg Intravenous Q12H Jennette Kettle M, DO      . thiamine (VITAMIN B-1) tablet 100 mg  100 mg Oral Daily Jennette Kettle M, DO   100 mg at 07/15/17 6063   Or  . thiamine (B-1) injection 100 mg  100 mg Intravenous Daily Jennette Kettle M, DO      . traZODone (DESYREL) tablet 100 mg  100 mg Oral QHS Jennette Kettle M, DO   100 mg at 07/14/17 2256    Allergies as of 07/14/2017 - Review Complete 07/14/2017  Allergen Reaction Noted  . Shellfish allergy Hives 07/14/2017  . Tramadol Itching 06/27/2016    Family History  Problem Relation Age of Onset  . Arthritis Mother   . Cancer Father        lung  . Diabetes Father   . Mental illness Brother        depression  . Heart disease Paternal Grandmother   . Cancer Paternal Grandmother        skin  . Diabetes Paternal Uncle     Social History   Social History  . Marital status: Divorced    Spouse name: N/A  . Number of children: N/A  . Years of education: N/A   Occupational History  . Not on file.   Social History Main Topics  . Smoking status: Current Every Day Smoker    Packs/day: 0.25    Years: 50.00    Types: Cigarettes  . Smokeless tobacco: Never Used  . Alcohol use Yes     Comment: 26 months alcohol free- relapse on 07/10/17  . Drug use: No  . Sexual activity: No   Other Topics Concern  . Not on file   Social History Narrative  . No narrative on file    Review of Systems: Positive for: GI: Described in detail in HPI.    Gen: Denies any fever, chills, rigors, night sweats, anorexia, fatigue, weakness, malaise, involuntary weight loss, and sleep disorder CV: chest pain, angina,  palpitations,Denies  syncope, orthopnea, PND, peripheral edema, and claudication. Resp: Denies dyspnea, cough, sputum, wheezing, coughing up blood. GU : Denies urinary burning, blood in urine, urinary frequency, urinary hesitancy, nocturnal urination, and urinary incontinence. MS: Denies joint pain or swelling.  Denies muscle weakness, cramps, atrophy.  Derm: Denies  rash, itching, oral ulcerations, hives, unhealing ulcers.  Psych: suicidal ideation Denies depression, anxiety, memory loss, , hallucinations,  and confusion. Heme: Denies bruising, bleeding, and enlarged lymph nodes. Neuro:  Denies any headaches, dizziness, paresthesias. Endo:  Denies any problems with DM, thyroid, adrenal function.  Physical Exam: Vital signs in last 24 hours: Temp:  [97.7 F (36.5 C)] 97.7 F (36.5 C) (09/06 1201) Pulse Rate:  [66-82] 76 (09/06 1245) Resp:  [9-16] 12 (09/06 1122) BP: (99-131)/(11-77) 99/54 (09/06 1245) SpO2:  [91 %-99 %] 93 % (09/06 1245) Weight:  [84.5 kg (186 lb 3.2 oz)] 84.5 kg (186 lb 3.2 oz) (09/06 1201)    General:   Alert,  Well-developed, well-nourished, pleasant and cooperative in NAD Head:  Normocephalic and atraumatic. Eyes:  Sclera clear, no icterus.   Conjunctiva pink. Ears:  Normal auditory acuity. Nose:  No deformity, discharge,  or lesions. Mouth:  No deformity or lesions.  Oropharynx pink & moist. Neck:  Supple; no masses or thyromegaly. Lungs:  Clear throughout to auscultation.   No wheezes, crackles, or rhonchi. No acute distress. Heart:  Regular rate and rhythm; no murmurs, clicks, rubs,  or gallops. Extremities:  Without clubbing or edema. Neurologic:  Alert and  oriented x4;  grossly normal neurologically. Skin:  Intact without significant lesions or rashes. Psych:  Alert and cooperative. Normal mood and affect. Abdomen:  Soft, nontender and nondistended. No masses, hepatosplenomegaly or hernias noted. Normal bowel sounds, without guarding, and without rebound.          Lab Results:  Recent Labs  07/13/17 1340 07/14/17 1830 07/15/17 0528  WBC 7.8 4.4 2.4*  HGB 14.4 12.2* 11.5*  HCT 40.8 36.3* 34.8*  PLT 98* 55* 52*   BMET  Recent Labs  07/13/17 1340 07/14/17 1830 07/15/17 0528  NA 136 137 137  K 3.4* 3.2* 3.9  CL 107 106 108  CO2 18* 21* 22  GLUCOSE 85 92 96  BUN 9 6 7   CREATININE 0.80 0.86 0.89  CALCIUM 8.6* 8.4* 7.9*   LFT  Recent Labs  07/13/17 1340  PROT 7.6  ALBUMIN 3.5  AST 54*  ALT 26  ALKPHOS 132*  BILITOT 1.9*   PT/INR No results for input(s): LABPROT, INR in the last 72 hours.  Studies/Results: Dg Chest 2 View  Result Date: 07/14/2017 CLINICAL DATA:  Chest pain. EXAM: CHEST  2 VIEW COMPARISON:  Radiographs of Mar 22, 2017. FINDINGS: The heart size and mediastinal contours are within normal limits. Both lungs are clear. No pneumothorax or pleural effusion is noted. The visualized skeletal structures are unremarkable. IMPRESSION: No active cardiopulmonary disease. Electronically Signed   By: Marijo Conception, M.D.   On: 07/14/2017 19:45    Impression: 1. History of cirrhosis, hepatitis C status post treatment and clearance as per patient, alcohol abuse 2. Cocaine abuse(positive urine toxicology) 3. Hemoglobin 12.2 yesterday,11.5 today without obvious hematemesis/ melena/ hematochezia 4. Thrombocytopenia 5. Normal BUN/creatinine ratio, upper GI bleed less likely 6. FOBT positive stool  Plan: 1. No signs of active GI bleeding. Recommend DC IV Protonix drip. Okay to place a patient on by mouth Protonix once a day.  2. Monitor H&H, plan to keep hemoglobin above 7  Please obtain records from [facility] from August 2017 of endoscopy and colonoscopy.  Start the patient on clear liquid diet, if hemoglobin remains stable and still no signs of obvious GI bleed May start regular diet in a.m..     LOS: 0 days   Ronnette Juniper,  M.D. 07/15/2017, 4:31 PM  Pager [phone] If no answer or after 5 PM call  [phone]

## 2017-07-15 NOTE — Progress Notes (Signed)
Called by Jarrett Soho Muthersbaugh PA-C: she says that per TTS consult patient needs inpatient psych treatment when discharged from our service.

## 2017-07-15 NOTE — Progress Notes (Signed)
PROGRESS NOTE    Patrick [name]  IRS:[id number] DOB: 1950-12-10 DOA: 07/14/2017 PCP: Dettinger, Fransisca Kaufmann, MD   Outpatient Specialists:     Brief Narrative:  Patrick [name] is a 66 y.o. male with medical history significant of EtOH abuse, cocaine abuse, cirrhosis, HCV, apparently esophageal and gastric varices.  Patient presents to the ED for 2nd time in 2 days with c/o feeling suicidal and having chest pain.  He was cleared from a psych perspective from [facility] ED yesterday.  Chest pain onset 3 days ago, after using cocaine last.  Is a tightness quality, center chest, no radiation, nothing makes better or worse, constant since onset.  Upper EGD and colonoscopy last done at [facility] Aug 2017, results not available. Heme + stools with close to 3 gram drop in Hgb.     Assessment & Plan:   Principal Problem:   Occult blood positive stool Active Problems:   Cirrhosis (HCC)   ETOH abuse   Thrombocytopenia (HCC)   Chest pain   Occult blood positive stool - 3 gm HGB drop in last few days (not sure if volume dilution-- unsure if patient has gotten fluid in the previous ER visits) No report of stigmata of GIB though but concern given patient seems likely high risk for major GI bleed Apparent h/o varicies based on diagnosis from EGD procedure (that we cant see report of) from [place] last year H/o cirrhosis Chronic thrombocytopenia PPI GTT NPO after midnight [facility] consult  H/o EtOH abuse/coaine abuse  - CIWA  Suicidal ideations  -seen in consult in ER with TTS-- recommending inpatient psych  -have place consult to [facility] now that patient is in observation status  -claims to still have those thoughts currently  Chest pain - probably associated with cocaine use -CE negative     DVT prophylaxis:  SCD's  Code Status: Full Code   Family Communication:   Disposition Plan:  inpatient psych   Consultants:   GI  psych   Subjective: Still having thoughts of hurting  himself  Objective: Vitals:   07/15/17 0642 07/15/17 0953 07/15/17 1122 07/15/17 1201  BP: 110/62 118/68 (!) 122/11 124/73  Pulse: 77 77 66 76  Resp: 15 16 12    Temp:    97.7 F (36.5 C)  TempSrc:    Oral  SpO2: 96% 93% 99% 91%  Weight:    84.5 kg (186 lb 3.2 oz)  Height:    5\' 8"  (1.727 m)   No intake or output data in the 24 hours ending 07/15/17 1204 Filed Weights   07/15/17 1201  Weight: 84.5 kg (186 lb 3.2 oz)    Examination:  General exam: in the ER hallway Respiratory system: Clear to auscultation. Respiratory effort normal. Cardiovascular system: S1 & S2 heard, RRR. No JVD, murmurs, rubs, gallops or clicks. No pedal edema. Gastrointestinal system: +Bs, non-tender Central nervous system: Alert- answers questions appropriately Extremities: moves all 4 ext.     Data Reviewed: I have personally reviewed following labs and imaging studies  CBC:  Recent Labs Lab 07/13/17 1340 07/14/17 1830 07/15/17 0528  WBC 7.8 4.4 2.4*  HGB 14.4 12.2* 11.5*  HCT 40.8 36.3* 34.8*  MCV 78.9 81.0 81.5  PLT 98* 55* 52*   Basic Metabolic Panel:  Recent Labs Lab 07/13/17 1340 07/14/17 1830 07/15/17 0528  NA 136 137 137  K 3.4* 3.2* 3.9  CL 107 106 108  CO2 18* 21* 22  GLUCOSE 85 92 96  BUN 9 6  7  CREATININE 0.80 0.86 0.89  CALCIUM 8.6* 8.4* 7.9*   GFR: Estimated Creatinine Clearance: 88.7 mL/min (by C-G formula based on SCr of 0.89 mg/dL). Liver Function Tests:  Recent Labs Lab 07/13/17 1340  AST 54*  ALT 26  ALKPHOS 132*  BILITOT 1.9*  PROT 7.6  ALBUMIN 3.5   No results for input(s): LIPASE, AMYLASE in the last 168 hours. No results for input(s): AMMONIA in the last 168 hours. Coagulation Profile: No results for input(s): INR, PROTIME in the last 168 hours. Cardiac Enzymes:  Recent Labs Lab 07/14/17 2340 07/15/17 0528  TROPONINI <0.03 <0.03   BNP (last 3 results) No results for input(s): PROBNP in the last 8760 hours. HbA1C: No results for  input(s): HGBA1C in the last 72 hours. CBG: No results for input(s): GLUCAP in the last 168 hours. Lipid Profile: No results for input(s): CHOL, HDL, LDLCALC, TRIG, CHOLHDL, LDLDIRECT in the last 72 hours. Thyroid Function Tests: No results for input(s): TSH, T4TOTAL, FREET4, T3FREE, THYROIDAB in the last 72 hours. Anemia Panel: No results for input(s): VITAMINB12, FOLATE, FERRITIN, TIBC, IRON, RETICCTPCT in the last 72 hours. Urine analysis:    Component Value Date/Time   COLORURINE YELLOW 03/11/2011 0141   APPEARANCEUR CLEAR 03/11/2011 0141   LABSPEC <1.005 (L) 03/11/2011 0141   PHURINE 6.0 03/11/2011 0141   GLUCOSEU NEGATIVE 03/11/2011 0141   HGBUR MODERATE (A) 03/11/2011 0141   BILIRUBINUR NEGATIVE 03/11/2011 0141   KETONESUR NEGATIVE 03/11/2011 0141   PROTEINUR NEGATIVE 03/11/2011 0141   UROBILINOGEN 0.2 03/11/2011 0141   NITRITE NEGATIVE 03/11/2011 0141   LEUKOCYTESUR NEGATIVE 03/11/2011 0141     )No results found for this or any previous visit (from the past 240 hour(s)).    Anti-infectives    None       Radiology Studies: Dg Chest 2 View  Result Date: 07/14/2017 CLINICAL DATA:  Chest pain. EXAM: CHEST  2 VIEW COMPARISON:  Radiographs of Mar 22, 2017. FINDINGS: The heart size and mediastinal contours are within normal limits. Both lungs are clear. No pneumothorax or pleural effusion is noted. The visualized skeletal structures are unremarkable. IMPRESSION: No active cardiopulmonary disease. Electronically Signed   By: Marijo Conception, M.D.   On: 07/14/2017 19:45        Scheduled Meds: . folic acid  1 mg Oral Daily  . hydrOXYzine  25 mg Oral BID  . lamoTRIgine  150 mg Oral Daily  . multivitamin with minerals  1 tablet Oral Daily  . nicotine  21 mg Transdermal Daily  . [START ON 07/18/2017] pantoprazole  40 mg Intravenous Q12H  . thiamine  100 mg Oral Daily   Or  . thiamine  100 mg Intravenous Daily  . traZODone  100 mg Oral QHS   Continuous Infusions: .  pantoprozole (PROTONIX) infusion 8 mg/hr (07/14/17 2357)     LOS: 0 days    Time spent: 97 min    [place], DO [facility] Pager [phone]  If 7PM-7AM, please contact night-coverage [email or web address] Password [email or web address] 07/15/2017, 12:04 PM
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
## 2017-07-15 NOTE — ED Notes (Signed)
Patient valuables locked with security and patient wanded by security. Patient denies SI and HI

## 2017-07-16 ENCOUNTER — Telehealth: Payer: Self-pay | Admitting: Hematology

## 2017-07-16 ENCOUNTER — Encounter: Payer: Self-pay | Admitting: Hematology

## 2017-07-16 DIAGNOSIS — Z0000 Encounter for general adult medical examination without abnormal findings: Secondary | ICD-10-CM

## 2017-07-16 LAB — COMPREHENSIVE METABOLIC PANEL WITH GFR
ALT: 19 U/L (ref 17–63)
AST: 33 U/L (ref 15–41)
Albumin: 2.6 g/dL — ABNORMAL LOW (ref 3.5–5.0)
Alkaline Phosphatase: 84 U/L (ref 38–126)
Anion gap: 5 (ref 5–15)
BUN: 7 mg/dL (ref 6–20)
CO2: 24 mmol/L (ref 22–32)
Calcium: 8 mg/dL — ABNORMAL LOW (ref 8.9–10.3)
Chloride: 108 mmol/L (ref 101–111)
Creatinine, Ser: 1 mg/dL (ref 0.61–1.24)
GFR calc Af Amer: >60 mL/min (ref >60)
GFR calc non Af Amer: >60 mL/min (ref >60)
Glucose, Bld: 88 mg/dL (ref 65–99)
Potassium: 3.6 mmol/L (ref 3.5–5.1)
Sodium: 137 mmol/L (ref 135–145)
Total Bilirubin: 1.9 mg/dL — ABNORMAL HIGH (ref 0.3–1.2)
Total Protein: 5.9 g/dL — ABNORMAL LOW (ref 6.5–8.1)

## 2017-07-16 LAB — CBC
HCT: 34.1 % — ABNORMAL LOW (ref 39.0–52.0)
Hemoglobin: 11.7 g/dL — ABNORMAL LOW (ref 13.0–17.0)
MCH: 28.4 pg (ref 26.0–34.0)
MCHC: 34.3 g/dL (ref 30.0–36.0)
MCV: 82.8 fL (ref 78.0–100.0)
Platelets: 62 K/uL — ABNORMAL LOW (ref 150–400)
RBC: 4.12 MIL/uL — ABNORMAL LOW (ref 4.22–5.81)
RDW: 17.6 % — ABNORMAL HIGH (ref 11.5–15.5)
WBC: 2.8 K/uL — ABNORMAL LOW (ref 4.0–10.5)

## 2017-07-16 MED ORDER — GI COCKTAIL ~~LOC~~
30.0000 mL | Freq: Three times a day (TID) | ORAL | Status: DC | PRN
  Administered 2017-07-17: 30 mL via ORAL
  Filled 2017-07-16: qty 30

## 2017-07-16 MED ORDER — ZOLPIDEM TARTRATE 5 MG PO TABS: 5.0000 mg | ORAL_TABLET | Freq: Every evening | ORAL | Status: DC | PRN

## 2017-07-16 MED ORDER — PANTOPRAZOLE SODIUM 40 MG PO TBEC
40.0000 mg | DELAYED_RELEASE_TABLET | Freq: Every day | ORAL | Status: DC
  Administered 2017-07-16: 40 mg via ORAL
  Filled 2017-07-16: qty 1

## 2017-07-16 MED ORDER — PANTOPRAZOLE SODIUM 40 MG PO TBEC
40.0000 mg | DELAYED_RELEASE_TABLET | Freq: Two times a day (BID) | ORAL | Status: DC
  Administered 2017-07-16 – 2017-08-03 (×36): 40 mg via ORAL
  Filled 2017-07-16 (×37): qty 1

## 2017-07-16 MED ORDER — SUCRALFATE 1 GM/10ML PO SUSP
1.0000 g | Freq: Two times a day (BID) | ORAL | Status: DC
  Administered 2017-07-16 – 2017-08-03 (×36): 1 g via ORAL
  Filled 2017-07-16 (×35): qty 10

## 2017-07-16 NOTE — Progress Notes (Signed)
MD notified of pt c/o of 8/10 midsternal chest pressure. Per pt he does not have any sob , diaphoresis or n/v. VS stable & charted. Pt also states that nitro or tylenol have been tried but did not help.Will continue to monitor the pt. Hoover Brunette, RN

## 2017-07-16 NOTE — Progress Notes (Signed)
[facility] Gastroenterology Progress Note  Patrick [name] 66 y.o. Jan 08, 1951  CC:  Drop in hemoglobin, history of cirrhosis   Subjective: Patient doing better from GI standpoint. Denied abdominal pain, nausea or vomiting. Bowel movement this morning was normal brown color. Denied black stool or bright red blood per rectum.  ROS : Positive for substernal burning chest pain. Negative for shortness of breath. Positive for suicidal ideation   Objective: Vital signs in last 24 hours: Vitals:   07/15/17 2032 07/16/17 0502  BP: (!) 108/59 (!) 102/55  Pulse: 72 64  Resp: 17 16  Temp: 97.6 F (36.4 C) 98 F (36.7 C)  SpO2: 93% 92%    Physical Exam:  General:  Alert, cooperative, no distress, appears stated age  Head:  Normocephalic, without obvious abnormality, atraumatic  Eyes:  , EOM's intact,   Lungs:   Clear to auscultation bilaterally, respirations unlabored  Heart:  Regular rate and rhythm, S1, S2 normal  Abdomen:   Soft, non-tender, bowel sounds active all four quadrants,  no masses,   Extremities: Extremities normal, atraumatic, no  edema  Pulses: 2+ and symmetric    Lab Results:  Recent Labs  07/15/17 0528 07/16/17 0336  NA 137 137  K 3.9 3.6  CL 108 108  CO2 22 24  GLUCOSE 96 88  BUN 7 7  CREATININE 0.89 1.00  CALCIUM 7.9* 8.0*    Recent Labs  07/13/17 1340 07/16/17 0336  AST 54* 33  ALT 26 19  ALKPHOS 132* 84  BILITOT 1.9* 1.9*  PROT 7.6 5.9*  ALBUMIN 3.5 2.6*    Recent Labs  07/15/17 0528 07/16/17 0336  WBC 2.4* 2.8*  HGB 11.5* 11.7*  HCT 34.8* 34.1*  MCV 81.5 82.8  PLT 52* 62*   No results for input(s): LABPROT, INR in the last 72 hours.    Assessment/Plan: - Mild drop in hemoglobin with occult blood positive stool. Brown color bowel movement now. Denied black tarry stool or bright blood per rectum. - History of cirrhosis. Previous EGD showed small varices. EGD report from 2017 not available to review. Followed by [facility]  - Suicidal  ideation - Cocaine abuse  Recommendations ---------------------------- - Patient's hemoglobin is stable compared to yesterday. BUN is normal. No evidence of overt bleeding. Having normal bowel movements. - Increase Protonix to twice a day for substernal burning sensation. - Advance diet to soft diet -  Monitor hemoglobin. - We will probably sign off tomorrow if his hemoglobin remains stable. - Recommend follow-up with primary GI at [facility] - Dr. Gertie Fey  in 3-4 weeks after discharge   Otis Brace MD, [place] 07/16/2017, 10:45 AM  Pager [phone]  If no answer or after 5 PM call [phone]

## 2017-07-16 NOTE — Progress Notes (Signed)
   07/16/17 1400  Clinical Encounter Type  Visited With Patient;Health care provider  Visit Type Initial  Referral From Physician  Consult/Referral To Chaplain  Spiritual Encounters  Spiritual Needs Prayer;Emotional  Stress Factors  Patient Stress Factors Major life changes;Health changes   Responded to a consult.  Spoke with the nurse before seeing the patient.  Patient was sleepy, bu welcoming.  He explained he had suicidal thoughts but was feeling a little better today.  He asked for prayer and so we prayed.  Offered emotional support and a listening ear.  Will follow as needed

## 2017-07-16 NOTE — Plan of Care (Signed)
Problem: Pain Managment: Goal: General experience of comfort will improve Outcome: Progressing Patient complained of mid anterior chest pain.  PRN Tylenol given, per patient sublingual nitroglycerin previously given not effective.  At reassessment patient appeared to be sleeping as evidenced by patient resting in bed with eyes clothes.  For detailed pain assessment see flowsheets.

## 2017-07-16 NOTE — Telephone Encounter (Signed)
Appt scheduled for the pt to see Dr. Burr Medico on 10/1 at 11am. Pt currently in the hospital. Will mail a letter with the appt date and time.

## 2017-07-16 NOTE — Progress Notes (Addendum)
PROGRESS NOTE    Patrick [name]  IEP:[id number] DOB: Aug 05, 1951 DOA: 07/14/2017 PCP: Dettinger, Fransisca Kaufmann, MD   Brief Narrative:  Patrick [name] is a 66 y.o. male with medical history significant of EtOH abuse, cocaine abuse, cirrhosis, HCV, apparently esophageal and gastric varices.  Patient presents to the ED for 2nd time in 2 days with c/o feeling suicidal and having chest pain.  He was cleared from a psych perspective from [facility] ED yesterday.  Chest pain onset 3 days ago, after using cocaine last.  Is a tightness quality, center chest, no radiation, nothing makes better or worse, constant since onset.  Upper EGD and colonoscopy last done at [facility] Aug 2017, results not available. Heme + stools with close to 3 gram drop in Hgb.     Assessment & Plan:   Principal Problem:   Occult blood positive stool Active Problems:   Cirrhosis (HCC)   ETOH abuse   Thrombocytopenia (HCC)   Chest pain   Suicidal ideations  Occult blood positive stool - -3 gm HGB drop in last few days (not sure if volume dilution-- unsure if patient has gotten fluid in the previous ER visits) -No signs of acute bleeding appreciated -Patient hemoglobin has remained stable and also his BUN has remained within normal limits. -After discussing with GI plans are to continue PPI, and not to pursuit endoscopic procedures currently. -Apparent h/o varicies based on diagnosis from EGD procedure (that we cant see report of) -continue oral PPI BID -continue advancing diet as per GI service rec's.  H/o EtOH abuse/coaine abuse -No active signs of withdrawal. -Will continue monitoring with CIWA score  Suicidal ideations -Patient with active ongoing suicidal thoughts -Has been evaluated by psychiatry is an recommending inpatient admission after medically clear  Chest pain - probably associated with cocaine use and reflux symptoms -Cardiac enzymes negative 3, EKG reassured and no acute abnormalities on  telemetry. -Patient PPI has been adjusted and GI cocktail on as-needed basis order. -Will also use Carafate twice a day.   thrombocytopenia   -most likely associated with underlying cirrhosis -No acute bleeding appreciated -Will continue monitoring platelets trend.    DVT prophylaxis:  SCD's  Code Status: Full Code   Family Communication:  No family at bedside.   Disposition Plan:  Patient will need inpatient psychiatry admission once medically stable. If his hemoglobin remains stable and no further signs of bleeding appreciated patient will be ready for discharge from a medical standpoint.   Consultants:   GI  psych   Subjective: Patient reports some burning sensation in his epigastric area/lower chest. Also continue having active suicidal thoughts. Denies shortness of breath, diaphoresis, melena, hematemesis, headache or any other complaints.  Objective: Vitals:   07/15/17 1245 07/15/17 2032 07/16/17 0502 07/16/17 1200  BP: (!) 99/54 (!) 108/59 (!) 102/55 (!) 115/54  Pulse: 76 72 64 70  Resp:  17 16 16   Temp:  97.6 F (36.4 C) 98 F (36.7 C) 97.8 F (36.6 C)  TempSrc:  Oral Axillary Oral  SpO2: 93% 93% 92% 93%  Weight:   86.1 kg (189 lb 12.8 oz)   Height:        Intake/Output Summary (Last 24 hours) at 07/16/17 1526 Last data filed at 07/16/17 1200  Gross per 24 hour  Intake             1324 ml  Output                4 ml  Net             1320 ml   Filed Weights   07/15/17 1201 07/16/17 0502  Weight: 84.5 kg (186 lb 3.2 oz) 86.1 kg (189 lb 12.8 oz)    Examination: General exam: Patient complaining of mid epigastric/chest burning sensation, no nausea, no vomiting, positive suicidal ideation. Denies any melanotic stools or hematemesis.  Respiratory system: Clear to auscultation bilaterally, normal respiratory effort. Good oxygen saturation on room air.  Cardiovascular system: S1 and S2, RRR. No JVD, no gallops, no rubs.   Gastrointestinal system:  Positive bowel sounds, some mild vague epigastric discomfort with deep palpation, no distention, no guarding.  Central nervous system: Alert, awake and oriented 3, able to follow commands, no focal motor or neurologic deficit appreciated on exam. Extremities: No edema, no cyanosis or clubbing.  Data Reviewed: I have personally reviewed following labs and imaging studies  CBC:  Recent Labs Lab 07/13/17 1340 07/14/17 1830 07/15/17 0528 07/16/17 0336  WBC 7.8 4.4 2.4* 2.8*  HGB 14.4 12.2* 11.5* 11.7*  HCT 40.8 36.3* 34.8* 34.1*  MCV 78.9 81.0 81.5 82.8  PLT 98* 55* 52* 62*   Basic Metabolic Panel:  Recent Labs Lab 07/13/17 1340 07/14/17 1830 07/15/17 0528 07/16/17 0336  NA 136 137 137 137  K 3.4* 3.2* 3.9 3.6  CL 107 106 108 108  CO2 18* 21* 22 24  GLUCOSE 85 92 96 88  BUN 9 6 7 7   CREATININE 0.80 0.86 0.89 1.00  CALCIUM 8.6* 8.4* 7.9* 8.0*   GFR: Estimated Creatinine Clearance: 79.7 mL/min (by C-G formula based on SCr of 1 mg/dL).   Liver Function Tests:  Recent Labs Lab 07/13/17 1340 07/16/17 0336  AST 54* 33  ALT 26 19  ALKPHOS 132* 84  BILITOT 1.9* 1.9*  PROT 7.6 5.9*  ALBUMIN 3.5 2.6*   Cardiac Enzymes:  Recent Labs Lab 07/14/17 2340 07/15/17 0528 07/15/17 1131  TROPONINI <0.03 <0.03 <0.03   Urine analysis:    Component Value Date/Time   COLORURINE YELLOW 03/11/2011 0141   APPEARANCEUR CLEAR 03/11/2011 0141   LABSPEC <1.005 (L) 03/11/2011 0141   PHURINE 6.0 03/11/2011 0141   GLUCOSEU NEGATIVE 03/11/2011 0141   HGBUR MODERATE (A) 03/11/2011 0141   BILIRUBINUR NEGATIVE 03/11/2011 0141   KETONESUR NEGATIVE 03/11/2011 0141   PROTEINUR NEGATIVE 03/11/2011 0141   UROBILINOGEN 0.2 03/11/2011 0141   NITRITE NEGATIVE 03/11/2011 0141   LEUKOCYTESUR NEGATIVE 03/11/2011 0141    Anti-infectives    None       Radiology Studies: Dg Chest 2 View  Result Date: 07/14/2017 CLINICAL DATA:  Chest pain. EXAM: CHEST  2 VIEW COMPARISON:  Radiographs  of Mar 22, 2017. FINDINGS: The heart size and mediastinal contours are within normal limits. Both lungs are clear. No pneumothorax or pleural effusion is noted. The visualized skeletal structures are unremarkable. IMPRESSION: No active cardiopulmonary disease. Electronically Signed   By: Marijo Conception, M.D.   On: 07/14/2017 19:45    Scheduled Meds: . folic acid  1 mg Oral Daily  . hydrOXYzine  25 mg Oral BID  . lamoTRIgine  150 mg Oral Daily  . multivitamin with minerals  1 tablet Oral Daily  . nicotine  21 mg Transdermal Daily  . pantoprazole  40 mg Oral BID  . sucralfate  1 g Oral BID  . thiamine  100 mg Oral Daily  . traZODone  100 mg Oral QHS   Continuous Infusions:    LOS: 1 day  Time spent: 35 min    Barton Dubois, MD [facility] Pager [phone]  If 7PM-7AM, please contact night-coverage [email or web address] Password [email or web address] 07/16/2017, 3:26 PM

## 2017-07-17 ENCOUNTER — Encounter (HOSPITAL_COMMUNITY): Payer: Self-pay

## 2017-07-17 LAB — BASIC METABOLIC PANEL
Anion gap: 8 (ref 5–15)
BUN: 7 mg/dL (ref 6–20)
CALCIUM: 8.1 mg/dL — AB (ref 8.9–10.3)
CO2: 19 mmol/L — ABNORMAL LOW (ref 22–32)
Chloride: 110 mmol/L (ref 101–111)
Creatinine, Ser: 1.05 mg/dL (ref 0.61–1.24)
GFR calc Af Amer: >60 mL/min (ref >60)
GLUCOSE: 121 mg/dL — AB (ref 65–99)
Potassium: 4.2 mmol/L (ref 3.5–5.1)
Sodium: 137 mmol/L (ref 135–145)

## 2017-07-17 LAB — CBC
HCT: 36.3 % — ABNORMAL LOW (ref 39.0–52.0)
Hemoglobin: 12.1 g/dL — ABNORMAL LOW (ref 13.0–17.0)
MCH: 27.6 pg (ref 26.0–34.0)
MCHC: 33.3 g/dL (ref 30.0–36.0)
MCV: 82.9 fL (ref 78.0–100.0)
Platelets: 63 10*3/uL — ABNORMAL LOW (ref 150–400)
RBC: 4.38 MIL/uL (ref 4.22–5.81)
RDW: 17.3 % — AB (ref 11.5–15.5)
WBC: 3.1 10*3/uL — ABNORMAL LOW (ref 4.0–10.5)

## 2017-07-17 MED ORDER — METHOCARBAMOL 500 MG PO TABS
500.0000 mg | ORAL_TABLET | Freq: Two times a day (BID) | ORAL | Status: DC | PRN
  Administered 2017-07-17 – 2017-07-23 (×4): 500 mg via ORAL
  Filled 2017-07-17 (×5): qty 1

## 2017-07-17 NOTE — Clinical Social Work Note (Addendum)
Inpt psych has been recommended for pt. Psych referrals have been faxed to;  [facility]-Fax# [phone]  [facility]- Fax# [phone]  [facility]-Fax# [phone]  [facility]-Fax# [phone] [phone]  [facility]-Fax# [phone]  [facility]-Fax# [phone]  CSW awaiting bed offers. Following for dispo planning.  Shamina Etheridge B. Joline Maxcy Clinical Social Work Dept [employer] Social Worker [phone] 5:02 PM

## 2017-07-17 NOTE — Progress Notes (Signed)
PROGRESS NOTE    Patrick [name]  ZWC:[id number] DOB: 03-10-1951 DOA: 07/14/2017 PCP: Dettinger, Fransisca Kaufmann, MD   Brief Narrative:  Patrick [name] is a 66 y.o. male with medical history significant of EtOH abuse, cocaine abuse, cirrhosis, HCV, apparently esophageal and gastric varices.  Patient presents to the ED for 2nd time in 2 days with c/o feeling suicidal and having chest pain.  He was cleared from a psych perspective from [facility] ED yesterday.  Chest pain onset 3 days ago, after using cocaine last.  Is a tightness quality, center chest, no radiation, nothing makes better or worse, constant since onset.  Upper EGD and colonoscopy last done at [facility] Aug 2017, results not available. Heme + stools with close to 3 gram drop in Hgb.     Assessment & Plan:   Principal Problem:   Occult blood positive stool Active Problems:   Cirrhosis (HCC)   ETOH abuse   Thrombocytopenia (HCC)   Atypical chest pain   Suicidal ideations   Evaluation by medical service required  Occult blood positive stool - -3 gm HGB drop in last few days (not sure if volume dilution-- unsure if patient has gotten fluid in the previous ER visits) -Hgb trending up -Patient hemoglobin has remained stable and also his BUN has remained within normal limits. -Apparently had h/o varicies based on diagnosis from EGD procedure at [place] -continue oral PPI BID -patient has remained stable and is clear from GI stand point for discharge; no endoscopic procedures planned.  - Recommending follow-up with primary GI at [facility] - Dr. Gertie Fey  in 3-4 weeks after discharge  H/o EtOH abuse/coaine abuse -No active signs of withdrawal. -Will continue monitoring with CIWA score  Suicidal ideations -Patient continue experiencing active ongoing suicidal thoughts -Has been evaluated by psychiatry and recommendations given is for inpatient admission -patient is medically clear now and waiting for psychiatry facility   Chest pain  - probably associated with cocaine use, MSK and reflux symptoms -Cardiac enzymes negative 3, EKG reassuring and no acute abnormalities on telemetry. -Patient PPI has been adjusted and GI cocktail on as-needed basis order. -Will continue Carafate twice a day. -start PRN robaxin     Thrombocytopenia   -most likely associated with underlying cirrhosis -No acute bleeding appreciated -Will continue monitoring platelets trend intermittently.    DVT prophylaxis:  SCD's  Code Status: Full Code  Family Communication:  No family at bedside.   Disposition Plan:  Patient will need inpatient psychiatry admission at discharge. He is medically stable and ready to move on once bed available at psych facility.   Consultants:   GI  psych   Subjective: Patient reports improvement in epigastric burning sensation and has not experienced any further episodes of GIB. Some right side chest discomfort with movement, that he feels is associated to MSK pain.  Objective: Vitals:   07/16/17 1200 07/16/17 2106 07/17/17 0811 07/17/17 1108  BP: (!) 115/54 (!) 118/52 (!) 103/54 (!) 112/52  Pulse: 70 73 65 68  Resp: 16 17 16 18   Temp: 97.8 F (36.6 C) 98.1 F (36.7 C) 98 F (36.7 C)   TempSrc: Oral Oral Oral Oral  SpO2: 93% 93% 92% (!) 87%  Weight:      Height:        Intake/Output Summary (Last 24 hours) at 07/17/17 1440 Last data filed at 07/17/17 1400  Gross per 24 hour  Intake             1320  ml  Output                6 ml  Net             1314 ml   Filed Weights   07/15/17 1201 07/16/17 0502  Weight: 84.5 kg (186 lb 3.2 oz) 86.1 kg (189 lb 12.8 oz)    Examination: General exam: afebrile, no SOB, no nausea, no vomiting, no melanotic stools and no hematemesis. Patient reporting right side chest discomfort with movement, he feels is a muscle strain.  Respiratory system: good air movement, no wheezing, no crackles, normal resp effort.  Cardiovascular system: S1 and s2, no rubs, no  gallops; no murmurs.  Gastrointestinal system: no abd pain, no distension, positive BS, no guarding.   Central nervous system: AAOX3, no focal or motor deficit appreciated. Patient CN 2-12 intact.  Extremities: no edema, no cyanosis, no clubbing  Psych: still with active suicidal thoughts; no hallucinations   Data Reviewed: I have personally reviewed following labs and imaging studies  CBC:  Recent Labs Lab 07/13/17 1340 07/14/17 1830 07/15/17 0528 07/16/17 0336 07/17/17 0347  WBC 7.8 4.4 2.4* 2.8* 3.1*  HGB 14.4 12.2* 11.5* 11.7* 12.1*  HCT 40.8 36.3* 34.8* 34.1* 36.3*  MCV 78.9 81.0 81.5 82.8 82.9  PLT 98* 55* 52* 62* 63*   Basic Metabolic Panel:  Recent Labs Lab 07/13/17 1340 07/14/17 1830 07/15/17 0528 07/16/17 0336 07/17/17 0230  NA 136 137 137 137 137  K 3.4* 3.2* 3.9 3.6 4.2  CL 107 106 108 108 110  CO2 18* 21* 22 24 19*  GLUCOSE 85 92 96 88 121*  BUN 9 6 7 7 7   CREATININE 0.80 0.86 0.89 1.00 1.05  CALCIUM 8.6* 8.4* 7.9* 8.0* 8.1*   GFR: Estimated Creatinine Clearance: 75.9 mL/min (by C-G formula based on SCr of 1.05 mg/dL).   Liver Function Tests:  Recent Labs Lab 07/13/17 1340 07/16/17 0336  AST 54* 33  ALT 26 19  ALKPHOS 132* 84  BILITOT 1.9* 1.9*  PROT 7.6 5.9*  ALBUMIN 3.5 2.6*   Cardiac Enzymes:  Recent Labs Lab 07/14/17 2340 07/15/17 0528 07/15/17 1131  TROPONINI <0.03 <0.03 <0.03   Urine analysis:    Component Value Date/Time   COLORURINE YELLOW 03/11/2011 0141   APPEARANCEUR CLEAR 03/11/2011 0141   LABSPEC <1.005 (L) 03/11/2011 0141   PHURINE 6.0 03/11/2011 0141   GLUCOSEU NEGATIVE 03/11/2011 0141   HGBUR MODERATE (A) 03/11/2011 0141   BILIRUBINUR NEGATIVE 03/11/2011 0141   KETONESUR NEGATIVE 03/11/2011 0141   PROTEINUR NEGATIVE 03/11/2011 0141   UROBILINOGEN 0.2 03/11/2011 0141   NITRITE NEGATIVE 03/11/2011 0141   LEUKOCYTESUR NEGATIVE 03/11/2011 0141    Anti-infectives    None      Radiology Studies: No results  found.  Scheduled Meds: . folic acid  1 mg Oral Daily  . hydrOXYzine  25 mg Oral BID  . lamoTRIgine  150 mg Oral Daily  . multivitamin with minerals  1 tablet Oral Daily  . nicotine  21 mg Transdermal Daily  . pantoprazole  40 mg Oral BID  . sucralfate  1 g Oral BID  . thiamine  100 mg Oral Daily  . traZODone  100 mg Oral QHS   Continuous Infusions:    LOS: 2 days    Time spent: 35 min    Barton Dubois, MD [facility] Pager [phone]  If 7PM-7AM, please contact night-coverage [email or web address] Password [email or web address] 07/17/2017, 2:40 PM

## 2017-07-17 NOTE — Progress Notes (Signed)
[facility] Progress Note  Patrick [name] 66 y.o. 07/15/1951  CC:  Drop in hemoglobin, history of cirrhosis   Subjective:  No acute issues overnight. No further bleeding episodes. Hemoglobin stable.  ROS :  substernal burning chest pain resolved Negative for shortness of breath. Positive for suicidal ideation   Objective: Vital signs in last 24 hours: Vitals:   07/16/17 2106 07/17/17 0811  BP: (!) 118/52 (!) 103/54  Pulse: 73 65  Resp: 17 16  Temp: 98.1 F (36.7 C) 98 F (36.7 C)  SpO2: 93% 92%    Physical Exam:  General:  Alert, cooperative, no distress, appears stated age  Head:  Normocephalic, without obvious abnormality, atraumatic  Eyes:  , EOM's intact,   Lungs:   Clear to auscultation bilaterally, respirations unlabored  Heart:  Regular rate and rhythm, S1, S2 normal  Abdomen:   Soft, non-tender, bowel sounds active all four quadrants,  no masses,   Extremities: Extremities normal, atraumatic, no  edema  Pulses: 2+ and symmetric    Lab Results:  Recent Labs  07/16/17 0336 07/17/17 0230  NA 137 137  K 3.6 4.2  CL 108 110  CO2 24 19*  GLUCOSE 88 121*  BUN 7 7  CREATININE 1.00 1.05  CALCIUM 8.0* 8.1*    Recent Labs  07/16/17 0336  AST 33  ALT 19  ALKPHOS 84  BILITOT 1.9*  PROT 5.9*  ALBUMIN 2.6*    Recent Labs  07/16/17 0336 07/17/17 0347  WBC 2.8* 3.1*  HGB 11.7* 12.1*  HCT 34.1* 36.3*  MCV 82.8 82.9  PLT 62* 63*   No results for input(s): LABPROT, INR in the last 72 hours.    Assessment/Plan: - Mild drop in hemoglobin with occult blood positive stool. Brown color bowel movement now. Denied black tarry stool or bright blood per rectum. - History of cirrhosis. Previous EGD showed small varices. EGD report from 2017 not available to review. Followed by [facility]  - Suicidal ideation - Cocaine abuse  Recommendations ---------------------------- - patient's hemoglobin is improving. No evidence of any overt bleeding -  substernal chest discomfort improved with twice a day PPI. - No plan for inpatient endoscopic intervention. - GI will sign off. Call us back if needed. - Recommend follow-up with primary GI at [facility] - Dr. Gertie Fey  in 3-4 weeks after discharge   Otis Brace MD, [place] 07/17/2017, 8:18 AM  Pager [phone]  If no answer or after 5 PM call [phone]

## 2017-07-18 NOTE — Progress Notes (Signed)
PROGRESS NOTE    Patrick [name]  NID:[id number] DOB: 1951-01-25 DOA: 07/14/2017 PCP: Dettinger, Fransisca Kaufmann, MD   Brief Narrative:  Patrick [name] is a 66 y.o. male with medical history significant of EtOH abuse, cocaine abuse, cirrhosis, HCV, apparently esophageal and gastric varices.  Patient presents to the ED for 2nd time in 2 days with c/o feeling suicidal and having chest pain.  He was cleared from a psych perspective from [facility] ED yesterday.  Chest pain onset 3 days ago, after using cocaine last.  Is a tightness quality, center chest, no radiation, nothing makes better or worse, constant since onset.  Upper EGD and colonoscopy last done at [facility] Aug 2017, results not available. Heme + stools with close to 3 gram drop in Hgb.     Assessment & Plan:   Principal Problem:   Occult blood positive stool Active Problems:   Cirrhosis (HCC)   ETOH abuse   Thrombocytopenia (HCC)   Atypical chest pain   Suicidal ideations   Evaluation by medical service required  Occult blood positive stool - -3 gm HGB drop in last few days (not sure if volume dilution-- unsure if patient has gotten fluid in the previous ER visits) -Hgb trending up -Patient hemoglobin has remained stable and also his BUN has remained within normal limits. -Apparently had h/o varicies based on diagnosis from EGD procedure at [place] -continue oral PPI BID -patient has remained stable and is clear from GI stand point for discharge; no endoscopic procedures planned.  - Recommending follow-up with primary GI at [facility] - Dr. Gertie Fey  in 3-4 weeks after discharge  H/o EtOH abuse/coaine abuse -No active signs of withdrawal. -Will continue monitoring with CIWA score  Suicidal ideations -Patient continue experiencing active ongoing suicidal thoughts -Has been evaluated by psychiatry and recommendations given is for inpatient admission -patient is medically clear now and waiting for psychiatry facility   Chest pain  - probably associated with cocaine use, MSK and reflux symptoms -Cardiac enzymes negative 3, EKG reassuring and no acute abnormalities on telemetry. -Patient PPI has been adjusted and GI cocktail on as-needed basis order. -Will continue Carafate twice a day. -start PRN robaxin     Thrombocytopenia   -most likely associated with underlying cirrhosis -No acute bleeding appreciated -Will continue monitoring platelets trend intermittently.    DVT prophylaxis:  SCD's  Code Status: Full Code  Family Communication:  No family at bedside.   Disposition Plan:  Patient will need inpatient psychiatry admission at discharge. He is medically stable and ready to move on once bed available at psych facility.   Consultants:   GI  psych   Subjective: Patient reports improvement in reflux symptoms and CP. Also denies SOB, nausea and vomiting.  Objective: Vitals:   07/18/17 0500 07/18/17 0605 07/18/17 1450 07/18/17 2042  BP: (!) 101/49  (!) 92/42 122/75  Pulse: 70  73 68  Resp: 18  18 18   Temp: 97.8 F (36.6 C)  97.8 F (36.6 C) 97.7 F (36.5 C)  TempSrc: Oral  Oral Oral  SpO2: 92%  93% 96%  Weight:  84.6 kg (186 lb 8 oz)  85 kg (187 lb 6.3 oz)  Height:        Intake/Output Summary (Last 24 hours) at 07/18/17 2203 Last data filed at 07/18/17 1630  Gross per 24 hour  Intake              740 ml  Output  0 ml  Net              740 ml   Filed Weights   07/16/17 0502 07/18/17 0605 07/18/17 2042  Weight: 86.1 kg (189 lb 12.8 oz) 84.6 kg (186 lb 8 oz) 85 kg (187 lb 6.3 oz)    Examination: General exam: afebrile, no SOB, improvement in his chest discomfort and GERD reported. Continue having Suicidal ideation.  Rest of physical examination unchanged from exam performed on 9/8; see below for details:  Respiratory system: good air movement, no wheezing, no crackles, normal resp effort.  Cardiovascular system: S1 and s2, no rubs, no gallops; no murmurs.    Gastrointestinal system: no abd pain, no distension, positive BS, no guarding.   Central nervous system: AAOX3, no focal or motor deficit appreciated. Patient CN 2-12 intact.  Extremities: no edema, no cyanosis, no clubbing  Psych: still with active suicidal thoughts; no hallucinations   Data Reviewed: I have personally reviewed following labs and imaging studies  CBC:  Recent Labs Lab 07/13/17 1340 07/14/17 1830 07/15/17 0528 07/16/17 0336 07/17/17 0347  WBC 7.8 4.4 2.4* 2.8* 3.1*  HGB 14.4 12.2* 11.5* 11.7* 12.1*  HCT 40.8 36.3* 34.8* 34.1* 36.3*  MCV 78.9 81.0 81.5 82.8 82.9  PLT 98* 55* 52* 62* 63*   Basic Metabolic Panel:  Recent Labs Lab 07/13/17 1340 07/14/17 1830 07/15/17 0528 07/16/17 0336 07/17/17 0230  NA 136 137 137 137 137  K 3.4* 3.2* 3.9 3.6 4.2  CL 107 106 108 108 110  CO2 18* 21* 22 24 19*  GLUCOSE 85 92 96 88 121*  BUN 9 6 7 7 7   CREATININE 0.80 0.86 0.89 1.00 1.05  CALCIUM 8.6* 8.4* 7.9* 8.0* 8.1*   GFR: Estimated Creatinine Clearance: 75.4 mL/min (by C-G formula based on SCr of 1.05 mg/dL).   Liver Function Tests:  Recent Labs Lab 07/13/17 1340 07/16/17 0336  AST 54* 33  ALT 26 19  ALKPHOS 132* 84  BILITOT 1.9* 1.9*  PROT 7.6 5.9*  ALBUMIN 3.5 2.6*   Cardiac Enzymes:  Recent Labs Lab 07/14/17 2340 07/15/17 0528 07/15/17 1131  TROPONINI <0.03 <0.03 <0.03   Urine analysis:    Component Value Date/Time   COLORURINE YELLOW 03/11/2011 0141   APPEARANCEUR CLEAR 03/11/2011 0141   LABSPEC <1.005 (L) 03/11/2011 0141   PHURINE 6.0 03/11/2011 0141   GLUCOSEU NEGATIVE 03/11/2011 0141   HGBUR MODERATE (A) 03/11/2011 0141   BILIRUBINUR NEGATIVE 03/11/2011 0141   KETONESUR NEGATIVE 03/11/2011 0141   PROTEINUR NEGATIVE 03/11/2011 0141   UROBILINOGEN 0.2 03/11/2011 0141   NITRITE NEGATIVE 03/11/2011 0141   LEUKOCYTESUR NEGATIVE 03/11/2011 0141    Anti-infectives    None      Radiology Studies: No results found.  Scheduled  Meds: . folic acid  1 mg Oral Daily  . hydrOXYzine  25 mg Oral BID  . lamoTRIgine  150 mg Oral Daily  . multivitamin with minerals  1 tablet Oral Daily  . nicotine  21 mg Transdermal Daily  . pantoprazole  40 mg Oral BID  . sucralfate  1 g Oral BID  . thiamine  100 mg Oral Daily  . traZODone  100 mg Oral QHS   Continuous Infusions:    LOS: 3 days    Time spent: 30 min    Barton Dubois, MD [facility] Pager [phone]  If 7PM-7AM, please contact night-coverage [email or web address] Password [email or web address] 07/18/2017, 10:03 PM

## 2017-07-19 LAB — BASIC METABOLIC PANEL
ANION GAP: 4 — AB (ref 5–15)
BUN: 7 mg/dL (ref 6–20)
CALCIUM: 8.3 mg/dL — AB (ref 8.9–10.3)
CO2: 23 mmol/L (ref 22–32)
Chloride: 112 mmol/L — ABNORMAL HIGH (ref 101–111)
Creatinine, Ser: 1.07 mg/dL (ref 0.61–1.24)
GFR calc Af Amer: >60 mL/min (ref >60)
GFR calc non Af Amer: >60 mL/min (ref >60)
GLUCOSE: 100 mg/dL — AB (ref 65–99)
Potassium: 3.8 mmol/L (ref 3.5–5.1)
Sodium: 139 mmol/L (ref 135–145)

## 2017-07-19 LAB — CBC
HCT: 37.8 % — ABNORMAL LOW (ref 39.0–52.0)
Hemoglobin: 12.5 g/dL — ABNORMAL LOW (ref 13.0–17.0)
MCH: 27.5 pg (ref 26.0–34.0)
MCHC: 33.1 g/dL (ref 30.0–36.0)
MCV: 83.3 fL (ref 78.0–100.0)
Platelets: 58 10*3/uL — ABNORMAL LOW (ref 150–400)
RBC: 4.54 MIL/uL (ref 4.22–5.81)
RDW: 17.6 % — AB (ref 11.5–15.5)
WBC: 3.6 10*3/uL — AB (ref 4.0–10.5)

## 2017-07-19 NOTE — Plan of Care (Signed)
Problem: Health Behavior/Discharge Planning: Goal: Ability to manage health-related needs will improve Outcome: Progressing Social Work consulted and involved to assist with patient's discharge.

## 2017-07-19 NOTE — Progress Notes (Signed)
CSW followed up on referrals made to inpatient behavioral health centers Saturday 9/8. CSW also made referral to [facility]. [facility] has no beds available. CSW unable to reach anyone at [facility] awaiting calls back from [facility], [facility], [facility], and [facility]. CSW to follow and support with inpatient behavioral health placement.   Estanislado Emms, [place]

## 2017-07-19 NOTE — Progress Notes (Signed)
PROGRESS NOTE    Patrick [name]  GQQ:[id number] DOB: 06-26-1951 DOA: 07/14/2017 PCP: Dettinger, Fransisca Kaufmann, MD   Brief Narrative:  Patrick [name] is a 66 y.o. male with medical history significant of EtOH abuse, cocaine abuse, cirrhosis, HCV, apparently esophageal and gastric varices.  Patient presents to the ED for 2nd time in 2 days with c/o feeling suicidal and having chest pain.  He was cleared from a psych perspective from [facility] ED yesterday.  Chest pain onset 3 days ago, after using cocaine last.  Is a tightness quality, center chest, no radiation, nothing makes better or worse, constant since onset.  Upper EGD and colonoscopy last done at [facility] Aug 2017, results not available. Heme + stools with close to 3 gram drop in Hgb.     Assessment & Plan:   Principal Problem:   Occult blood positive stool Active Problems:   Cirrhosis (HCC)   ETOH abuse   Thrombocytopenia (HCC)   Atypical chest pain   Suicidal ideations   Evaluation by medical service required  Occult blood positive stool - -3 gm HGB drop in last few days (not sure if volume dilution-- unsure if patient has gotten fluid in the previous ER visits) -Hgb trending up -Patient hemoglobin has remained stable and also his BUN has remained within normal limits. -Apparently had h/o varicies based on diagnosis from EGD procedure at [place] -continue oral PPI BID -patient has remained stable and is clear from GI stand point for discharge; no endoscopic procedures planned.  - Recommending follow-up with primary GI at [facility] - Dr. Gertie Fey  in 3-4 weeks after discharge  H/o EtOH abuse/coaine abuse -No active signs of withdrawal. -Will continue monitoring with CIWA score  Suicidal ideations -Patient continue experiencing active ongoing suicidal thoughts -Has been evaluated by psychiatry and recommendations given is for inpatient admission -patient is medically clear now and waiting for psychiatry facility   Chest pain  - probably associated with cocaine use, MSK and reflux symptoms -Cardiac enzymes negative 3, EKG reassuring and no acute abnormalities on telemetry. -Patient PPI has been adjusted and GI cocktail on as-needed basis order. -Will continue Carafate twice a day. -start PRN robaxin     Thrombocytopenia   -most likely associated with underlying cirrhosis -No acute bleeding appreciated -Will continue monitoring platelets trend intermittently.    DVT prophylaxis:  SCD's  Code Status: Full Code  Family Communication:  No family at bedside.   Disposition Plan:  Patient will need inpatient psychiatry admission at discharge. He is medically stable and ready to move on once bed available at psych facility.   Consultants:   GI  psych   Subjective: Patient w/o CP, no nausea, no vomiting and afebrile. Patient still having suicidal thoughts. No signs of bleeding.  Objective: Vitals:   07/18/17 1450 07/18/17 2042 07/19/17 0528 07/19/17 0746  BP: (!) 92/42 122/75 (!) 99/53 105/61  Pulse: 73 68 63   Resp: 18 18 17    Temp: 97.8 F (36.6 C) 97.7 F (36.5 C) 97.8 F (36.6 C)   TempSrc: Oral Oral Oral   SpO2: 93% 96% 97%   Weight:  84 kg (185 lb 3 oz) 83.3 kg (183 lb 11.2 oz)   Height:        Intake/Output Summary (Last 24 hours) at 07/19/17 1228 Last data filed at 07/19/17 0811  Gross per 24 hour  Intake             1030 ml  Output  0 ml  Net             1030 ml   Filed Weights   07/18/17 0605 07/18/17 2042 07/19/17 0528  Weight: 84.6 kg (186 lb 8 oz) 84 kg (185 lb 3 oz) 83.3 kg (183 lb 11.2 oz)    Examination: General exam: afebrile, no SOB, reports some reflux symptoms, but denies CP. Continue having suicidal ideation and feel psychiatrically unstable.  Respiratory system: good air movement, no wheezing, no crackles Cardiovascular system: S1 and S2, no rubs, no gallops, no murmurs.  Gastrointestinal system: No abd pain, no nausea, no vomiting, no  guarding. Central nervous system: AAOX3, no focal or motor deficit appreciated. CN 2-12 intact. Extremities: no edema, no cyanosis, no clubbing   Psych: still with active suicidal thoughts and feeling psychiatrically stable.  Data Reviewed: I have personally reviewed following labs and imaging studies  CBC:  Recent Labs Lab 07/14/17 1830 07/15/17 0528 07/16/17 0336 07/17/17 0347 07/19/17 0155  WBC 4.4 2.4* 2.8* 3.1* 3.6*  HGB 12.2* 11.5* 11.7* 12.1* 12.5*  HCT 36.3* 34.8* 34.1* 36.3* 37.8*  MCV 81.0 81.5 82.8 82.9 83.3  PLT 55* 52* 62* 63* 58*   Basic Metabolic Panel:  Recent Labs Lab 07/14/17 1830 07/15/17 0528 07/16/17 0336 07/17/17 0230 07/19/17 0155  NA 137 137 137 137 139  K 3.2* 3.9 3.6 4.2 3.8  CL 106 108 108 110 112*  CO2 21* 22 24 19* 23  GLUCOSE 92 96 88 121* 100*  BUN 6 7 7 7 7   CREATININE 0.86 0.89 1.00 1.05 1.07  CALCIUM 8.4* 7.9* 8.0* 8.1* 8.3*   GFR: Estimated Creatinine Clearance: 73.4 mL/min (by C-G formula based on SCr of 1.07 mg/dL).   Liver Function Tests:  Recent Labs Lab 07/13/17 1340 07/16/17 0336  AST 54* 33  ALT 26 19  ALKPHOS 132* 84  BILITOT 1.9* 1.9*  PROT 7.6 5.9*  ALBUMIN 3.5 2.6*   Cardiac Enzymes:  Recent Labs Lab 07/14/17 2340 07/15/17 0528 07/15/17 1131  TROPONINI <0.03 <0.03 <0.03   Urine analysis:    Component Value Date/Time   COLORURINE YELLOW 03/11/2011 0141   APPEARANCEUR CLEAR 03/11/2011 0141   LABSPEC <1.005 (L) 03/11/2011 0141   PHURINE 6.0 03/11/2011 0141   GLUCOSEU NEGATIVE 03/11/2011 0141   HGBUR MODERATE (A) 03/11/2011 0141   BILIRUBINUR NEGATIVE 03/11/2011 0141   KETONESUR NEGATIVE 03/11/2011 0141   PROTEINUR NEGATIVE 03/11/2011 0141   UROBILINOGEN 0.2 03/11/2011 0141   NITRITE NEGATIVE 03/11/2011 0141   LEUKOCYTESUR NEGATIVE 03/11/2011 0141    Anti-infectives    None      Radiology Studies: No results found.  Scheduled Meds: . folic acid  1 mg Oral Daily  . hydrOXYzine  25 mg  Oral BID  . lamoTRIgine  150 mg Oral Daily  . multivitamin with minerals  1 tablet Oral Daily  . nicotine  21 mg Transdermal Daily  . pantoprazole  40 mg Oral BID  . sucralfate  1 g Oral BID  . thiamine  100 mg Oral Daily  . traZODone  100 mg Oral QHS   Continuous Infusions:    LOS: 4 days    Time spent: 25 min    Barton Dubois, MD [facility] Pager [phone]  If 7PM-7AM, please contact night-coverage [email or web address] Password [email or web address] 07/19/2017, 12:28 PM

## 2017-07-19 NOTE — Clinical Social Work Note (Signed)
Clinical Social Work Assessment  Patient Details  Name: Patrick [name] MRN: [id number] Date of Birth: Apr 28, 1951  Date of referral:  07/19/17               Reason for consult:  Suicide Risk/Attempt, Facility Placement, Discharge Planning                Permission sought to share information with:  Facility Sport and exercise psychologist, Family Supports Permission granted to share information::  Yes, Verbal Permission Granted  Name::     [place]::  Inpatient Behavioral Health facilities  Relationship::  son  Contact Information:  [phone]  Housing/Transportation Living arrangements for the past 2 months:  [place], Homeless ([employer]) Source of Information:  Patient Patient Interpreter Needed:  None Criminal Activity/Legal Involvement Pertinent to Current Situation/Hospitalization:  No - Comment as needed Significant Relationships:  Adult Children, Other Family Members Lives with:  Self Do you feel safe going back to the place where you live?  No Need for family participation in patient care:  No (Coment)  Care giving concerns: Patient from [facility] for last three months. Patient unstably housed. No family at bedside. Patient has suicidal ideation and has sitter in room.  Social Worker assessment / plan: CSW met with patient at bedside. Patient continues to endorse suicidal thoughts "Some, but I'm more depressed than anything" and states this has been going on for two to three weeks. Patient denies any past suicide attempts. Protective factor is family by patient report. Patient has family in [place] and [place]. Patient is unable to stay with family due to tensions over patient's substance use; patient endorses ETOH relapse and UDS positive for cocaine. Patient reports he needs O2 at night to sleep. CSW to follow up with inpatient psych referrals made over the weekend and will support with placement.  Employment status:  Disabled (Comment on whether or not currently  receiving Disability) Insurance information:  Medicare PT Recommendations:  Not assessed at this time Information / Referral to community resources:  Inpatient Psychiatric Care (Comment Required) (suicidal thoughts)  Patient/Family's Response to care: Patient appreciative of care.  Patient/Family's Understanding of and Emotional Response to Diagnosis, Current Treatment, and Prognosis: Patient with good understanding of his medical condition and mental health condition. Patient agreeable to voluntary inpatient psychiatric placement. Patient hopeful support in recovery during placement.  Emotional Assessment Appearance:  Appears stated age Attitude/Demeanor/Rapport:  Other (appropriate) Affect (typically observed):  Calm Orientation:  Oriented to Self, Oriented to Place, Oriented to  Time, Oriented to Situation Alcohol / Substance use:  Illicit Drugs, Alcohol Use Psych involvement (Current and /or in the community):  Yes (Comment)  Discharge Needs  Concerns to be addressed:  Discharge Planning Concerns, Mental Health Concerns, Homelessness, Substance Abuse Concerns Readmission within the last 30 days:  No Current discharge risk:  Homeless, Substance Abuse, Psychiatric Illness Barriers to Discharge:  Continued Medical Work up   Estanislado Emms, LCSW 07/19/2017, 2:02 PM
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
## 2017-07-19 NOTE — Care Management Important Message (Signed)
Important Message  Patient Details  Name: Patrick [name] MRN: [id number] Date of Birth: 1951-04-12   Medicare Important Message Given:  Yes    [name] May 07/19/2017, 9:49 AM

## 2017-07-20 NOTE — Plan of Care (Signed)
Problem: Activity: Goal: Risk for activity intolerance will decrease Outcome: Progressing Patient walks to and from bathroom, no reports of shortness of breath.

## 2017-07-20 NOTE — Care Management Note (Addendum)
Case Management Note  Patient Details  Name: Patrick [name] MRN: [id number] Date of Birth: [birth year]  Subjective/Objective:  Pt presented for Esophageal and Gastric Varices. Pt has hx of ETOH/ Cocaine Abuse. Pt is being followed by Psych 2/2 suicidal ideations. Psych did recommend Inpatient Psych Facility.                    Action/Plan: CSW is following for Inpatient Psych Facility. CM will continue to monitor as well.   Expected Discharge Date:                  Expected Discharge Plan:  [place]  In-House Referral:  Clinical Social Work  Discharge planning Services  CM Consult  Post Acute Care Choice:  IP Rehab Choice offered to:  NA  DME Arranged:  N/A DME Agency:  NA  HH Arranged:  NA HH Agency:  NA  Status of Service:  Completed, signed off  If discussed at [employer] of Stay Meetings, dates discussed:  07-20-17, 07-27-17, 07-29-17  Additional Comments: 07-29-17 Jacqlyn Krauss, RN,BSN [phone] Medically Stable- Awaiting Inpatient Psych Bed- Pt is on the list for [facility].   Medically Stable- Awaiting Inpatient Psych Bed- Pt is on the list for [facility]. CM will continue to monitor. 07-28-17 Jacqlyn Krauss, RN,BSN   Adelfa Koh Ocie Cornfield, RN 07/20/2017, 12:21 PM

## 2017-07-20 NOTE — Progress Notes (Signed)
CSW followed up on referrals to inpatient behavioral health. Patient was denied for medical reasons by [facility] and [facility]. [facility] and [facility] do not have beds available. CSW awaiting calls back from additional facilities including [facility]. CSW made new referrals to [facility], [facility], and [facility]. CSW to continue to follow and support with discharge to inpatient behavioral unit.  Patrick [name], [place]

## 2017-07-20 NOTE — Progress Notes (Signed)
PROGRESS NOTE    Patrick [name]  MVE:[id number] DOB: 06-30-1951 DOA: 07/14/2017 PCP: Dettinger, Fransisca Kaufmann, MD   Brief Narrative:  Patrick [name] is a 66 y.o. male with medical history significant of EtOH abuse, cocaine abuse, cirrhosis, HCV, apparently esophageal and gastric varices.  Patient presents to the ED for 2nd time in 2 days with c/o feeling suicidal and having chest pain.  He was cleared from a psych perspective from [facility] ED yesterday.  Chest pain onset 3 days ago, after using cocaine last.  Is a tightness quality, center chest, no radiation, nothing makes better or worse, constant since onset.  Upper EGD and colonoscopy last done at [facility] Aug 2017, results not available. Heme + stools with close to 3 gram drop in Hgb.     Assessment & Plan:   Principal Problem:   Occult blood positive stool Active Problems:   Cirrhosis (HCC)   ETOH abuse   Thrombocytopenia (HCC)   Atypical chest pain   Suicidal ideations   Evaluation by medical service required  Occult blood positive stool - -3 gm HGB drop in last few days (not sure if volume dilution-- unsure if patient has gotten fluid in the previous ER visits) -Hgb trending up -Patient hemoglobin has remained stable and also his BUN has remained within normal limits. -Apparently had h/o varicies based on diagnosis from EGD procedure at [place] -continue oral PPI BID -patient has remained stable and is clear from GI stand point for discharge; no endoscopic procedures planned.  - Recommending follow-up with primary GI at [facility] - Dr. Gertie Fey  in 3-4 weeks after discharge  Hx oc COPD -no wheezing -good O2 sat on RA -continue PRN albuterol   H/o EtOH abuse/coaine abuse -stable -no signs of withdrawal. -Will continue monitoring with CIWA score  Suicidal ideations: present and ongoing. Even now is intermittent. -Patient has been re-evaluated by psychiatry and and recommendations are for inpatient therapy -waiting on bed  availability at psych facility  Chest pain - probably associated with cocaine use, MSK and reflux symptoms. -Cardiac enzymes negative 3, EKG reassuring and no acute abnormalities on telemetry. -Patient PPI has been adjusted and GI cocktail on as-needed basis order. -will also continue carafate BID and PRN robaxin   -currently no complaining of CP   Thrombocytopenia: unchanged and unstable  -most likely associated with underlying cirrhosis -No acute bleeding appreciated -Will continue monitoring platelets trend intermittently.    DVT prophylaxis:  SCD's  Code Status: Full Code  Family Communication:  No family at bedside.   Disposition Plan:  Patient will need inpatient psychiatry admission at discharge. He is medically stable and ready to move on once bed available at psych facility.   Consultants:   GI  psych   Subjective: Patient continue to have SI. No CP, no SOB, no nausea, no vomiting.    Objective: Vitals:   07/19/17 1900 07/20/17 0530 07/20/17 1419 07/20/17 1719  BP: 123/62 (!) 101/49 108/61 113/66  Pulse: 74 72 65 72  Resp: 18 18 16    Temp: 98 F (36.7 C) 97.6 F (36.4 C) 98.3 F (36.8 C)   TempSrc: Oral  Oral   SpO2: 93% 91% 96%   Weight:  83 kg (183 lb)    Height:        Intake/Output Summary (Last 24 hours) at 07/20/17 1941 Last data filed at 07/20/17 1600  Gross per 24 hour  Intake  1340 ml  Output                0 ml  Net             1340 ml   Filed Weights   07/18/17 2042 07/19/17 0528 07/20/17 0530  Weight: 84 kg (185 lb 3 oz) 83.3 kg (183 lb 11.2 oz) 83 kg (183 lb)    Examination: General exam: afebrile, no SOB, no CP. Continue to have intermittent episodes of SI currently and feeling psychiatrically unstable. No nausea, no vomiting, no abd pain, and achieving good BM's.    Physical exam unchanged from examination on 07/19/17; please see below for details.  Respiratory system: good air movement, no wheezing, no  crackles Cardiovascular system: S1 and S2, no rubs, no gallops, no murmurs.  Gastrointestinal system: No abd pain, no nausea, no vomiting, no guarding. Central nervous system: AAOX3, no focal or motor deficit appreciated. CN 2-12 intact. Extremities: no edema, no cyanosis, no clubbing   Psych: still with active suicidal thoughts and feeling psychiatrically stable.  Data Reviewed: I have personally reviewed following labs and imaging studies  CBC:  Recent Labs Lab 07/14/17 1830 07/15/17 0528 07/16/17 0336 07/17/17 0347 07/19/17 0155  WBC 4.4 2.4* 2.8* 3.1* 3.6*  HGB 12.2* 11.5* 11.7* 12.1* 12.5*  HCT 36.3* 34.8* 34.1* 36.3* 37.8*  MCV 81.0 81.5 82.8 82.9 83.3  PLT 55* 52* 62* 63* 58*   Basic Metabolic Panel:  Recent Labs Lab 07/14/17 1830 07/15/17 0528 07/16/17 0336 07/17/17 0230 07/19/17 0155  NA 137 137 137 137 139  K 3.2* 3.9 3.6 4.2 3.8  CL 106 108 108 110 112*  CO2 21* 22 24 19* 23  GLUCOSE 92 96 88 121* 100*  BUN 6 7 7 7 7   CREATININE 0.86 0.89 1.00 1.05 1.07  CALCIUM 8.4* 7.9* 8.0* 8.1* 8.3*   GFR: Estimated Creatinine Clearance: 73.2 mL/min (by C-G formula based on SCr of 1.07 mg/dL).   Liver Function Tests:  Recent Labs Lab 07/16/17 0336  AST 33  ALT 19  ALKPHOS 84  BILITOT 1.9*  PROT 5.9*  ALBUMIN 2.6*   Cardiac Enzymes:  Recent Labs Lab 07/14/17 2340 07/15/17 0528 07/15/17 1131  TROPONINI <0.03 <0.03 <0.03   Urine analysis:    Component Value Date/Time   COLORURINE YELLOW 03/11/2011 0141   APPEARANCEUR CLEAR 03/11/2011 0141   LABSPEC <1.005 (L) 03/11/2011 0141   PHURINE 6.0 03/11/2011 0141   GLUCOSEU NEGATIVE 03/11/2011 0141   HGBUR MODERATE (A) 03/11/2011 0141   BILIRUBINUR NEGATIVE 03/11/2011 0141   KETONESUR NEGATIVE 03/11/2011 0141   PROTEINUR NEGATIVE 03/11/2011 0141   UROBILINOGEN 0.2 03/11/2011 0141   NITRITE NEGATIVE 03/11/2011 0141   LEUKOCYTESUR NEGATIVE 03/11/2011 0141    Anti-infectives    None       Radiology Studies: No results found.  Scheduled Meds: . folic acid  1 mg Oral Daily  . hydrOXYzine  25 mg Oral BID  . lamoTRIgine  150 mg Oral Daily  . multivitamin with minerals  1 tablet Oral Daily  . nicotine  21 mg Transdermal Daily  . pantoprazole  40 mg Oral BID  . sucralfate  1 g Oral BID  . thiamine  100 mg Oral Daily  . traZODone  100 mg Oral QHS   Continuous Infusions:    LOS: 5 days    Time spent: 20 min    Barton Dubois, MD [facility] Pager [phone]  If 7PM-7AM, please contact night-coverage [email or web address] Password  [email or web address] 07/20/2017, 7:41 PM

## 2017-07-21 NOTE — Plan of Care (Signed)
Problem: Safety: Goal: Ability to remain free from injury will improve Outcome: Progressing Pt has been free of injury. Suicide sitter at bedside. Pt verbalized he isn't experiencing any suicidal thoughts at this time however states when he "stays awake for long periods of time the thoughts come back." Pt asked if he had a plan, he stated "yes I was going to jump off a bridge into traffic." Pt states he "feels like a failure because he was 26 months sober and then went and got drunk." Pt verbalized he is feeling "somewhat better" and the thoughts aren't as often but they are still there. Suicide precautions remain in place as patient is waiting for an IP psych bed.

## 2017-07-21 NOTE — Progress Notes (Signed)
PROGRESS NOTE    HERVE HAUG  UXN:[id number] DOB: 06/15/1951 DOA: 07/14/2017 PCP: Dettinger, Fransisca Kaufmann, MD   Brief Narrative:  Patrick [name] is a 66 y.o. male with medical history significant of EtOH abuse, cocaine abuse, cirrhosis, HCV, apparently esophageal and gastric varices.  Patient presents to the ED for 2nd time in 2 days with c/o feeling suicidal and having chest pain.  He was cleared from a psych perspective from [facility] ED yesterday.  Chest pain onset 3 days ago, after using cocaine last.  Is a tightness quality, center chest, no radiation, nothing makes better or worse, constant since onset.  Upper EGD and colonoscopy last done at [facility] Aug 2017, results not available. Heme + stools with close to 3 gram drop in Hgb.     Assessment & Plan:   Occult blood positive stool - Hgb stable now, apparently had h/o varicies based on diagnosis from EGD procedure at [facility], continue oral PPI BID, patient has remained stable and is clear from GI stand point for discharge; no endoscopic procedures planned. Recommending follow-up with primary GI at [facility] - Dr. Gertie Fey  in 3-4 weeks after discharge.   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 Supportive care.   H/o EtOH abuse/coaine abuse - stable, no signs of withdrawal. Will continue monitoring with CIWA score  Suicidal ideations: present and ongoing. Seen by psychiatry awaits inpatient psych bed. Continues sitter bedside.  Chest pain at the time of admission - probably associated with cocaine use, MSK and reflux symptoms. Cardiac enzymes negative 3, EKG reassuring and no acute abnormalities on telemetry. His chest pain free now.   Thrombocytopenia: unchanged and stable - due to underlying alcoholic cirrhosis, no bleeding. Intermittently monitor.     DVT prophylaxis:  SCD's  Code Status: Full Code  Family Communication:  No family at bedside.   Disposition Plan:  Patient will need inpatient psychiatry  admission at discharge. He is medically stable and ready to move on once bed available at psych facility.   Consultants:   GI  psych   Subjective:  Patient in bed, appears comfortable, denies any headache, no fever, no chest pain or pressure, no shortness of breath , no abdominal pain. No focal weakness.   Objective: Vitals:   07/20/17 1419 07/20/17 1719 07/20/17 2056 07/21/17 0542  BP: 108/61 113/66 110/64 (!) 109/56  Pulse: 65 72 74 70  Resp: 16  17 16   Temp: 98.3 F (36.8 C)  98.1 F (36.7 C) 98.1 F (36.7 C)  TempSrc: Oral  Oral Oral  SpO2: 96%  96% 94%  Weight:      Height:        Intake/Output Summary (Last 24 hours) at 07/21/17 1036 Last data filed at 07/20/17 2210  Gross per 24 hour  Intake              760 ml  Output                0 ml  Net              760 ml   Filed Weights   07/18/17 2042 07/19/17 0528 07/20/17 0530  Weight: 84 kg (185 lb 3 oz) 83.3 kg (183 lb 11.2 oz) 83 kg (183 lb)    Examination:    Awake Alert, Oriented X 3, No new F.N deficits, flat affect [place].AT,PERRAL Supple Neck,No JVD, No cervical lymphadenopathy appriciated.  Symmetrical Chest wall movement, Good air movement bilaterally,  CTAB RRR,No Gallops,Rubs or new Murmurs, No Parasternal Heave +ve B.Sounds, Abd Soft, No tenderness, No organomegaly appriciated, No rebound - guarding or rigidity. No Cyanosis, Clubbing or edema, No new Rash or bruise   Data Reviewed: I have personally reviewed following labs and imaging studies  CBC:  Recent Labs Lab 07/14/17 1830 07/15/17 0528 07/16/17 0336 07/17/17 0347 07/19/17 0155  WBC 4.4 2.4* 2.8* 3.1* 3.6*  HGB 12.2* 11.5* 11.7* 12.1* 12.5*  HCT 36.3* 34.8* 34.1* 36.3* 37.8*  MCV 81.0 81.5 82.8 82.9 83.3  PLT 55* 52* 62* 63* 58*   Basic Metabolic Panel:  Recent Labs Lab 07/14/17 1830 07/15/17 0528 07/16/17 0336 07/17/17 0230 07/19/17 0155  NA 137 137 137 137 139  K 3.2* 3.9 3.6 4.2 3.8  CL 106 108 108 110 112*  CO2 21*  22 24 19* 23  GLUCOSE 92 96 88 121* 100*  BUN 6 7 7 7 7   CREATININE 0.86 0.89 1.00 1.05 1.07  CALCIUM 8.4* 7.9* 8.0* 8.1* 8.3*   GFR: Estimated Creatinine Clearance: 73.2 mL/min (by C-G formula based on SCr of 1.07 mg/dL).   Liver Function Tests:  Recent Labs Lab 07/16/17 0336  AST 33  ALT 19  ALKPHOS 84  BILITOT 1.9*  PROT 5.9*  ALBUMIN 2.6*   Cardiac Enzymes:  Recent Labs Lab 07/14/17 2340 07/15/17 0528 07/15/17 1131  TROPONINI <0.03 <0.03 <0.03   Urine analysis:    Component Value Date/Time   COLORURINE YELLOW 03/11/2011 0141   APPEARANCEUR CLEAR 03/11/2011 0141   LABSPEC <1.005 (L) 03/11/2011 0141   PHURINE 6.0 03/11/2011 0141   GLUCOSEU NEGATIVE 03/11/2011 0141   HGBUR MODERATE (A) 03/11/2011 0141   BILIRUBINUR NEGATIVE 03/11/2011 0141   KETONESUR NEGATIVE 03/11/2011 0141   PROTEINUR NEGATIVE 03/11/2011 0141   UROBILINOGEN 0.2 03/11/2011 0141   NITRITE NEGATIVE 03/11/2011 0141   LEUKOCYTESUR NEGATIVE 03/11/2011 0141    Anti-infectives    None      Radiology Studies: No results found.  Scheduled Meds: . folic acid  1 mg Oral Daily  . hydrOXYzine  25 mg Oral BID  . lamoTRIgine  150 mg Oral Daily  . multivitamin with minerals  1 tablet Oral Daily  . nicotine  21 mg Transdermal Daily  . pantoprazole  40 mg Oral BID  . sucralfate  1 g Oral BID  . thiamine  100 mg Oral Daily  . traZODone  100 mg Oral QHS   Continuous Infusions:    LOS: 6 days    Time spent: 20 min  Signature  Lala Lund M.D on 07/21/2017 at 10:36 AM  Between 7am to 7pm - Pager - [phone] ( page via [email or web address], text pages only, please mention full 10 digit call back number).  After 7pm go to [email or web address] - password [facility]

## 2017-07-21 NOTE — Plan of Care (Signed)
Problem: Physical Regulation: Goal: Will remain free from infection Outcome: Progressing No signs or symptoms of infection noted at this time.

## 2017-07-21 NOTE — Progress Notes (Signed)
CSW continuing to follow up on referrals for inpatient behavioral health. Patient denied by three facilities. [place] and [facility] [place] have no beds. CSW awaiting response from [place] to obtain [name] tomorrow and start application for [facility]. CSW to continue to follow and support with placement.  Patrick [name], [place]

## 2017-07-22 DIAGNOSIS — Z590 Homelessness: Secondary | ICD-10-CM

## 2017-07-22 DIAGNOSIS — Z0000 Encounter for general adult medical examination without abnormal findings: Secondary | ICD-10-CM

## 2017-07-22 DIAGNOSIS — G4700 Insomnia, unspecified: Secondary | ICD-10-CM

## 2017-07-22 DIAGNOSIS — Z818 Family history of other mental and behavioral disorders: Secondary | ICD-10-CM

## 2017-07-22 DIAGNOSIS — Z638 Other specified problems related to primary support group: Secondary | ICD-10-CM

## 2017-07-22 DIAGNOSIS — R0789 Other chest pain: Secondary | ICD-10-CM

## 2017-07-22 NOTE — Progress Notes (Signed)
PROGRESS NOTE    Patrick [name]  PJA:[id number] DOB: [birth year] DOA: 07/14/2017 PCP: Dettinger, Fransisca Kaufmann, MD   Brief Narrative:  Patrick [name] is a 66 y.o. male with medical history significant of EtOH abuse, cocaine abuse, cirrhosis, HCV, apparently esophageal and gastric varices.  Patient presents to the ED for 2nd time in 2 days with c/o feeling suicidal and having chest pain.  He was cleared from a psych perspective from [facility] ED yesterday.  Chest pain onset 3 days ago, after using cocaine last.  Is a tightness quality, center chest, no radiation, nothing makes better or worse, constant since onset.  Upper EGD and colonoscopy last done [facility] Aug [facility], results not available. Heme + stools with close to 3 gram drop in Hgb.      Subjective:  Patient in bed, appears comfortable, denies any headache, no fever, no chest pain or pressure, no shortness of breath , no abdominal pain. No focal weakness.   Assessment & Plan:   Suicidal ideations: present and ongoing. Sitter bedside, he still says he has some suicidal ideations, discussed with psychiatrist on 07/22/2017 he will reevaluate the patient. For now we wait inpatient psych bed for placement.  Occult blood positive stool - Hgb stable now, apparently had h/o varicies based on diagnosis from EGD procedure at [facility], continue oral PPI BID, patient has remained stable and is clear from GI stand point for discharge; no endoscopic procedures planned. Recommending follow-up with primary GI at [facility] - Dr. Gertie Fey  in 3-4 weeks after discharge.   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 Supportive care.   H/o EtOH abuse/coaine abuse - stable, no signs of withdrawal. Will continue monitoring with CIWA score  Chest pain at the time of admission - probably associated with cocaine use, MSK and reflux symptoms. Cardiac enzymes negative 3, EKG reassuring and no acute abnormalities on telemetry. His chest pain free now.   Thrombocytopenia: unchanged and stable - due to underlying alcoholic cirrhosis, no bleeding. Intermittently monitor.     DVT prophylaxis:  SCD's  Code Status: Full Code  Family Communication:  No family at bedside.   Disposition Plan:  Patient will need inpatient psychiatry admission at discharge. He is medically stable and ready to move on once bed available at psych facility.   Consultants:   GI  psych    Objective: Vitals:   07/21/17 0542 07/21/17 1400 07/21/17 1954 07/22/17 0526  BP: (!) 109/56 (!) 108/57 (!) 142/60 97/61  Pulse: 70 74 72 72  Resp: 16 18 17 14   Temp: 98.1 F (36.7 C) 98.1 F (36.7 C) 98 F (36.7 C) 97.9 F (36.6 C)  TempSrc: Oral Oral Oral Oral  SpO2: 94% 100% 94% 92%  Weight:    83.5 kg (184 lb)  Height:        Intake/Output Summary (Last 24 hours) at 07/22/17 1122 Last data filed at 07/22/17 0900  Gross per 24 hour  Intake              480 ml  Output                0 ml  Net              480 ml   Filed Weights   07/19/17 0528 07/20/17 0530 07/22/17 0526  Weight: 83.3 kg (183 lb 11.2 oz) 83 kg (183 lb) 83.5 kg (184 lb)    Examination:  Awake Alert, Oriented X  3, No new F.N deficits, Flat affect, still says he still feels suicidal Patrick [name].AT,PERRAL Supple Neck,No JVD, No cervical lymphadenopathy appriciated.  Symmetrical Chest wall movement, Good air movement bilaterally, CTAB RRR,No Gallops,Rubs or new Murmurs, No Parasternal Heave +ve B.Sounds, Abd Soft, No tenderness, No organomegaly appriciated, No rebound - guarding or rigidity. No Cyanosis, Clubbing or edema, No new Rash or bruise    Data Reviewed: I have personally reviewed following labs and imaging studies  CBC:  Recent Labs Lab 07/16/17 0336 07/17/17 0347 07/19/17 0155  WBC 2.8* 3.1* 3.6*  HGB 11.7* 12.1* 12.5*  HCT 34.1* 36.3* 37.8*  MCV 82.8 82.9 83.3  PLT 62* 63* 58*   Basic Metabolic Panel:  Recent Labs Lab 07/16/17 0336 07/17/17 0230 07/19/17 0155  NA  137 137 139  K 3.6 4.2 3.8  CL 108 110 112*  CO2 24 19* 23  GLUCOSE 88 121* 100*  BUN 7 7 7   CREATININE 1.00 1.05 1.07  CALCIUM 8.0* 8.1* 8.3*   GFR: Estimated Creatinine Clearance: 73.4 mL/min (by C-G formula based on SCr of 1.07 mg/dL).   Liver Function Tests:  Recent Labs Lab 07/16/17 0336  AST 33  ALT 19  ALKPHOS 84  BILITOT 1.9*  PROT 5.9*  ALBUMIN 2.6*   Cardiac Enzymes:  Recent Labs Lab 07/15/17 1131  TROPONINI <0.03   Urine analysis:    Component Value Date/Time   COLORURINE YELLOW 03/11/2011 0141   APPEARANCEUR CLEAR 03/11/2011 0141   LABSPEC <1.005 (L) 03/11/2011 0141   PHURINE 6.0 03/11/2011 0141   GLUCOSEU NEGATIVE 03/11/2011 0141   HGBUR MODERATE (A) 03/11/2011 0141   BILIRUBINUR NEGATIVE 03/11/2011 0141   KETONESUR NEGATIVE 03/11/2011 0141   PROTEINUR NEGATIVE 03/11/2011 0141   UROBILINOGEN 0.2 03/11/2011 0141   NITRITE NEGATIVE 03/11/2011 0141   LEUKOCYTESUR NEGATIVE 03/11/2011 0141    Anti-infectives    None      Radiology Studies: No results found.  Scheduled Meds: . folic acid  1 mg Oral Daily  . hydrOXYzine  25 mg Oral BID  . lamoTRIgine  150 mg Oral Daily  . multivitamin with minerals  1 tablet Oral Daily  . nicotine  21 mg Transdermal Daily  . pantoprazole  40 mg Oral BID  . sucralfate  1 g Oral BID  . thiamine  100 mg Oral Daily  . traZODone  100 mg Oral QHS   Continuous Infusions:    LOS: 7 days    Time spent: 20 min  Signature  Lala Lund M.D on 07/22/2017 at 11:22 AM  Between 7am to 7pm - Pager - [phone] ( page via [email or web address], text pages only, please mention full 10 digit call back number).  After 7pm go to [email or web address] - password [facility]

## 2017-07-22 NOTE — Consult Note (Signed)
[facility] Consult   Reason for Consult:  Suicide without plan and recently relapsed on alcohol and cocaine Referring Physician:  Dr. Eliseo Squires Patient Identification: Patrick [name] MRN:  [id number] Principal Diagnosis: Occult blood positive stool Diagnosis:   Patient Active Problem List   Diagnosis Date Noted  . Evaluation by medical service required [Z00.00]   . Suicidal ideations [R45.851] 07/15/2017  . Alcohol-induced mood disorder ([place]) [F10.94] 07/14/2017  . Occult blood positive stool [R19.5] 07/14/2017  . ETOH abuse [F10.10] 07/14/2017  . Thrombocytopenia ([place]) [D69.6] 07/14/2017  . Atypical chest pain [R07.89] 07/14/2017  . COPD (chronic obstructive pulmonary disease) ([place]) [J44.9] 07/08/2017  . Hepatic encephalopathy ([place]) [K72.90] 01/05/2015  . Bipolar 1 disorder ([place]) [F31.9] 01/05/2015  . Insomnia [G47.00] 01/05/2015  . Anemia, chronic disease [D63.8] 01/05/2015  . Neuropathy [G62.9] 01/05/2015  . Cirrhosis ([place]) [K74.60] 01/05/2015  . History of stroke [Z86.73] 01/05/2015  . Altered mental status [R41.82]     Total Time spent with patient: 1 hour  Subjective:   Patrick [name] is a 66 y.o. male patient admitted with substance abuse and suicide ideation.  HPI: Patrick [name] is a 66 years old male admitted to the: [facility] for thrombocytopenia, occult blood positive stool, chest pain, cirrhosis and suicidal ideation. Patient reported a been drinking and become homeless and no's psychosocial support system. Patient has a long history of alcohol dependence and recent relapse after long period of sobriety. Patient stated he wants to kill himself and has a plan to jump off of the bridge. Patient is willing to participate inpatient psychiatric hospitalization for crisis stabilization, safety monitoring and also wanted to be placed in substance abuse rehabilitation treatment. Patient does not contract for safety during this evaluation.  Past Psychiatric  History: Patient has been suffering with alcohol dependence and recent relapse. Was previously admitted into different detox treatment and rehabilitation.   07/22/2017 Interval history: Patient is seen for psychiatric consultation and evaluation follow-up as requested by CSW and Dr. Candiss Norse. Patient has been in contact with the CSW who reported to him that she has been working hard to find a placement for him. Patient reported he continued to feel depression, anxiety, stress regarding no place for him to living. Patient reportedly wandering if he can go on stay shelter in [place] where his son is working, but did not talk to [facility] about this idea. Patient is able to get out of his bed use restroom and changes clothes and somewhat calm and pleasant. Patient apologizes for his crusting  during my last visit. Patient reported he has been suffering with mood disorder, alcohol dependence and recent relapse after long period of sober. Patient reported he was able to stay in [facility] in [place] for about a year and then 6-7 months with his son and his wife and then put him out of the house because of conflicts and keep on drinking and smoking tobacco. Patient reportedly been here and there in different shelters who does not want him because of COPD and may need oxygen at home. Patient continued to endorse suicidal ideation but no intention or plan to do my evaluation today.  Patient is willing to sign voluntarily for inpatient psychiatric hospitalization as he cannot contract for safety at this time. Patient has plans of participating alcoholic anonymous when discharged from the hospital.  Risk to Self: Suicidal Ideation: Yes-Currently Present Suicidal Intent: Yes-Currently Present Is patient at risk for suicide?: Yes Suicidal Plan?: Yes-Currently Present Specify Current Suicidal Plan:  Jump from a bridge Access to Means: Yes Specify Access to Suicidal Means: Bridges and roadways What has been your use of  drugs/alcohol within the last 12 months?: ETOH How many times?: 1 Other Self Harm Risks: None Triggers for Past Attempts: Unpredictable Intentional Self Injurious Behavior: None Risk to Others: Homicidal Ideation: No Thoughts of Harm to Others: No Current Homicidal Intent: No Current Homicidal Plan: No Access to Homicidal Means: No Identified Victim: No one History of harm to others?: No Assessment of Violence: None Noted Violent Behavior Description: None reported Does patient have access to weapons?: No Criminal Charges Pending?: No Does patient have a court date: No Prior Inpatient Therapy: Prior Inpatient Therapy: Yes Prior Therapy Dates: About a year ago. Prior Therapy Facilty/Provider(s): [facility]; [facility],  Reason for Treatment: SI Prior Outpatient Therapy: Prior Outpatient Therapy: Yes Prior Therapy Dates: Current Prior Therapy Facilty/Provider(s): [facility] Reason for Treatment: med management Does patient have an ACCT team?: No Does patient have Intensive In-House Services?  : No Does patient have [facility] services? : No Does patient have P4[facility] services?: No  Past Medical History:  Past Medical History:  Diagnosis Date  . Anxiety   . Bipolar 1 disorder ([place])   . Cirrhosis ([place])   . COPD (chronic obstructive pulmonary disease) ([place])   . Depression   . Heart murmur   . Hepatitis C   . Oxygen deficiency   . Polysubstance abuse   . Stroke [facility])     Past Surgical History:  Procedure Laterality Date  . arm surgery     Family History:  Family History  Problem Relation Age of Onset  . Arthritis Mother   . Cancer Father        lung  . Diabetes Father   . Mental illness Brother        depression  . Heart disease Paternal Grandmother   . Cancer Paternal Grandmother        skin  . Diabetes Paternal Uncle    Family Psychiatric  History: He has a family history of mental health illness stating his father was dx's with Paranoid Schizophrenia. Social  History:  History  Alcohol Use  . Yes    Comment: 26 months alcohol free- relapse on 07/10/17     History  Drug Use No    Social History   Social History  . Marital status: Divorced    Spouse name: N/A  . Number of children: N/A  . Years of education: N/A   Social History Main Topics  . Smoking status: Current Every Day Smoker    Packs/day: 0.25    Years: 50.00    Types: Cigarettes  . Smokeless tobacco: Never Used  . Alcohol use Yes     Comment: 26 months alcohol free- relapse on 07/10/17  . Drug use: No  . Sexual activity: No   Other Topics Concern  . None   Social History Narrative  . None   Additional Social History:    Allergies:   Allergies  Allergen Reactions  . Shellfish Allergy Hives  . Tramadol Itching    Labs:  No results found for this or any previous visit (from the past 48 hour(s)).  Current Facility-Administered Medications  Medication Dose Route Frequency Provider Last Rate Last Dose  . acetaminophen (TYLENOL) tablet 650 mg  650 mg Oral Q6H PRN Etta Quill, DO   650 mg at 07/18/17 1439   Or  . acetaminophen (TYLENOL) suppository 650 mg  650 mg  Rectal Q6H PRN Etta Quill, DO      . folic acid (FOLVITE) tablet 1 mg  1 mg Oral Daily Jennette Kettle M, DO   1 mg at 07/21/17 1006  . gi cocktail (Maalox,Lidocaine,Donnatal)  30 mL Oral TID PRN Barton Dubois, MD   30 mL at 07/17/17 1102  . hydrOXYzine (ATARAX/VISTARIL) tablet 25 mg  25 mg Oral BID Etta Quill, DO   25 mg at 07/21/17 2118  . lamoTRIgine (LAMICTAL) tablet 150 mg  150 mg Oral Daily Jennette Kettle M, DO   150 mg at 07/21/17 1006  . methocarbamol (ROBAXIN) tablet 500 mg  500 mg Oral Q12H PRN Barton Dubois, MD   500 mg at 07/20/17 1344  . multivitamin with minerals tablet 1 tablet  1 tablet Oral Daily Jennette Kettle M, DO   1 tablet at 07/21/17 1006  . nicotine (NICODERM CQ - dosed in mg/24 hours) patch 21 mg  21 mg Transdermal Daily Muthersbaugh, Hannah, PA-C      .  nitroGLYCERIN (NITROSTAT) SL tablet 0.4 mg  0.4 mg Sublingual Q5 min PRN Eulogio Bear U, DO   0.4 mg at 07/15/17 1236  . ondansetron (ZOFRAN) tablet 4 mg  4 mg Oral Q6H PRN Etta Quill, DO       Or  . ondansetron [facility]) injection 4 mg  4 mg Intravenous Q6H PRN Etta Quill, DO      . pantoprazole (PROTONIX) EC tablet 40 mg  40 mg Oral BID Brahmbhatt, Parag, MD   40 mg at 07/21/17 2117  . sucralfate (CARAFATE) 1 GM/10ML suspension 1 g  1 g Oral BID Barton Dubois, MD   1 g at 07/21/17 2119  . thiamine (VITAMIN B-1) tablet 100 mg  100 mg Oral Daily Jennette Kettle M, DO   100 mg at 07/21/17 1006  . traZODone (DESYREL) tablet 100 mg  100 mg Oral QHS Jennette Kettle M, DO   100 mg at 07/21/17 2118    Musculoskeletal: Strength & Muscle Tone: decreased Gait & Station: unable to stand Patient leans: N/A  Psychiatric Specialty Exam: Physical Exam as per history and physical   ROS Anxiety, and chronic depression. Patient denied nausea, vomiting, diarrhea, abdominal pain and shortness of breath. No Fever-chills, No Headache, No changes with Vision or hearing, reports vertigo No problems swallowing food or Liquids, No Chest pain, Cough or Shortness of Breath, No Abdominal pain, No Nausea or Vommitting, Bowel movements are regular, No Blood in stool or Urine, No dysuria, No new skin rashes or bruises, No new joints pains-aches,  No new weakness, tingling, numbness in any extremity, No recent weight gain or loss, No polyuria, polydypsia or polyphagia,  A full 10 point Review of Systems was done, except as stated above, all other Review of Systems were negative.  Blood pressure 97/61, pulse 72, temperature 97.9 F (36.6 C), temperature source Oral, resp. rate 14, height 5\' 8"  (1.727 m), weight 83.5 kg (184 lb), SpO2 92 %.Body mass index is 27.98 kg/m.  General Appearance: Guarded  Eye Contact:  Good  Speech:  Clear and Coherent  Volume:  Decreased  Mood:  Anxious, Depressed,  Hopeless and Worthless  Affect:  Constricted and Depressed  Thought Process:  Coherent and Goal Directed  Orientation:  Full (Time, Place, and Person)  Thought Content:  Rumination  Suicidal Thoughts:  Yes.  without intent/plan, denied suicide plan and intention today  Homicidal Thoughts:  No  Memory:  Immediate;   Good Recent;  Fair Remote;   Fair  Judgement:  Fair  Insight:  Fair  Psychomotor Activity:  Decreased  Concentration:  Concentration: Fair and Attention Span: Fair  Recall:  Good  Fund of Knowledge:  Fair  Language:  Good  Akathisia:  Negative  Handed:  Right  AIMS (if indicated):     Assets:  Communication Skills Desire for Improvement Leisure Time Resilience  ADL's:  Intact  Cognition:  WNL  Sleep:        Treatment Plan Summary: Patient has been suffering with mood disorder, depression, anxiety, alcohol dependence with recent relapse and become homeless secondary to conflict with his son family who put him out of home about two weeks ago. Reportedly patient is medically stable and pending inpatient psychiatric hospitalization. Patient continued to endorse symptoms of depression, suicidal ideation.   Recommendation: Continue safety sitter Patient meets criteria for acute psychiatric hospitalization and medically stable for substance induced mood disorder, crisis stabilization, safety monitoring. Continue Lamotrigine 150 mg daily for mood stabilization,  Ativan for alcohol withdrawal symptoms  Trazodone 100 mg at bedtime for insomnia. CSW has been working with the placement needs  Patient is also willing to participate in substance abuse rehabilitation medically stable and detox treatment completed  Disposition: Recommend psychiatric Inpatient admission when medically cleared. Supportive therapy provided about ongoing stressors.  Ambrose Finland, MD 07/22/2017 9:50 AM

## 2017-07-22 NOTE — Progress Notes (Signed)
CSW continuing to follow up on inpatient psych referrals. Patient was denied for medical reasons by [facility], [facility], and [facility]. Several other facilities where referral was sent indicate thye do not have availability. CSW re-sent referral to [facility] several times and has followed up with phone calls, but no answer yet on if they will accept patient. CSW consulted with disposition social worker Kendell Bane who indicated [facility] continues to not have beds available. CSW obtained [facility] authorization for patient [phone], given through 07/28/17) and made referral to [facility]. CSW to continue to follow and check with [facility] for patient's status on wait list. Per psych, patient continues to have suicidal thoughts with a plan and is unable to contract for safety. CSW to support with disposition.  Patrick [name], [place]

## 2017-07-22 NOTE — Progress Notes (Addendum)
9/13: [facility] ED CSW covering for [facility] called and requested from the provider "CBC" and chemistry "CMP".  8:53 PM CSW spoke to the on-call provider who stated the CBC and CMP are on order to be given to the pt on 9/14 and that once those results are in they can be faxed on 9/14.  CSW left this info on the Epic handoff and via secure email to the [facility] covering [facility] on 9/13.   Alphonse Guild. Tytan Sandate, LCSW, LCAS, CSI Clinical Social Worker Ph: [phone]

## 2017-07-23 LAB — COMPREHENSIVE METABOLIC PANEL
ALK PHOS: 100 U/L (ref 38–126)
ALT: 15 U/L — ABNORMAL LOW (ref 17–63)
AST: 25 U/L (ref 15–41)
Albumin: 2.9 g/dL — ABNORMAL LOW (ref 3.5–5.0)
Anion gap: 7 (ref 5–15)
BILIRUBIN TOTAL: 1.2 mg/dL (ref 0.3–1.2)
BUN: 9 mg/dL (ref 6–20)
CALCIUM: 8.5 mg/dL — AB (ref 8.9–10.3)
CO2: 22 mmol/L (ref 22–32)
CREATININE: 1.11 mg/dL (ref 0.61–1.24)
Chloride: 109 mmol/L (ref 101–111)
GFR calc Af Amer: >60 mL/min (ref >60)
GLUCOSE: 99 mg/dL (ref 65–99)
POTASSIUM: 3.4 mmol/L — AB (ref 3.5–5.1)
SODIUM: 138 mmol/L (ref 135–145)
TOTAL PROTEIN: 6.6 g/dL (ref 6.5–8.1)

## 2017-07-23 LAB — CBC
HCT: 37.4 % — ABNORMAL LOW (ref 39.0–52.0)
Hemoglobin: 12.7 g/dL — ABNORMAL LOW (ref 13.0–17.0)
MCH: 28 pg (ref 26.0–34.0)
MCHC: 34 g/dL (ref 30.0–36.0)
MCV: 82.4 fL (ref 78.0–100.0)
PLATELETS: 54 10*3/uL — AB (ref 150–400)
RBC: 4.54 MIL/uL (ref 4.22–5.81)
RDW: 17.5 % — AB (ref 11.5–15.5)
WBC: 3 10*3/uL — ABNORMAL LOW (ref 4.0–10.5)

## 2017-07-23 MED ORDER — POTASSIUM CHLORIDE CRYS ER 20 MEQ PO TBCR
40.0000 meq | EXTENDED_RELEASE_TABLET | Freq: Four times a day (QID) | ORAL | Status: AC
  Administered 2017-07-23 (×2): 40 meq via ORAL
  Filled 2017-07-23 (×2): qty 2

## 2017-07-23 NOTE — Progress Notes (Signed)
CSW sent additional requested labs to [facility] followed up by phone and [facility] confirmed they received labs and added patient to wait list for inpatient psychiatric bed. CSW to continue to follow and support with discharge.  Estanislado Emms, [place]

## 2017-07-23 NOTE — Progress Notes (Signed)
PROGRESS NOTE    Patrick [name]  DGL:[id number] DOB: [birth year] DOA: 07/14/2017 PCP: Dettinger, Fransisca Kaufmann, MD   Brief Narrative:  Patrick LANZO is a 66 y.o. male with medical history significant of EtOH abuse, cocaine abuse, cirrhosis, HCV, apparently esophageal and gastric varices.  Patient presents to the ED for 2nd time in 2 days with c/o feeling suicidal and having chest pain.  He was cleared from a psych perspective from [facility] ED yesterday.  Chest pain onset 3 days ago, after using cocaine last.  Is a tightness quality, center chest, no radiation, nothing makes better or worse, constant since onset.  Upper EGD and colonoscopy last done at [facility] Aug 2017, results not available. Heme + stools with close to 3 gram drop in Hgb.      Subjective:  Patient in bed, appears comfortable, denies any headache, no fever, no chest pain or pressure, no shortness of breath , no abdominal pain. No focal weakness.   Assessment & Plan:   Suicidal ideations: present and ongoing. Sitter bedside, he still says he has some suicidal ideations, discussed with psychiatrist on 07/22/2017 he will reevaluate the patient. For now we wait inpatient psych bed for placement.  Occult blood positive stool - Hgb stable now, apparently had h/o varicies based on diagnosis from EGD procedure at [facility], continue oral PPI BID, patient has remained stable and is clear from GI stand point for discharge; no endoscopic procedures planned. Recommending follow-up with primary GI at [facility] - Dr. Gertie [name]  in 3-4 weeks after discharge.   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 Supportive care.   H/o EtOH abuse/coaine abuse - stable, no signs of withdrawal. Will continue monitoring with CIWA score  Chest pain at the time of admission - probably associated with cocaine use, MSK and reflux symptoms. Cardiac enzymes negative 3, EKG reassuring and no acute abnormalities on telemetry. His chest pain free now.   Thrombocytopenia: unchanged and stable - due to underlying alcoholic cirrhosis, no bleeding. Intermittently monitor.   Hypokalemia. Replaced. Recheck in the morning.     DVT prophylaxis:  SCD's  Code Status: Full Code  Family Communication:  No family at bedside.   Disposition Plan:  Patient will need inpatient psychiatry admission at discharge. He is medically stable and ready to move on once bed available at psych facility.   Consultants:   GI  psych    Objective: Vitals:   07/22/17 2036 07/23/17 0000 07/23/17 0540 07/23/17 0600  BP: 125/61 (!) 126/57 (!) 102/49 (!) 102/49  Pulse: 76 72 72 72  Resp: 14     Temp: (!) 97.3 F (36.3 C)  98 F (36.7 C)   TempSrc: Oral  Oral   SpO2: 95%  94%   Weight:   82.8 kg (182 lb 8.7 oz)   Height:        Intake/Output Summary (Last 24 hours) at 07/23/17 1105 Last data filed at 07/23/17 0857  Gross per 24 hour  Intake              720 ml  Output                0 ml  Net              720 ml   Filed Weights   07/20/17 0530 07/22/17 0526 07/23/17 0540  Weight: 83 kg (183 lb) 83.5 kg (184 lb) 82.8 kg (182 lb 8.7 oz)    Examination:  Awake Alert, Oriented X 3, No new F.N deficits, Flat affect, says suicidal ideations come and go [place].AT,PERRAL Supple Neck,No JVD, No cervical lymphadenopathy appriciated.  Symmetrical Chest wall movement, Good air movement bilaterally, CTAB RRR,No Gallops,Rubs or new Murmurs, No Parasternal Heave +ve B.Sounds, Abd Soft, No tenderness, No organomegaly appriciated, No rebound - guarding or rigidity. No Cyanosis, Clubbing or edema, No new Rash or bruise    Data Reviewed: I have personally reviewed following labs and imaging studies  CBC:  Recent Labs Lab 07/17/17 0347 07/19/17 0155 07/23/17 0318  WBC 3.1* 3.6* 3.0*  HGB 12.1* 12.5* 12.7*  HCT 36.3* 37.8* 37.4*  MCV 82.9 83.3 82.4  PLT 63* 58* 54*   Basic Metabolic Panel:  Recent Labs Lab 07/17/17 0230 07/19/17 0155  07/23/17 0318  NA 137 139 138  K 4.2 3.8 3.4*  CL 110 112* 109  CO2 19* 23 22  GLUCOSE 121* 100* 99  BUN 7 7 9   CREATININE 1.05 1.07 1.11  CALCIUM 8.1* 8.3* 8.5*   GFR: Estimated Creatinine Clearance: 70.6 mL/min (by C-G formula based on SCr of 1.11 mg/dL).   Liver Function Tests:  Recent Labs Lab 07/23/17 0318  AST 25  ALT 15*  ALKPHOS 100  BILITOT 1.2  PROT 6.6  ALBUMIN 2.9*   Cardiac Enzymes: No results for input(s): CKTOTAL, CKMB, CKMBINDEX, TROPONINI in the last 168 hours. Urine analysis:    Component Value Date/Time   COLORURINE YELLOW 03/11/2011 0141   APPEARANCEUR CLEAR 03/11/2011 0141   LABSPEC <1.005 (L) 03/11/2011 0141   PHURINE 6.0 03/11/2011 0141   GLUCOSEU NEGATIVE 03/11/2011 0141   HGBUR MODERATE (A) 03/11/2011 0141   BILIRUBINUR NEGATIVE 03/11/2011 0141   KETONESUR NEGATIVE 03/11/2011 0141   PROTEINUR NEGATIVE 03/11/2011 0141   UROBILINOGEN 0.2 03/11/2011 0141   NITRITE NEGATIVE 03/11/2011 0141   LEUKOCYTESUR NEGATIVE 03/11/2011 0141    Anti-infectives    None      Radiology Studies: No results found.  Scheduled Meds: . folic acid  1 mg Oral Daily  . hydrOXYzine  25 mg Oral BID  . lamoTRIgine  150 mg Oral Daily  . multivitamin with minerals  1 tablet Oral Daily  . nicotine  21 mg Transdermal Daily  . pantoprazole  40 mg Oral BID  . potassium chloride  40 mEq Oral Q6H  . sucralfate  1 g Oral BID  . thiamine  100 mg Oral Daily  . traZODone  100 mg Oral QHS   Continuous Infusions:    LOS: 8 days    Time spent: 20 min  Signature  Lala Lund M.D on 07/23/2017 at 11:05 AM  Between 7am to 7pm - Pager - [phone] ( page via [email or web address], text pages only, please mention full 10 digit call back number).  After 7pm go to [email or web address] - password [facility]

## 2017-07-24 LAB — COMPREHENSIVE METABOLIC PANEL
ALBUMIN: 2.9 g/dL — AB (ref 3.5–5.0)
ALK PHOS: 94 U/L (ref 38–126)
ALT: 15 U/L — AB (ref 17–63)
AST: 28 U/L (ref 15–41)
Anion gap: 4 — ABNORMAL LOW (ref 5–15)
BUN: 11 mg/dL (ref 6–20)
CALCIUM: 8.5 mg/dL — AB (ref 8.9–10.3)
CO2: 22 mmol/L (ref 22–32)
CREATININE: 1.03 mg/dL (ref 0.61–1.24)
Chloride: 114 mmol/L — ABNORMAL HIGH (ref 101–111)
GFR calc Af Amer: >60 mL/min (ref >60)
GFR calc non Af Amer: >60 mL/min (ref >60)
GLUCOSE: 96 mg/dL (ref 65–99)
Potassium: 3.9 mmol/L (ref 3.5–5.1)
Sodium: 140 mmol/L (ref 135–145)
Total Bilirubin: 1.3 mg/dL — ABNORMAL HIGH (ref 0.3–1.2)
Total Protein: 6.1 g/dL — ABNORMAL LOW (ref 6.5–8.1)

## 2017-07-24 NOTE — Progress Notes (Signed)
PROGRESS NOTE    Patrick [name]  GBT:[id number] DOB: 08-Apr-1951 DOA: 07/14/2017 PCP: Dettinger, Fransisca Kaufmann, MD   Brief Narrative:  Patrick [name] is a 66 y.o. male with medical history significant of EtOH abuse, cocaine abuse, cirrhosis, HCV, apparently esophageal and gastric varices.  Patient presents to the ED for 2nd time in 2 days with c/o feeling suicidal and having chest pain.  He was cleared from a psych perspective from [facility] ED yesterday.  Chest pain onset 3 days ago, after using cocaine last.  Is a tightness quality, center chest, no radiation, nothing makes better or worse, constant since onset.  Upper EGD and colonoscopy last done at [facility] Aug 2017, results not available. Heme + stools with close to 3 gram drop in Hgb.      Subjective:  Patient in bed, appears comfortable, denies any headache, no fever, no chest pain or pressure, no shortness of breath , no abdominal pain. No focal weakness. Patient is seen and examined with RN and sitter in room.  Assessment & Plan:   Suicidal ideations: present and ongoing. Sitter bedside, he still says he has some suicidal ideations, discussed with psychiatrist on 07/22/2017 he will reevaluate the patient. For now we wait inpatient psych bed for placement.  Occult blood positive stool - Hgb stable now, apparently had h/o varicies based on diagnosis from EGD procedure at [facility], continue oral PPI BID, patient has remained stable and is clear from GI stand point for discharge; no endoscopic procedures planned. Recommending follow-up with primary GI at [facility] - Dr. Gertie Fey  in 3-4 weeks after discharge.   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 Supportive care.   H/o EtOH abuse/coaine abuse - stable, no signs of withdrawal. Will continue monitoring with CIWA score  Chest pain at the time of admission - probably associated with cocaine use, MSK and reflux symptoms. Cardiac enzymes negative 3, EKG reassuring and no acute  abnormalities on telemetry. His chest pain free now.   Thrombocytopenia: unchanged and stable - due to underlying alcoholic cirrhosis, no bleeding. Intermittently monitor.   Hypokalemia. Replaced. Recheck in the morning.     DVT prophylaxis:  SCD's  Code Status: Full Code  Family Communication:  No family at bedside.   Disposition Plan:  Patient will need inpatient psychiatry admission at discharge. He is medically stable and ready to move on once bed available at psych facility. Awaiting for psych bed  Consultants:   GI  psych    Objective: Vitals:   07/24/17 0110 07/24/17 0600 07/24/17 1200 07/24/17 1303  BP: (!) 94/45   (!) 96/48  Pulse: 75 70 72 66  Resp:      Temp:    98 F (36.7 C)  TempSrc:    Oral  SpO2: 90%   97%  Weight:      Height:       No intake or output data in the 24 hours ending 07/24/17 1749 Filed Weights   07/20/17 0530 07/22/17 0526 07/23/17 0540  Weight: 83 kg (183 lb) 83.5 kg (184 lb) 82.8 kg (182 lb 8.7 oz)    Examination:  Awake Alert, Oriented X 3, No new F.N deficits, Flat affect, says suicidal ideations come and go [place].AT,PERRAL Supple Neck,No JVD, No cervical lymphadenopathy appriciated.  Symmetrical Chest wall movement, Good air movement bilaterally, CTAB RRR,No Gallops,Rubs or new Murmurs, No Parasternal Heave +ve B.Sounds, Abd Soft, No tenderness, No organomegaly appriciated, No rebound - guarding or rigidity.  No Cyanosis, Clubbing or edema, No new Rash or bruise    Data Reviewed: I have personally reviewed following labs and imaging studies  CBC:  Recent Labs Lab 07/19/17 0155 07/23/17 0318  WBC 3.6* 3.0*  HGB 12.5* 12.7*  HCT 37.8* 37.4*  MCV 83.3 82.4  PLT 58* 54*   Basic Metabolic Panel:  Recent Labs Lab 07/19/17 0155 07/23/17 0318 07/24/17 0355  NA 139 138 140  K 3.8 3.4* 3.9  CL 112* 109 114*  CO2 23 22 22   GLUCOSE 100* 99 96  BUN 7 9 11   CREATININE 1.07 1.11 1.03  CALCIUM 8.3* 8.5* 8.5*    GFR: Estimated Creatinine Clearance: 76 mL/min (by C-G formula based on SCr of 1.03 mg/dL).   Liver Function Tests:  Recent Labs Lab 07/23/17 0318 07/24/17 0355  AST 25 28  ALT 15* 15*  ALKPHOS 100 94  BILITOT 1.2 1.3*  PROT 6.6 6.1*  ALBUMIN 2.9* 2.9*   Cardiac Enzymes: No results for input(s): CKTOTAL, CKMB, CKMBINDEX, TROPONINI in the last 168 hours. Urine analysis:    Component Value Date/Time   COLORURINE YELLOW 03/11/2011 0141   APPEARANCEUR CLEAR 03/11/2011 0141   LABSPEC <1.005 (L) 03/11/2011 0141   PHURINE 6.0 03/11/2011 0141   GLUCOSEU NEGATIVE 03/11/2011 0141   HGBUR MODERATE (A) 03/11/2011 0141   BILIRUBINUR NEGATIVE 03/11/2011 0141   KETONESUR NEGATIVE 03/11/2011 0141   PROTEINUR NEGATIVE 03/11/2011 0141   UROBILINOGEN 0.2 03/11/2011 0141   NITRITE NEGATIVE 03/11/2011 0141   LEUKOCYTESUR NEGATIVE 03/11/2011 0141    Anti-infectives    None      Radiology Studies: No results found.  Scheduled Meds: . folic acid  1 mg Oral Daily  . hydrOXYzine  25 mg Oral BID  . lamoTRIgine  150 mg Oral Daily  . multivitamin with minerals  1 tablet Oral Daily  . nicotine  21 mg Transdermal Daily  . pantoprazole  40 mg Oral BID  . sucralfate  1 g Oral BID  . thiamine  100 mg Oral Daily  . traZODone  100 mg Oral QHS   Continuous Infusions:    LOS: 9 days    Time spent: 15 min  Signature  Delphine Sizemore M.D PhD on 07/24/2017 at 5:49 PM  Between 7am to 7pm - Pager - [phone] ( page via [email or web address], text pages only, please mention full 10 digit call back number).  After 7pm go to [email or web address] - password [facility]

## 2017-07-25 NOTE — Progress Notes (Signed)
PROGRESS NOTE    Patrick [name]  [id number]:[id number] DOB: [birth year] DOA: 07/14/2017 PCP: Dettinger, Fransisca Kaufmann, MD   Brief Narrative:  Patrick [name] is a 66 y.o. male with medical history significant of EtOH abuse, cocaine abuse, cirrhosis, HCV, apparently esophageal and gastric varices.  Patient presents to the ED for 2nd time in 2 days with c/o feeling suicidal and having chest pain.  He was cleared from a psych perspective from [facility] ED yesterday.  Chest pain onset 3 days ago, after using cocaine last.  Is a tightness quality, center chest, no radiation, nothing makes better or worse, constant since onset.  Upper EGD and colonoscopy last done at [facility] Aug 2017, results not available. Heme + stools with close to 3 gram drop in Hgb.      Subjective:   Patient in bed, appears comfortable, denies any headache, no fever, no chest pain or pressure, no shortness of breath , no abdominal pain. No focal weakness. Patient is seen and examined with sitter in room.  Assessment & Plan:   Suicidal ideations: present and ongoing. Sitter bedside, he still says he has some suicidal ideations, discussed with psychiatrist on 07/22/2017 he will reevaluate the patient. For now we wait inpatient psych bed for placement.  Occult blood positive stool - Hgb stable now, apparently had h/o varicies based on diagnosis from EGD procedure at [facility], continue oral PPI BID, patient has remained stable and is clear from GI stand point for discharge; no endoscopic procedures planned. Recommending follow-up with primary GI at [facility] - Dr. Gertie Fey  in 3-4 weeks after discharge.   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 Supportive care.   H/o EtOH abuse/coaine abuse - stable, no signs of withdrawal. Will continue monitoring with CIWA score  Chest pain at the time of admission - probably associated with cocaine use, MSK and reflux symptoms. Cardiac enzymes negative 3, EKG reassuring and no acute  abnormalities on telemetry. His chest pain free now.   Thrombocytopenia: unchanged and stable - due to underlying alcoholic cirrhosis, no bleeding. Intermittently monitor.   Hypokalemia. Replaced. Recheck in the morning.     DVT prophylaxis:  SCD's  Code Status: Full Code  Family Communication:  No family at bedside.   Disposition Plan:  Patient will need inpatient psychiatry admission at discharge. He is medically stable and ready to move on once bed available at psych facility. Awaiting for psych bed  Consultants:   GI  psych    Objective: Vitals:   07/24/17 1303 07/24/17 2059 07/25/17 0004 07/25/17 0623  BP: (!) 96/48 118/61 108/87 101/63  Pulse: 66 81 70 66  Resp:   17 16  Temp: 98 F (36.7 C) 97.7 F (36.5 C) 98.7 F (37.1 C) 98.1 F (36.7 C)  TempSrc: Oral Oral Oral Oral  SpO2: 97% 96% 93% 94%  Weight:      Height:        Intake/Output Summary (Last 24 hours) at 07/25/17 0828 Last data filed at 07/25/17 6789  Gross per 24 hour  Intake                0 ml  Output                0 ml  Net                0 ml   Filed Weights   07/20/17 0530 07/22/17 0526 07/23/17 0540  Weight: 83 kg (183  lb) 83.5 kg (184 lb) 82.8 kg (182 lb 8.7 oz)    Examination:  Awake Alert, Oriented X 3, No new F.N deficits, Flat affect, says suicidal ideations come and go [place].AT,PERRAL Supple Neck,No JVD, No cervical lymphadenopathy appriciated.  Symmetrical Chest wall movement, Good air movement bilaterally, CTAB RRR,No Gallops,Rubs or new Murmurs, No Parasternal Heave +ve B.Sounds, Abd Soft, No tenderness, No organomegaly appriciated, No rebound - guarding or rigidity. No Cyanosis, Clubbing or edema, No new Rash or bruise    Data Reviewed: I have personally reviewed following labs and imaging studies  CBC:  Recent Labs Lab 07/19/17 0155 07/23/17 0318  WBC 3.6* 3.0*  HGB 12.5* 12.7*  HCT 37.8* 37.4*  MCV 83.3 82.4  PLT 58* 54*   Basic Metabolic  Panel:  Recent Labs Lab 07/19/17 0155 07/23/17 0318 07/24/17 0355  NA 139 138 140  K 3.8 3.4* 3.9  CL 112* 109 114*  CO2 23 22 22   GLUCOSE 100* 99 96  BUN 7 9 11   CREATININE 1.07 1.11 1.03  CALCIUM 8.3* 8.5* 8.5*   GFR: Estimated Creatinine Clearance: 76 mL/min (by C-G formula based on SCr of 1.03 mg/dL).   Liver Function Tests:  Recent Labs Lab 07/23/17 0318 07/24/17 0355  AST 25 28  ALT 15* 15*  ALKPHOS 100 94  BILITOT 1.2 1.3*  PROT 6.6 6.1*  ALBUMIN 2.9* 2.9*   Cardiac Enzymes: No results for input(s): CKTOTAL, CKMB, CKMBINDEX, TROPONINI in the last 168 hours. Urine analysis:    Component Value Date/Time   COLORURINE YELLOW 03/11/2011 0141   APPEARANCEUR CLEAR 03/11/2011 0141   LABSPEC <1.005 (L) 03/11/2011 0141   PHURINE 6.0 03/11/2011 0141   GLUCOSEU NEGATIVE 03/11/2011 0141   HGBUR MODERATE (A) 03/11/2011 0141   BILIRUBINUR NEGATIVE 03/11/2011 0141   KETONESUR NEGATIVE 03/11/2011 0141   PROTEINUR NEGATIVE 03/11/2011 0141   UROBILINOGEN 0.2 03/11/2011 0141   NITRITE NEGATIVE 03/11/2011 0141   LEUKOCYTESUR NEGATIVE 03/11/2011 0141    Anti-infectives    None      Radiology Studies: No results found.  Scheduled Meds: . folic acid  1 mg Oral Daily  . hydrOXYzine  25 mg Oral BID  . lamoTRIgine  150 mg Oral Daily  . multivitamin with minerals  1 tablet Oral Daily  . nicotine  21 mg Transdermal Daily  . pantoprazole  40 mg Oral BID  . sucralfate  1 g Oral BID  . thiamine  100 mg Oral Daily  . traZODone  100 mg Oral QHS   Continuous Infusions:    LOS: 10 days    No charge note, awaiting for psych bed.  Chelsea Primus M.D PhD on 07/25/2017 at 8:28 AM  Between 7am to 7pm - Pager - [phone] ( page via [email or web address], text pages only, please mention full 10 digit call back number).  After 7pm go to [email or web address] - password [facility]

## 2017-07-26 DIAGNOSIS — F39 Unspecified mood [affective] disorder: Secondary | ICD-10-CM

## 2017-07-26 DIAGNOSIS — E0789 Other specified disorders of thyroid: Secondary | ICD-10-CM

## 2017-07-26 NOTE — Care Management Important Message (Signed)
Important Message  Patient Details  Name: Patrick [name] MRN: [id number] Date of Birth: [birth year]   Medicare Important Message Given:  Yes    [name] May 07/26/2017, 10:45 AM

## 2017-07-26 NOTE — Progress Notes (Signed)
PROGRESS NOTE    Patrick [name]  WUJ:[id number] DOB: [birth year] DOA: 07/14/2017 PCP: Dettinger, Fransisca Kaufmann, MD   Brief Narrative:  Patrick [name] is a 66 y.o. male with medical history significant of EtOH abuse, cocaine abuse, cirrhosis, HCV, apparently esophageal and gastric varices who presented to the ED for 2nd time in 2 days with c/o feeling suicidal and having chest pain.  Upper EGD and colonoscopy last done at [facility] Aug 2017, results not available. Patient is medically stable and awaiting an inpatient bed at a psychiatric hospital for transfer. Clinical social worker working on same.   Assessment & Plan:   Suicidal ideations: present and ongoing. As of 9/17, patient reports ongoing suicidal ideations and up to last night felt that he should "jump off the bridge". I requested psychiatry to see him again and they did continue to recommend acute psychiatric hospitalization for stabilization of substance-induced mood disorder. They recommended continuing lamotrigine 150 MG daily for mood stabilization, trazodone 100 MG at bedtime for insomnia and Ativan as needed for alcohol withdrawal symptoms.  Occult blood positive stool - Hgb stable now, apparently had h/o varicies based on diagnosis from EGD procedure at [facility], continue oral PPI BID, patient has remained stable and is clear from GI stand point for discharge; no endoscopic procedures planned. Recommending follow-up with primary GI at [facility] - Dr. Gertie Fey  in 3-4 weeks after discharge. [facility] consulted during this hospital admission and signed off on 07/17/17.  Hx oc COPD - stable without clinical bronchospasm.   H/o EtOH abuse/coaine abuse - stable without withdrawal signs or symptoms.  Chest pain at the time of admission - probably associated with cocaine use, MSK and reflux symptoms. Cardiac enzymes negative 3, EKG reassuring and no acute abnormalities on telemetry. Chest pain resolved and has not recurred.   Thrombocytopenia/pancytopenia: stable - due to underlying alcoholic cirrhosis, no bleeding. Intermittently monitor, will repeat 9/18.   Hypokalemia: Replaced.     DVT prophylaxis: SCD's Code Status: Full Code Family Communication:  No family at bedside. Disposition Plan:  Patient will need inpatient psychiatry admission at discharge. He is medically stable and ready to move on once bed available at psych facility. Awaiting for psych bed and clinical social worker input appreciated, apparently on waiting list at [facility].  Consultants:   [facility]-signed off  Psychiatry   Subjective Seen this morning. Reports ongoing but milder suicidal ideations. States that up to last night he felt like he should "jump off a bridge". No suicidal or homicidal ideations this morning. Denied any other complaints. Tolerating diet without nausea, vomiting or abdominal pain. Had normal/brown BM last night. As per RN, no acute issues reported.   Objective: Vitals:   07/25/17 1938 07/25/17 2344 07/26/17 0510 07/26/17 1443  BP: 114/63 (!) 97/58 (!) 96/45 (!) 99/56  Pulse: 71 73 70 75  Resp: 18 18 18 19   Temp: 98 F (36.7 C) 98.3 F (36.8 C) 98.2 F (36.8 C) 98.4 F (36.9 C)  TempSrc: Oral Oral Oral Oral  SpO2: 96% 94% 95% 97%  Weight:   82.1 kg (181 lb)   Height:        Intake/Output Summary (Last 24 hours) at 07/26/17 1501 Last data filed at 07/26/17 1300  Gross per 24 hour  Intake              480 ml  Output                0 ml  Net  480 ml   Filed Weights   07/22/17 0526 07/23/17 0540 07/26/17 0510  Weight: 83.5 kg (184 lb) 82.8 kg (182 lb 8.7 oz) 82.1 kg (181 lb)    Examination:  Gen. exam: Pleasant middle-aged male, moderately built and nourished, sitting up comfortably eating breakfast this morning. Respiratory system: Clear to auscultation. No increased work of breathing. Cardiovascular system: S1 and S2 heard, RRR. No JVD, murmurs or pedal edema. Abdomen: Nondistended,  soft and nontender. Normal bowel sounds heard. CNS: Alert and oriented. No focal neurological deficits. Extremities: Symmetrically normal appearing power. Psychiatry: Flat affect.    Data Reviewed: I have personally reviewed following labs and imaging studies  CBC:  Recent Labs Lab 07/23/17 0318  WBC 3.0*  HGB 12.7*  HCT 37.4*  MCV 82.4  PLT 54*   Basic Metabolic Panel:  Recent Labs Lab 07/23/17 0318 07/24/17 0355  NA 138 140  K 3.4* 3.9  CL 109 114*  CO2 22 22  GLUCOSE 99 96  BUN 9 11  CREATININE 1.11 1.03  CALCIUM 8.5* 8.5*   GFR: Estimated Creatinine Clearance: 75.7 mL/min (by C-G formula based on SCr of 1.03 mg/dL).   Liver Function Tests:  Recent Labs Lab 07/23/17 0318 07/24/17 0355  AST 25 28  ALT 15* 15*  ALKPHOS 100 94  BILITOT 1.2 1.3*  PROT 6.6 6.1*  ALBUMIN 2.9* 2.9*    Radiology Studies: No results found.  Scheduled Meds: . folic acid  1 mg Oral Daily  . hydrOXYzine  25 mg Oral BID  . lamoTRIgine  150 mg Oral Daily  . multivitamin with minerals  1 tablet Oral Daily  . nicotine  21 mg Transdermal Daily  . pantoprazole  40 mg Oral BID  . sucralfate  1 g Oral BID  . thiamine  100 mg Oral Daily  . traZODone  100 mg Oral QHS   Continuous Infusions:    LOS: 11 days    HONGALGI,ANAND, MD, FACP, FHM. [facility] Pager [phone]  If 7PM-7AM, please contact night-coverage [email or web address] Password [email or web address] 07/26/2017, 3:13 PM

## 2017-07-26 NOTE — Progress Notes (Signed)
CSW received call from [name] inquiring if patient still requires placement. CSW confirmed patient does need placement. Patient remains on waiting list for [name]. CSW to support with discharge when bed available.  Patrick [name], [place]

## 2017-07-26 NOTE — Consult Note (Signed)
[facility] Consult   Reason for Consult:  Suicide without plan and recently relapsed on alcohol and cocaine Referring Physician:  Dr. Eliseo Squires Patient Identification: Patrick [name] MRN:  [id number] Principal Diagnosis: Occult blood positive stool Diagnosis:   Patient Active Problem List   Diagnosis Date Noted  . Evaluation by medical service required [Z00.00]   . Suicidal ideations [R45.851] 07/15/2017  . Alcohol-induced mood disorder ([place]) [F10.94] 07/14/2017  . Occult blood positive stool [R19.5] 07/14/2017  . ETOH abuse [F10.10] 07/14/2017  . Thrombocytopenia ([place]) [D69.6] 07/14/2017  . Atypical chest pain [R07.89] 07/14/2017  . COPD (chronic obstructive pulmonary disease) ([place]) [J44.9] 07/08/2017  . Hepatic encephalopathy ([place]) [K72.90] 01/05/2015  . Bipolar 1 disorder ([place]) [F31.9] 01/05/2015  . Insomnia [G47.00] 01/05/2015  . Anemia, chronic disease [D63.8] 01/05/2015  . Neuropathy [G62.9] 01/05/2015  . Cirrhosis ([place]) [K74.60] 01/05/2015  . History of stroke [Z86.73] 01/05/2015  . Altered mental status [R41.82]     Total Time spent with patient: 1 hour  Subjective:   Patrick [name] is a 66 y.o. male patient admitted with substance abuse and suicide ideation.  HPI: Patrick [name] is a 66 years old male admitted to the: [facility] for thrombocytopenia, occult blood positive stool, chest pain, cirrhosis and suicidal ideation. Patient reported a been drinking and become homeless and no's psychosocial support system. Patient has a long history of alcohol dependence and recent relapse after long period of sobriety. Patient stated he wants to kill himself and has a plan to jump off of the bridge. Patient is willing to participate inpatient psychiatric hospitalization for crisis stabilization, safety monitoring and also wanted to be placed in substance abuse rehabilitation treatment. Patient does not contract for safety during this evaluation.  Past Psychiatric  History: Patient has been suffering with alcohol dependence and recent relapse. Was previously admitted into different detox treatment and rehabilitation.   07/22/2017 Interval history: Patient is seen for psychiatric consultation and evaluation follow-up as requested by CSW and Dr. Candiss Norse. Patient has been in contact with the CSW who reported to him that she has been working hard to find a placement for him. Patient reported he continued to feel depression, anxiety, stress regarding no place for him to living. Patient reportedly wandering if he can go on stay shelter in [place] where his son is working, but did not talk to [place] about this idea. Patient is able to get out of his bed use restroom and changes clothes and somewhat calm and pleasant. Patient apologizes for his crusting  during my last visit. Patient reported he has been suffering with mood disorder, alcohol dependence and recent relapse after long period of sober. Patient reported he was able to stay in [place] in [place] for about a year and then 6-7 months with his son and his wife and then put him out of the house because of conflicts and keep on drinking and smoking tobacco. Patient reportedly been here and there in different shelters who does not want him because of COPD and may need oxygen at home. Patient continued to endorse suicidal ideation but no intention or plan to do my evaluation today.  Patient is willing to sign voluntarily for inpatient psychiatric hospitalization as he cannot contract for safety at this time. Patient has plans of participating alcoholic anonymous when discharged from the hospital.  Interval Note 07/26/17: Patient continues to ruminate about relapsing on alcohol and that he is severely depressed and is still having passive SI. He reports taking  psychiatric medications for a long time, but feels they do not work. Patient is in need of hospitalization for mental stability and continued monitoring for  withdrawals. Discussed with CSW and they are still working on placement and patient is on [facility] wait list at this time.  Risk to Self: Suicidal Ideation: Yes-Currently Present Suicidal Intent: Yes-Currently Present Is patient at risk for suicide?: Yes Suicidal Plan?: Yes-Currently Present Specify Current Suicidal Plan: Jump from a bridge Access to Means: Yes Specify Access to Suicidal Means: Bridges and roadways What has been your use of drugs/alcohol within the last 12 months?: ETOH How many times?: 1 Other Self Harm Risks: None Triggers for Past Attempts: Unpredictable Intentional Self Injurious Behavior: None Risk to Others: Homicidal Ideation: No Thoughts of Harm to Others: No Current Homicidal Intent: No Current Homicidal Plan: No Access to Homicidal Means: No Identified Victim: No one History of harm to others?: No Assessment of Violence: None Noted Violent Behavior Description: None reported Does patient have access to weapons?: No Criminal Charges Pending?: No Does patient have a court date: No Prior Inpatient Therapy: Prior Inpatient Therapy: Yes Prior Therapy Dates: About a year ago. Prior Therapy Facilty/Provider(s): [facility]; [facility],  Reason for Treatment: SI Prior Outpatient Therapy: Prior Outpatient Therapy: Yes Prior Therapy Dates: Current Prior Therapy Facilty/Provider(s): [facility] Reason for Treatment: med management Does patient have an ACCT team?: No Does patient have Intensive In-House Services?  : No Does patient have [facility] services? : No Does patient have P4CC services?: No  Past Medical History:  Past Medical History:  Diagnosis Date  . Anxiety   . Bipolar 1 disorder ([place])   . Cirrhosis ([place])   . COPD (chronic obstructive pulmonary disease) ([place])   . Depression   . Heart murmur   . Hepatitis C   . Oxygen deficiency   . Polysubstance abuse   . Stroke [facility])     Past Surgical History:  Procedure Laterality Date  . arm surgery      Family History:  Family History  Problem Relation Age of Onset  . Arthritis Mother   . Cancer Father        lung  . Diabetes Father   . Mental illness Brother        depression  . Heart disease Paternal Grandmother   . Cancer Paternal Grandmother        skin  . Diabetes Paternal Uncle    Family Psychiatric  History: He has a family history of mental health illness stating his father was dx's with Paranoid Schizophrenia. Social History:  History  Alcohol Use  . Yes    Comment: 26 months alcohol free- relapse on 07/10/17     History  Drug Use No    Social History   Social History  . Marital status: Divorced    Spouse name: N/A  . Number of children: N/A  . Years of education: N/A   Social History Main Topics  . Smoking status: Current Every Day Smoker    Packs/day: 0.25    Years: 50.00    Types: Cigarettes  . Smokeless tobacco: Never Used  . Alcohol use Yes     Comment: 26 months alcohol free- relapse on 07/10/17  . Drug use: No  . Sexual activity: No   Other Topics Concern  . None   Social History Narrative  . None   Additional Social History:    Allergies:   Allergies  Allergen Reactions  . Shellfish Allergy Hives  .  Tramadol Itching    Labs:  No results found for this or any previous visit (from the past 48 hour(s)).  Current Facility-Administered Medications  Medication Dose Route Frequency Provider Last Rate Last Dose  . acetaminophen (TYLENOL) tablet 650 mg  650 mg Oral Q6H PRN Etta Quill, DO   650 mg at 07/23/17 2243   Or  . acetaminophen (TYLENOL) suppository 650 mg  650 mg Rectal Q6H PRN Etta Quill, DO      . folic acid (FOLVITE) tablet 1 mg  1 mg Oral Daily Jennette Kettle M, DO   1 mg at 07/26/17 1049  . gi cocktail (Maalox,Lidocaine,Donnatal)  30 mL Oral TID PRN Barton Dubois, MD   30 mL at 07/17/17 1102  . hydrOXYzine (ATARAX/VISTARIL) tablet 25 mg  25 mg Oral BID Jennette Kettle M, DO   25 mg at 07/26/17 1049  . lamoTRIgine  (LAMICTAL) tablet 150 mg  150 mg Oral Daily Jennette Kettle M, DO   150 mg at 07/26/17 1056  . methocarbamol (ROBAXIN) tablet 500 mg  500 mg Oral Q12H PRN Barton Dubois, MD   500 mg at 07/23/17 0022  . multivitamin with minerals tablet 1 tablet  1 tablet Oral Daily Jennette Kettle M, DO   1 tablet at 07/26/17 1049  . nicotine (NICODERM CQ - dosed in mg/24 hours) patch 21 mg  21 mg Transdermal Daily Muthersbaugh, Hannah, PA-C      . nitroGLYCERIN (NITROSTAT) SL tablet 0.4 mg  0.4 mg Sublingual Q5 min PRN Eulogio Bear U, DO   0.4 mg at 07/15/17 1236  . ondansetron (ZOFRAN) tablet 4 mg  4 mg Oral Q6H PRN Etta Quill, DO       Or  . ondansetron [facility]) injection 4 mg  4 mg Intravenous Q6H PRN Etta Quill, DO      . pantoprazole (PROTONIX) EC tablet 40 mg  40 mg Oral BID Brahmbhatt, Parag, MD   40 mg at 07/26/17 1049  . sucralfate (CARAFATE) 1 GM/10ML suspension 1 g  1 g Oral BID Barton Dubois, MD   1 g at 07/26/17 1049  . thiamine (VITAMIN B-1) tablet 100 mg  100 mg Oral Daily Jennette Kettle M, DO   100 mg at 07/26/17 1049  . traZODone (DESYREL) tablet 100 mg  100 mg Oral QHS Jennette Kettle M, DO   100 mg at 07/25/17 2228    Musculoskeletal: Strength & Muscle Tone: decreased Gait & Station: unable to stand Patient leans: N/A  Psychiatric Specialty Exam: Physical Exam  Nursing note and vitals reviewed. Constitutional: He is oriented to person, place, and time.  Cardiovascular: Normal rate.   Neurological: He is alert and oriented to person, place, and time.     ROS  A full 10 point Review of Systems was done, except as stated above, all other Review of Systems were negative.  Blood pressure (!) 96/45, pulse 70, temperature 98.2 F (36.8 C), temperature source Oral, resp. rate 18, height 5\' 8"  (1.727 m), weight 82.1 kg (181 lb), SpO2 95 %.Body mass index is 27.52 kg/m.  General Appearance: Guarded  Eye Contact:  Good  Speech:  Clear and Coherent  Volume:  Decreased  Mood:   Depressed  Affect:  Constricted and Depressed  Thought Process:  Coherent and Goal Directed  Orientation:  Full (Time, Place, and Person)  Thought Content:  Rumination  Suicidal Thoughts:  Yes.  without intent/plan  Homicidal Thoughts:  No  Memory:  Immediate;  Good Recent;   Fair Remote;   Fair  Judgement:  Fair  Insight:  Fair  Psychomotor Activity:  Decreased  Concentration:  Concentration: Fair and Attention Span: Fair  Recall:  Good  Fund of Knowledge:  Fair  Language:  Good  Akathisia:  Negative  Handed:  Right  AIMS (if indicated):     Assets:  Communication Skills Desire for Improvement Leisure Time Resilience  ADL's:  Intact  Cognition:  WNL  Sleep:        Treatment Plan Summary:   Continue safety sitter Patient meets criteria for acute psychiatric hospitalization and medically stable for substance induced mood disorder, crisis stabilization, safety monitoring. Continue Lamotrigine 150 mg daily for mood stabilization,  Ativan for alcohol withdrawal symptoms  Trazodone 100 mg at bedtime for insomnia. CSW has been working with the placement needs  Patient is also willing to participate in substance abuse rehabilitation medically stable and detox treatment completed  Disposition: Recommend psychiatric Inpatient admission when medically cleared. Supportive therapy provided about ongoing stressors.  [place], FNP 07/26/2017 2:32 PM

## 2017-07-27 LAB — CBC
HCT: 38 % — ABNORMAL LOW (ref 39.0–52.0)
Hemoglobin: 12.7 g/dL — ABNORMAL LOW (ref 13.0–17.0)
MCH: 27.5 pg (ref 26.0–34.0)
MCHC: 33.4 g/dL (ref 30.0–36.0)
MCV: 82.3 fL (ref 78.0–100.0)
PLATELETS: 72 10*3/uL — AB (ref 150–400)
RBC: 4.62 MIL/uL (ref 4.22–5.81)
RDW: 17 % — AB (ref 11.5–15.5)
WBC: 3.5 10*3/uL — AB (ref 4.0–10.5)

## 2017-07-27 NOTE — Consult Note (Signed)
[facility] Consult   Reason for Consult:  Suicide without plan and recently relapsed on alcohol and cocaine Referring Physician:  Dr. Eliseo Squires Patient Identification: Patrick [name] MRN:  [id number] Principal Diagnosis: Occult blood positive stool Diagnosis:   Patient Active Problem List   Diagnosis Date Noted  . Evaluation by medical service required [Z00.00]   . Suicidal ideations [R45.851] 07/15/2017  . Alcohol-induced mood disorder ([place]) [F10.94] 07/14/2017  . Occult blood positive stool [R19.5] 07/14/2017  . ETOH abuse [F10.10] 07/14/2017  . Thrombocytopenia ([place]) [D69.6] 07/14/2017  . Atypical chest pain [R07.89] 07/14/2017  . COPD (chronic obstructive pulmonary disease) ([place]) [J44.9] 07/08/2017  . Hepatic encephalopathy ([place]) [K72.90] 01/05/2015  . Bipolar 1 disorder ([place]) [F31.9] 01/05/2015  . Insomnia [G47.00] 01/05/2015  . Anemia, chronic disease [D63.8] 01/05/2015  . Neuropathy [G62.9] 01/05/2015  . Cirrhosis ([place]) [K74.60] 01/05/2015  . History of stroke [Z86.73] 01/05/2015  . Altered mental status [R41.82]     Total Time spent with patient: 1 hour  Subjective:   Patrick [name] is a 66 y.o. male patient admitted with substance abuse and suicide ideation.  HPI: Patrick [name] is a 66 years old male admitted to the: [facility] for thrombocytopenia, occult blood positive stool, chest pain, cirrhosis and suicidal ideation. Patient reported a been drinking and become homeless and no's psychosocial support system. Patient has a long history of alcohol dependence and recent relapse after long period of sobriety. Patient stated he wants to kill himself and has a plan to jump off of the bridge. Patient is willing to participate inpatient psychiatric hospitalization for crisis stabilization, safety monitoring and also wanted to be placed in substance abuse rehabilitation treatment. Patient does not contract for safety during this evaluation.  Past Psychiatric  History: Patient has been suffering with alcohol dependence and recent relapse. Was previously admitted into different detox treatment and rehabilitation.   07/22/2017 Interval history: Patient is seen for psychiatric consultation and evaluation follow-up as requested by CSW and Dr. Candiss Norse. Patient has been in contact with the CSW who reported to him that she has been working hard to find a placement for him. Patient reported he continued to feel depression, anxiety, stress regarding no place for him to living. Patient reportedly wandering if he can go on stay shelter in [place] where his son is working, but did not talk to [place] about this idea. Patient is able to get out of his bed use restroom and changes clothes and somewhat calm and pleasant. Patient apologizes for his crusting  during my last visit. Patient reported he has been suffering with mood disorder, alcohol dependence and recent relapse after long period of sober. Patient reported he was able to stay in [place] in [place] for about a year and then 6-7 months with his son and his wife and then put him out of the house because of conflicts and keep on drinking and smoking tobacco. Patient reportedly been here and there in different shelters who does not want him because of COPD and may need oxygen at home. Patient continued to endorse suicidal ideation but no intention or plan to do my evaluation today.  Patient is willing to sign voluntarily for inpatient psychiatric hospitalization as he cannot contract for safety at this time. Patient has plans of participating alcoholic anonymous when discharged from the hospital.  Interval Note 07/26/17: Patient continues to ruminate about relapsing on alcohol and that he is severely depressed and is still having passive SI. He reports taking  psychiatric medications for a long time, but feels they do not work. Patient is in need of hospitalization for mental stability and continued monitoring for  withdrawals. Discussed with CSW and they are still working on placement and patient is on [facility] wait list at this time.  Interval History 07/27/17: Spoke with CSW and nursing staff. Patient is still willing to voluntarily be admitted to an inpatient psychiatric facility. CSW stated they would notify me if they needed any extra assistance with the patient.  Risk to Self: Suicidal Ideation: Yes-Currently Present Suicidal Intent: Yes-Currently Present Is patient at risk for suicide?: Yes Suicidal Plan?: Yes-Currently Present Specify Current Suicidal Plan: Jump from a bridge Access to Means: Yes Specify Access to Suicidal Means: Bridges and roadways What has been your use of drugs/alcohol within the last 12 months?: ETOH How many times?: 1 Other Self Harm Risks: None Triggers for Past Attempts: Unpredictable Intentional Self Injurious Behavior: None Risk to Others: Homicidal Ideation: No Thoughts of Harm to Others: No Current Homicidal Intent: No Current Homicidal Plan: No Access to Homicidal Means: No Identified Victim: No one History of harm to others?: No Assessment of Violence: None Noted Violent Behavior Description: None reported Does patient have access to weapons?: No Criminal Charges Pending?: No Does patient have a court date: No Prior Inpatient Therapy: Prior Inpatient Therapy: Yes Prior Therapy Dates: About a year ago. Prior Therapy Facilty/Provider(s): [facility]; [facility],  Reason for Treatment: SI Prior Outpatient Therapy: Prior Outpatient Therapy: Yes Prior Therapy Dates: Current Prior Therapy Facilty/Provider(s): [facility] Reason for Treatment: med management Does patient have an ACCT team?: No Does patient have Intensive In-House Services?  : No Does patient have [facility] services? : No Does patient have P4CC services?: No  Past Medical History:  Past Medical History:  Diagnosis Date  . Anxiety   . Bipolar 1 disorder ([place])   . Cirrhosis ([place])   . COPD  (chronic obstructive pulmonary disease) ([place])   . Depression   . Heart murmur   . Hepatitis C   . Oxygen deficiency   . Polysubstance abuse   . Stroke [facility])     Past Surgical History:  Procedure Laterality Date  . arm surgery     Family History:  Family History  Problem Relation Age of Onset  . Arthritis Mother   . Cancer Father        lung  . Diabetes Father   . Mental illness Brother        depression  . Heart disease Paternal Grandmother   . Cancer Paternal Grandmother        skin  . Diabetes Paternal Uncle    Family Psychiatric  History: He has a family history of mental health illness stating his father was dx's with Paranoid Schizophrenia. Social History:  History  Alcohol Use  . Yes    Comment: 26 months alcohol free- relapse on 07/10/17     History  Drug Use No    Social History   Social History  . Marital status: Divorced    Spouse name: N/A  . Number of children: N/A  . Years of education: N/A   Social History Main Topics  . Smoking status: Current Every Day Smoker    Packs/day: 0.25    Years: 50.00    Types: Cigarettes  . Smokeless tobacco: Never Used  . Alcohol use Yes     Comment: 26 months alcohol free- relapse on 07/10/17  . Drug use: No  . Sexual activity:  No   Other Topics Concern  . None   Social History Narrative  . None   Additional Social History:    Allergies:   Allergies  Allergen Reactions  . Shellfish Allergy Hives  . Tramadol Itching    Labs:  Results for orders placed or performed during the hospital encounter of 07/14/17 (from the past 48 hour(s))  CBC     Status: Abnormal   Collection Time: 07/27/17  2:22 AM  Result Value Ref Range   WBC 3.5 (L) 4.0 - 10.5 K/uL   RBC 4.62 4.22 - 5.81 MIL/uL   Hemoglobin 12.7 (L) 13.0 - 17.0 g/dL   HCT 38.0 (L) 39.0 - 52.0 %   MCV 82.3 78.0 - 100.0 fL   MCH 27.5 26.0 - 34.0 pg   MCHC 33.4 30.0 - 36.0 g/dL   RDW 17.0 (H) 11.5 - 15.5 %   Platelets 72 (L) 150 - 400 K/uL     Comment: CONSISTENT WITH PREVIOUS RESULT    Current Facility-Administered Medications  Medication Dose Route Frequency Provider Last Rate Last Dose  . acetaminophen (TYLENOL) tablet 650 mg  650 mg Oral Q6H PRN Etta Quill, DO   650 mg at 07/23/17 2243   Or  . acetaminophen (TYLENOL) suppository 650 mg  650 mg Rectal Q6H PRN Etta Quill, DO      . folic acid (FOLVITE) tablet 1 mg  1 mg Oral Daily Jennette Kettle M, DO   1 mg at 07/27/17 0853  . gi cocktail (Maalox,Lidocaine,Donnatal)  30 mL Oral TID PRN Barton Dubois, MD   30 mL at 07/17/17 1102  . hydrOXYzine (ATARAX/VISTARIL) tablet 25 mg  25 mg Oral BID Jennette Kettle M, DO   25 mg at 07/27/17 2130  . lamoTRIgine (LAMICTAL) tablet 150 mg  150 mg Oral Daily Jennette Kettle M, DO   150 mg at 07/27/17 0853  . methocarbamol (ROBAXIN) tablet 500 mg  500 mg Oral Q12H PRN Barton Dubois, MD   500 mg at 07/23/17 0022  . multivitamin with minerals tablet 1 tablet  1 tablet Oral Daily Etta Quill, DO   1 tablet at 07/27/17 [phone]  . nicotine (NICODERM CQ - dosed in mg/24 hours) patch 21 mg  21 mg Transdermal Daily Muthersbaugh, Hannah, PA-C      . nitroGLYCERIN (NITROSTAT) SL tablet 0.4 mg  0.4 mg Sublingual Q5 min PRN Eulogio Bear U, DO   0.4 mg at 07/15/17 1236  . ondansetron (ZOFRAN) tablet 4 mg  4 mg Oral Q6H PRN Etta Quill, DO       Or  . ondansetron [facility]) injection 4 mg  4 mg Intravenous Q6H PRN Etta Quill, DO      . pantoprazole (PROTONIX) EC tablet 40 mg  40 mg Oral BID Brahmbhatt, Parag, MD   40 mg at 07/27/17 0853  . sucralfate (CARAFATE) 1 GM/10ML suspension 1 g  1 g Oral BID Barton Dubois, MD   1 g at 07/27/17 0853  . thiamine (VITAMIN B-1) tablet 100 mg  100 mg Oral Daily Jennette Kettle M, DO   100 mg at 07/27/17 8469  . traZODone (DESYREL) tablet 100 mg  100 mg Oral QHS Jennette Kettle M, DO   100 mg at 07/26/17 2227    Musculoskeletal: Strength & Muscle Tone: decreased Gait & Station: unable to  stand Patient leans: N/A  Psychiatric Specialty Exam: Physical Exam  Nursing note and vitals reviewed. Constitutional: He is oriented to person, place,  and time.  Cardiovascular: Normal rate.   Neurological: He is alert and oriented to person, place, and time.     ROS  A full 10 point Review of Systems was done, except as stated above, all other Review of Systems were negative.  Blood pressure 122/61, pulse 72, temperature 98.2 F (36.8 C), temperature source Oral, resp. rate 18, height 5\' 8"  (1.727 m), weight 81.9 kg (180 lb 9.6 oz), SpO2 95 %.Body mass index is 27.46 kg/m.  General Appearance: Guarded  Eye Contact:  Good  Speech:  Clear and Coherent  Volume:  Decreased  Mood:  Depressed  Affect:  Constricted and Depressed  Thought Process:  Coherent and Goal Directed  Orientation:  Full (Time, Place, and Person)  Thought Content:  Rumination  Suicidal Thoughts:  Yes.  without intent/plan  Homicidal Thoughts:  No  Memory:  Immediate;   Good Recent;   Fair Remote;   Fair  Judgement:  Fair  Insight:  Fair  Psychomotor Activity:  Decreased  Concentration:  Concentration: Fair and Attention Span: Fair  Recall:  Good  Fund of Knowledge:  Fair  Language:  Good  Akathisia:  Negative  Handed:  Right  AIMS (if indicated):     Assets:  Communication Skills Desire for Improvement Leisure Time Resilience  ADL's:  Intact  Cognition:  WNL  Sleep:        Treatment Plan Summary:   Continue safety sitter Patient meets criteria for acute psychiatric hospitalization and medically stable for substance induced mood disorder, crisis stabilization, safety monitoring. Continue Lamotrigine 150 mg daily for mood stabilization,  Continue Ativan for alcohol withdrawal symptoms  Continue Trazodone 100 mg at bedtime for insomnia. CSW has been working with the placement needs  Patient is also willing to participate in substance abuse rehabilitation medically stable and detox treatment  completed  Disposition: Recommend psychiatric Inpatient admission when medically cleared. Supportive therapy provided about ongoing stressors.  [place], FNP 07/27/2017 6:19 PM

## 2017-07-27 NOTE — Progress Notes (Signed)
PROGRESS NOTE    Patrick [name]  LKG:[id number] DOB: 13-Oct-1951 DOA: 07/14/2017 PCP: Dettinger, Fransisca Kaufmann, MD   Brief Narrative:  Patrick [name] is a 66 y.o. male with medical history significant of EtOH abuse, cocaine abuse, cirrhosis, HCV, apparently esophageal and gastric varices who presented to the ED for 2nd time in 2 days with c/o feeling suicidal and having chest pain.  Upper EGD and colonoscopy last done at [facility] Aug 2017, results not available. Patient is medically stable and awaiting an inpatient bed at a psychiatric hospital for transfer. Clinical social worker working on same.   Assessment & Plan:   Suicidal ideations: present and ongoing. As of 9/17, patient reports ongoing suicidal ideations and up to last night felt that he should "jump off the bridge". I requested psychiatry to see him again and they did continue to recommend acute psychiatric hospitalization for stabilization of substance-induced mood disorder. They recommended continuing lamotrigine 150 MG daily for mood stabilization, trazodone 100 MG at bedtime for insomnia and Ativan as needed for alcohol withdrawal symptoms.  Occult blood positive stool - Hgb stable now, apparently had h/o varicies based on diagnosis from EGD procedure at [facility] continue oral PPI BID, patient has remained stable and is clear from GI stand point for discharge; no endoscopic procedures planned. Recommending follow-up with primary GI at [facility] - Dr. Gertie Fey  in 3-4 weeks after discharge. [facility] consulted during this hospital admission and signed off on 07/17/17.  Hx oc COPD - stable without clinical bronchospasm.   H/o EtOH abuse/coaine abuse - stable without withdrawal signs or symptoms.  Chest pain at the time of admission - probably associated with cocaine use, MSK and reflux symptoms. Cardiac enzymes negative 3, EKG reassuring and no acute abnormalities on telemetry. Chest pain resolved and has not recurred.   Thrombocytopenia/pancytopenia: stable - due to underlying alcoholic cirrhosis, no bleeding. Intermittently monitor. Stable.   Hypokalemia: Replaced.  Left gluteal soft tissue,? Lipoma/sebaceous cyst - Recommended outpatient general surgery consultation.     DVT prophylaxis: SCD's Code Status: Full Code Family Communication:  No family at bedside. Disposition Plan:  Patient will need inpatient psychiatry admission at discharge. He is medically stable and ready to move on once bed available at psych facility. Awaiting for psych bed and clinical social worker input appreciated, apparently on waiting list at [facility].  Consultants:   [facility]-signed off  Psychiatry   Subjective Patient reported a swelling that he's noticed over his left buttock for the last 6 months which has slowly increased in size but not painful or drainage.   Objective: Vitals:   07/26/17 0510 07/26/17 1443 07/26/17 2359 07/27/17 0522  BP: (!) 96/45 (!) 99/56 (!) 114/56 (!) 99/48  Pulse: 70 75 72 70  Resp: 18 19 18 18   Temp: 98.2 F (36.8 C) 98.4 F (36.9 C) 98 F (36.7 C) 98.2 F (36.8 C)  TempSrc: Oral Oral Oral Oral  SpO2: 95% 97% 93% 97%  Weight: 82.1 kg (181 lb)   81.9 kg (180 lb 9.6 oz)  Height:        Intake/Output Summary (Last 24 hours) at 07/27/17 1610 Last data filed at 07/27/17 1330  Gross per 24 hour  Intake              480 ml  Output                0 ml  Net  480 ml   Filed Weights   07/23/17 0540 07/26/17 0510 07/27/17 0522  Weight: 82.8 kg (182 lb 8.7 oz) 82.1 kg (181 lb) 81.9 kg (180 lb 9.6 oz)    Examination:  Gen. exam: Pleasant middle-aged male, moderately built and nourished, sitting up comfortably eating breakfast this morning. Respiratory system: Clear to auscultation. No increased work of breathing. Cardiovascular system: S1 and S2 heard, RRR. No JVD, murmurs or pedal edema. Abdomen: Nondistended, soft and nontender. Normal bowel sounds heard. CNS: Alert  and oriented. No focal neurological deficits. Extremities: Symmetrically normal appearing power. Psychiatry: Flat affect. Skin: Approximately 1 cm diameter cystic swelling noted on the left side of upper gluteal cleft without acute findings.    Data Reviewed: I have personally reviewed following labs and imaging studies  CBC:  Recent Labs Lab 07/23/17 0318 07/27/17 0222  WBC 3.0* 3.5*  HGB 12.7* 12.7*  HCT 37.4* 38.0*  MCV 82.4 82.3  PLT 54* 72*   Basic Metabolic Panel:  Recent Labs Lab 07/23/17 0318 07/24/17 0355  NA 138 140  K 3.4* 3.9  CL 109 114*  CO2 22 22  GLUCOSE 99 96  BUN 9 11  CREATININE 1.11 1.03  CALCIUM 8.5* 8.5*   GFR: Estimated Creatinine Clearance: 70.1 mL/min (by C-G formula based on SCr of 1.03 mg/dL).   Liver Function Tests:  Recent Labs Lab 07/23/17 0318 07/24/17 0355  AST 25 28  ALT 15* 15*  ALKPHOS 100 94  BILITOT 1.2 1.3*  PROT 6.6 6.1*  ALBUMIN 2.9* 2.9*    Radiology Studies: No results found.  Scheduled Meds: . folic acid  1 mg Oral Daily  . hydrOXYzine  25 mg Oral BID  . lamoTRIgine  150 mg Oral Daily  . multivitamin with minerals  1 tablet Oral Daily  . nicotine  21 mg Transdermal Daily  . pantoprazole  40 mg Oral BID  . sucralfate  1 g Oral BID  . thiamine  100 mg Oral Daily  . traZODone  100 mg Oral QHS   Continuous Infusions:    LOS: 12 days    Ayvah Caroll, MD, FACP, FHM. [facility] Pager [phone]  If 7PM-7AM, please contact night-coverage [email or web address] Password [email or web address] 07/27/2017, 4:10 PM

## 2017-07-28 DIAGNOSIS — E7220 Disorder of urea cycle metabolism, unspecified: Secondary | ICD-10-CM

## 2017-07-28 MED ORDER — LACTULOSE 10 GM/15ML PO SOLN
10.0000 g | Freq: Three times a day (TID) | ORAL | Status: DC
  Administered 2017-07-28 – 2017-08-03 (×19): 10 g via ORAL
  Filled 2017-07-28 (×20): qty 15

## 2017-07-28 NOTE — Progress Notes (Signed)
CSW called [facility] and confirmed patient remains on their wait list for inpatient psychiatric treatment. CSW called [facility] and updated patient's [facility] authorization - auth #[id number], good through 08/03/17. CSW to continue to follow and support with discharge when bed available.  Estanislado Emms, [place]

## 2017-07-28 NOTE — Progress Notes (Addendum)
PROGRESS NOTE    [name]  NWG:[id number] DOB: 1951/04/04 DOA: 07/14/2017 PCP: Dettinger, Fransisca Kaufmann, MD   Brief Narrative:  Patrick [name] is a 66 y.o. male with medical history significant of EtOH abuse, cocaine abuse, cirrhosis, HCV, apparently esophageal and gastric varices who presented to the ED for 2nd time in 2 days with c/o feeling suicidal and having chest pain.  Upper EGD and colonoscopy last done at [facility] Aug 2017, results not available. Patient is medically stable and awaiting an inpatient bed at a psychiatric hospital for transfer. Clinical social worker working on same.   Assessment & Plan:   Suicidal ideations: present and ongoing. As of 9/17, patient reports ongoing suicidal ideations and up to last night felt that he should "jump off the bridge". Psychiatry are following and continue to recommend acute psychiatric hospitalization for stabilization of substance-induced mood disorder. They recommended continuing lamotrigine 150 MG daily for mood stabilization, trazodone 100 MG at bedtime for insomnia and Ativan as needed for alcohol withdrawal symptoms.  Occult blood positive stool - Hgb stable now, apparently had h/o varicies based on diagnosis from EGD procedure at [facility], continue oral PPI BID, patient has remained stable and is clear from GI stand point for discharge; no endoscopic procedures planned. Recommending follow-up with primary GI at [facility] - Dr. Gertie [name]  in 3-4 weeks after discharge. [facility] consulted during this hospital admission and signed off on 07/17/17.  Hx oc COPD - stable without clinical bronchospasm.   H/o EtOH abuse/coaine abuse - stable without withdrawal signs or symptoms.  Chest pain at the time of admission - probably associated with cocaine use, MSK and reflux symptoms. Cardiac enzymes negative 3, EKG reassuring and no acute abnormalities on telemetry. Chest pain resolved and has not recurred.   Thrombocytopenia/pancytopenia: stable -  due to underlying alcoholic cirrhosis, no bleeding. Intermittently monitor. Stable.   Hypokalemia: Replaced.  Left gluteal soft tissue,? Lipoma/sebaceous cyst - Recommended outpatient general surgery consultation.  Hyperammonemia/Cirrhosis/HCV/esophageal and gastric varices On 9/19, patient reported that he takes lactulose at home for "high ammonia". However lactulose not listed on his home medications. Started lactulose. Ammonia on 8/30 was 129. Clinically not encephalopathic.     DVT prophylaxis: SCD's Code Status: Full Code Family Communication:  No family at bedside. Disposition Plan:  Patient will need inpatient psychiatry admission at discharge. He is medically stable and ready to move on once bed available at psych facility. Awaiting for psych bed and clinical social worker input appreciated, apparently on waiting list at [facility].  Consultants:   [facility]-signed off  Psychiatry   Subjective Seen this morning. Ongoing suicidal ideations but denied any other complaints. Subsequently reported to RN that he takes lactulose. Safety sitter at bedside.   Objective: Vitals:   07/26/17 2359 07/27/17 0522 07/27/17 1640 07/28/17 0450  BP: (!) 114/56 (!) 99/48 122/61 (!) 90/51  Pulse: 72 70 72 72  Resp: 18 18 18 17   Temp: 98 F (36.7 C) 98.2 F (36.8 C) 98.2 F (36.8 C) 98.1 F (36.7 C)  TempSrc: Oral Oral Oral Oral  SpO2: 93% 97% 95% 96%  Weight:  81.9 kg (180 lb 9.6 oz)  82.6 kg (182 lb 3.2 oz)  Height:        Intake/Output Summary (Last 24 hours) at 07/28/17 1056 Last data filed at 07/28/17 0900  Gross per 24 hour  Intake              960 ml  Output  0 ml  Net              960 ml   Filed Weights   07/26/17 0510 07/27/17 0522 07/28/17 0450  Weight: 82.1 kg (181 lb) 81.9 kg (180 lb 9.6 oz) 82.6 kg (182 lb 3.2 oz)    Examination:  Gen. exam: Pleasant middle-aged male, moderately built and nourished, lying comfortably supine in bed. Respiratory system:  Clear to auscultation. No increased work of breathing. Stable Cardiovascular system: S1 and S2 heard, RRR. No JVD, murmurs or pedal edema. Stable Abdomen: Nondistended, soft and nontender. Normal bowel sounds heard. Stable CNS: Alert and oriented. No focal neurological deficits. Extremities: Symmetrically normal appearing power. Psychiatry: Flat affect. Skin: As examined on 9/18, Approximately 1 cm diameter cystic swelling noted on the left side of upper gluteal cleft without acute findings.    Data Reviewed: I have personally reviewed following labs and imaging studies  CBC:  Recent Labs Lab 07/23/17 0318 07/27/17 0222  WBC 3.0* 3.5*  HGB 12.7* 12.7*  HCT 37.4* 38.0*  MCV 82.4 82.3  PLT 54* 72*   Basic Metabolic Panel:  Recent Labs Lab 07/23/17 0318 07/24/17 0355  NA 138 140  K 3.4* 3.9  CL 109 114*  CO2 22 22  GLUCOSE 99 96  BUN 9 11  CREATININE 1.11 1.03  CALCIUM 8.5* 8.5*   GFR: Estimated Creatinine Clearance: 75.9 mL/min (by C-G formula based on SCr of 1.03 mg/dL).   Liver Function Tests:  Recent Labs Lab 07/23/17 0318 07/24/17 0355  AST 25 28  ALT 15* 15*  ALKPHOS 100 94  BILITOT 1.2 1.3*  PROT 6.6 6.1*  ALBUMIN 2.9* 2.9*    Radiology Studies: No results found.  Scheduled Meds: . folic acid  1 mg Oral Daily  . hydrOXYzine  25 mg Oral BID  . lactulose  10 g Oral TID  . lamoTRIgine  150 mg Oral Daily  . multivitamin with minerals  1 tablet Oral Daily  . nicotine  21 mg Transdermal Daily  . pantoprazole  40 mg Oral BID  . sucralfate  1 g Oral BID  . thiamine  100 mg Oral Daily  . traZODone  100 mg Oral QHS   Continuous Infusions:    LOS: 13 days    Amillion Macchia, MD, FACP, FHM. [facility] Pager [phone]  If 7PM-7AM, please contact night-coverage [email or web address] Password [email or web address] 07/28/2017, 10:56 AM

## 2017-07-28 NOTE — Consult Note (Signed)
[facility] Consult   Reason for Consult:  Suicide without plan and recently relapsed on alcohol and cocaine Referring Physician:  Dr. Eliseo Squires Patient Identification: Patrick [name] MRN:  [id number] Principal Diagnosis: Occult blood positive stool Diagnosis:   Patient Active Problem List   Diagnosis Date Noted  . Evaluation by medical service required [Z00.00]   . Suicidal ideations [R45.851] 07/15/2017  . Alcohol-induced mood disorder ([place]) [F10.94] 07/14/2017  . Occult blood positive stool [R19.5] 07/14/2017  . ETOH abuse [F10.10] 07/14/2017  . Thrombocytopenia ([place]) [D69.6] 07/14/2017  . Atypical chest pain [R07.89] 07/14/2017  . COPD (chronic obstructive pulmonary disease) ([place]) [J44.9] 07/08/2017  . Hepatic encephalopathy ([place]) [K72.90] 01/05/2015  . Bipolar 1 disorder ([place]) [F31.9] 01/05/2015  . Insomnia [G47.00] 01/05/2015  . Anemia, chronic disease [D63.8] 01/05/2015  . Neuropathy [G62.9] 01/05/2015  . Cirrhosis ([place]) [K74.60] 01/05/2015  . History of stroke [Z86.73] 01/05/2015  . Altered mental status [R41.82]     Total Time spent with patient: 1 hour  Subjective:   Patrick [name] is a 66 y.o. male patient admitted with substance abuse and suicide ideation.  HPI: Patrick [name] is a 66 years old male admitted to the: [facility] for thrombocytopenia, occult blood positive stool, chest pain, cirrhosis and suicidal ideation. Patient reported a been drinking and become homeless and no's psychosocial support system. Patient has a long history of alcohol dependence and recent relapse after long period of sobriety. Patient stated he wants to kill himself and has a plan to jump off of the bridge. Patient is willing to participate inpatient psychiatric hospitalization for crisis stabilization, safety monitoring and also wanted to be placed in substance abuse rehabilitation treatment. Patient does not contract for safety during this evaluation.  Past Psychiatric  History: Patient has been suffering with alcohol dependence and recent relapse. Was previously admitted into different detox treatment and rehabilitation.   07/22/2017 Interval history: Patient is seen for psychiatric consultation and evaluation follow-up as requested by CSW and Dr. Candiss Norse. Patient has been in contact with the CSW who reported to him that she has been working hard to find a placement for him. Patient reported he continued to feel depression, anxiety, stress regarding no place for him to living. Patient reportedly wandering if he can go on stay shelter in [place] where his son is working, but did not talk to [place] about this idea. Patient is able to get out of his bed use restroom and changes clothes and somewhat calm and pleasant. Patient apologizes for his crusting  during my last visit. Patient reported he has been suffering with mood disorder, alcohol dependence and recent relapse after long period of sober. Patient reported he was able to stay in [place] in [place] for about a year and then 6-7 months with his son and his wife and then put him out of the house because of conflicts and keep on drinking and smoking tobacco. Patient reportedly been here and there in different shelters who does not want him because of COPD and may need oxygen at home. Patient continued to endorse suicidal ideation but no intention or plan to do my evaluation today.  Patient is willing to sign voluntarily for inpatient psychiatric hospitalization as he cannot contract for safety at this time. Patient has plans of participating alcoholic anonymous when discharged from the hospital.  Interval Note 07/26/17: Patient continues to ruminate about relapsing on alcohol and that he is severely depressed and is still having passive SI. He reports taking  psychiatric medications for a long time, but feels they do not work. Patient is in need of hospitalization for mental stability and continued monitoring for  withdrawals. Discussed with CSW and they are still working on placement and patient is on [facility] wait list at this time.  Interval History 07/27/17: Spoke with CSW and nursing staff. Patient is still willing to voluntarily be admitted to an inpatient psychiatric facility. CSW stated they would notify me if they needed any extra assistance with the patient.  Interval History 07/28/17: Patient has remained the same and is still willing to go to inpatient psychiatry voluntary. CSW is still working on placement and I was informed that it may be [facility].   Risk to Self: Suicidal Ideation: Yes-Currently Present Suicidal Intent: Yes-Currently Present Is patient at risk for suicide?: Yes Suicidal Plan?: Yes-Currently Present Specify Current Suicidal Plan: Jump from a bridge Access to Means: Yes Specify Access to Suicidal Means: Bridges and roadways What has been your use of drugs/alcohol within the last 12 months?: ETOH How many times?: 1 Other Self Harm Risks: None Triggers for Past Attempts: Unpredictable Intentional Self Injurious Behavior: None Risk to Others: Homicidal Ideation: No Thoughts of Harm to Others: No Current Homicidal Intent: No Current Homicidal Plan: No Access to Homicidal Means: No Identified Victim: No one History of harm to others?: No Assessment of Violence: None Noted Violent Behavior Description: None reported Does patient have access to weapons?: No Criminal Charges Pending?: No Does patient have a court date: No Prior Inpatient Therapy: Prior Inpatient Therapy: Yes Prior Therapy Dates: About a year ago. Prior Therapy Facilty/Provider(s): [facility]; [facility],  Reason for Treatment: SI Prior Outpatient Therapy: Prior Outpatient Therapy: Yes Prior Therapy Dates: Current Prior Therapy Facilty/Provider(s): [facility] Reason for Treatment: med management Does patient have an ACCT team?: No Does patient have Intensive In-House Services?  : No Does patient have [facility]  services? : No Does patient have P4[facility] services?: No  Past Medical History:  Past Medical History:  Diagnosis Date  . Anxiety   . Bipolar 1 disorder ([place])   . Cirrhosis ([place])   . COPD (chronic obstructive pulmonary disease) ([place])   . Depression   . Heart murmur   . Hepatitis C   . Oxygen deficiency   . Polysubstance abuse   . Stroke [facility])     Past Surgical History:  Procedure Laterality Date  . arm surgery     Family History:  Family History  Problem Relation Age of Onset  . Arthritis Mother   . Cancer Father        lung  . Diabetes Father   . Mental illness Brother        depression  . Heart disease Paternal Grandmother   . Cancer Paternal Grandmother        skin  . Diabetes Paternal Uncle    Family Psychiatric  History: He has a family history of mental health illness stating his father was dx's with Paranoid Schizophrenia. Social History:  History  Alcohol Use  . Yes    Comment: 26 months alcohol free- relapse on 07/10/17     History  Drug Use No    Social History   Social History  . Marital status: Divorced    Spouse name: N/A  . Number of children: N/A  . Years of education: N/A   Social History Main Topics  . Smoking status: Current Every Day Smoker    Packs/day: 0.25    Years: 50.00  Types: Cigarettes  . Smokeless tobacco: Never Used  . Alcohol use Yes     Comment: 26 months alcohol free- relapse on 07/10/17  . Drug use: No  . Sexual activity: No   Other Topics Concern  . None   Social History Narrative  . None   Additional Social History:    Allergies:   Allergies  Allergen Reactions  . Shellfish Allergy Hives  . Tramadol Itching    Labs:  Results for orders placed or performed during the hospital encounter of 07/14/17 (from the past 48 hour(s))  CBC     Status: Abnormal   Collection Time: 07/27/17  2:22 AM  Result Value Ref Range   WBC 3.5 (L) 4.0 - 10.5 K/uL   RBC 4.62 4.22 - 5.81 MIL/uL   Hemoglobin 12.7 (L) 13.0 - 17.0  g/dL   HCT 38.0 (L) 39.0 - 52.0 %   MCV 82.3 78.0 - 100.0 fL   MCH 27.5 26.0 - 34.0 pg   MCHC 33.4 30.0 - 36.0 g/dL   RDW 17.0 (H) 11.5 - 15.5 %   Platelets 72 (L) 150 - 400 K/uL    Comment: CONSISTENT WITH PREVIOUS RESULT    Current Facility-Administered Medications  Medication Dose Route Frequency Provider Last Rate Last Dose  . acetaminophen (TYLENOL) tablet 650 mg  650 mg Oral Q6H PRN Etta Quill, DO   650 mg at 07/23/17 2243   Or  . acetaminophen (TYLENOL) suppository 650 mg  650 mg Rectal Q6H PRN Etta Quill, DO      . folic acid (FOLVITE) tablet 1 mg  1 mg Oral Daily Jennette Kettle M, DO   1 mg at 07/28/17 0845  . gi cocktail (Maalox,Lidocaine,Donnatal)  30 mL Oral TID PRN Barton Dubois, MD   30 mL at 07/17/17 1102  . hydrOXYzine (ATARAX/VISTARIL) tablet 25 mg  25 mg Oral BID Jennette Kettle M, DO   25 mg at 07/28/17 0845  . lactulose (CHRONULAC) 10 GM/15ML solution 10 g  10 g Oral TID Modena Jansky, MD   10 g at 07/28/17 1134  . lamoTRIgine (LAMICTAL) tablet 150 mg  150 mg Oral Daily Jennette Kettle M, DO   150 mg at 07/28/17 0846  . methocarbamol (ROBAXIN) tablet 500 mg  500 mg Oral Q12H PRN Barton Dubois, MD   500 mg at 07/23/17 0022  . multivitamin with minerals tablet 1 tablet  1 tablet Oral Daily Jennette Kettle M, DO   1 tablet at 07/28/17 0845  . nicotine (NICODERM CQ - dosed in mg/24 hours) patch 21 mg  21 mg Transdermal Daily Muthersbaugh, Hannah, PA-C      . nitroGLYCERIN (NITROSTAT) SL tablet 0.4 mg  0.4 mg Sublingual Q5 min PRN Eulogio Bear U, DO   0.4 mg at 07/15/17 1236  . ondansetron (ZOFRAN) tablet 4 mg  4 mg Oral Q6H PRN Etta Quill, DO       Or  . ondansetron [facility]) injection 4 mg  4 mg Intravenous Q6H PRN Etta Quill, DO      . pantoprazole (PROTONIX) EC tablet 40 mg  40 mg Oral BID Brahmbhatt, Parag, MD   40 mg at 07/28/17 0845  . sucralfate (CARAFATE) 1 GM/10ML suspension 1 g  1 g Oral BID Barton Dubois, MD   1 g at 07/28/17 0845  .  thiamine (VITAMIN B-1) tablet 100 mg  100 mg Oral Daily Jennette Kettle M, DO   100 mg at 07/28/17 0846  .  traZODone (DESYREL) tablet 100 mg  100 mg Oral QHS Jennette Kettle M, DO   100 mg at 07/28/17 0044    Musculoskeletal: Strength & Muscle Tone: decreased Gait & Station: unable to stand Patient leans: N/A  Psychiatric Specialty Exam: Physical Exam  Nursing note and vitals reviewed. Constitutional: He is oriented to person, place, and time.  Cardiovascular: Normal rate.   Neurological: He is alert and oriented to person, place, and time.     ROS  A full 10 point Review of Systems was done, except as stated above, all other Review of Systems were negative.  Blood pressure 121/66, pulse 67, temperature 97.9 F (36.6 C), temperature source Oral, resp. rate 18, height 5\' 8"  (1.727 m), weight 82.6 kg (182 lb 3.2 oz), SpO2 98 %.Body mass index is 27.7 kg/m.  General Appearance: Guarded  Eye Contact:  Good  Speech:  Clear and Coherent  Volume:  Decreased  Mood:  Depressed  Affect:  Constricted and Depressed  Thought Process:  Coherent and Goal Directed  Orientation:  Full (Time, Place, and Person)  Thought Content:  Rumination  Suicidal Thoughts:  Yes.  without intent/plan  Homicidal Thoughts:  No  Memory:  Immediate;   Good Recent;   Fair Remote;   Fair  Judgement:  Fair  Insight:  Fair  Psychomotor Activity:  Decreased  Concentration:  Concentration: Fair and Attention Span: Fair  Recall:  Good  Fund of Knowledge:  Fair  Language:  Good  Akathisia:  Negative  Handed:  Right  AIMS (if indicated):     Assets:  Communication Skills Desire for Improvement Leisure Time Resilience  ADL's:  Intact  Cognition:  WNL  Sleep:        Treatment Plan Summary:   Continue safety sitter Patient meets criteria for acute psychiatric hospitalization and medically stable for substance induced mood disorder, crisis stabilization, safety monitoring. Continue Lamotrigine 150 mg daily  for mood stabilization,  Continue Ativan for alcohol withdrawal symptoms  Continue Trazodone 100 mg at bedtime for insomnia. CSW has been working with the placement needs  Disposition: Recommend psychiatric Inpatient admission when medically cleared. Supportive therapy provided about ongoing stressors.  [place], FNP 07/28/2017 3:04 PM

## 2017-07-29 NOTE — Progress Notes (Signed)
PROGRESS NOTE    Patrick [name]  IRC:[id number] DOB: Aug 26, 1951 DOA: 07/14/2017 PCP: Dettinger, Fransisca Kaufmann, MD   Brief Narrative:  Patrick [name] is a 66 y.o. male with medical history significant of EtOH abuse, cocaine abuse, cirrhosis, HCV, apparently esophageal and gastric varices who presented to the ED for 2nd time in 2 days with c/o feeling suicidal and having chest pain.  Upper EGD and colonoscopy last done at [facility] Aug 2017, results not available. Patient is medically stable and awaiting an inpatient bed at a psychiatric hospital for transfer. Clinical social worker working on same and thus far have not heard of a bed availability.   Assessment & Plan:   Suicidal ideations: present and ongoing. Psychiatry are following and continue to recommend acute psychiatric hospitalization for stabilization of substance-induced mood disorder. They recommended continuing lamotrigine 150 MG daily for mood stabilization, trazodone 100 MG at bedtime for insomnia and Ativan as needed for alcohol withdrawal symptoms. No alcohol withdrawal symptoms noted for several days, DC CIWA protocol.  Occult blood positive stool - Hgb stable now, apparently had h/o varicies based on diagnosis from EGD procedure at [facility], continue oral PPI BID, patient has remained stable and is clear from GI stand point for discharge; no endoscopic procedures planned. Recommending follow-up with primary GI at [facility] - Dr. Gertie Fey  in 3-4 weeks after discharge. [facility] consulted during this hospital admission and signed off on 07/17/17.  Hx oc COPD - stable without clinical bronchospasm.   H/o EtOH abuse/coaine abuse - stable without withdrawal signs or symptoms. DC CIWA.  Chest pain at the time of admission - probably associated with cocaine use, MSK and reflux symptoms. Cardiac enzymes negative 3, EKG reassuring and no acute abnormalities on telemetry. Chest pain resolved and has not recurred.   Thrombocytopenia/pancytopenia: stable - due to underlying alcoholic cirrhosis, no bleeding. Intermittently monitor. Stable.   Hypokalemia: Replaced.  Left gluteal soft tissue lump,? Lipoma/sebaceous cyst - Recommended outpatient general surgery consultation.  Hyperammonemia/Cirrhosis/HCV/esophageal and gastric varices On 9/19, patient reported that he takes lactulose at home for "high ammonia". However lactulose not listed on his home medications. Started lactulose. Ammonia on 8/30 was 129. Clinically not encephalopathic.     DVT prophylaxis: SCD's Code Status: Full Code Family Communication:  No family at bedside. Disposition Plan:  Patient will need inpatient psychiatry admission at discharge. He is medically stable and ready to move on once bed available at psych facility. Awaiting for psych bed and clinical social worker input appreciated, apparently on waiting list at [facility].  Consultants:   [facility]-signed off  Psychiatry   Subjective Ongoing intermittent but mild suicidal ideations. No physical complaints reported. Safety sitter at bedside.   Objective: Vitals:   07/28/17 0450 07/28/17 1340 07/28/17 1900 07/29/17 0416  BP: (!) 90/51 121/66 121/63 123/63  Pulse: 72 67 75 76  Resp: 17 18 18 16   Temp: 98.1 F (36.7 C) 97.9 F (36.6 C) 97.8 F (36.6 C) 97.9 F (36.6 C)  TempSrc: Oral Oral Oral Oral  SpO2: 96% 98% 96% 94%  Weight: 82.6 kg (182 lb 3.2 oz)   82.1 kg (181 lb 1.5 oz)  Height:        Intake/Output Summary (Last 24 hours) at 07/29/17 1135 Last data filed at 07/29/17 0125  Gross per 24 hour  Intake              495 ml  Output  0 ml  Net              495 ml   Filed Weights   07/27/17 0522 07/28/17 0450 07/29/17 0416  Weight: 81.9 kg (180 lb 9.6 oz) 82.6 kg (182 lb 3.2 oz) 82.1 kg (181 lb 1.5 oz)    Examination:  Gen. exam: Pleasant middle-aged male, moderately built and nourished, lying comfortably supine in bed. Respiratory system:  Clear to auscultation. No increased work of breathing. Stable without change Cardiovascular system: S1 and S2 heard, RRR. No JVD, murmurs or pedal edema. Stable Abdomen: Nondistended, soft and nontender. Normal bowel sounds heard. Stable CNS: Alert and oriented. No focal neurological deficits. Extremities: Symmetrically normal appearing power. Psychiatry: Flat affect. Stable without change. Skin: As examined on 9/18, Approximately 1 cm diameter cystic swelling noted on the left side of upper gluteal cleft without acute findings.    Data Reviewed: I have personally reviewed following labs and imaging studies  CBC:  Recent Labs Lab 07/23/17 0318 07/27/17 0222  WBC 3.0* 3.5*  HGB 12.7* 12.7*  HCT 37.4* 38.0*  MCV 82.4 82.3  PLT 54* 72*   Basic Metabolic Panel:  Recent Labs Lab 07/23/17 0318 07/24/17 0355  NA 138 140  K 3.4* 3.9  CL 109 114*  CO2 22 22  GLUCOSE 99 96  BUN 9 11  CREATININE 1.11 1.03  CALCIUM 8.5* 8.5*   GFR: Estimated Creatinine Clearance: 75.7 mL/min (by C-G formula based on SCr of 1.03 mg/dL).   Liver Function Tests:  Recent Labs Lab 07/23/17 0318 07/24/17 0355  AST 25 28  ALT 15* 15*  ALKPHOS 100 94  BILITOT 1.2 1.3*  PROT 6.6 6.1*  ALBUMIN 2.9* 2.9*    Radiology Studies: No results found.  Scheduled Meds: . folic acid  1 mg Oral Daily  . hydrOXYzine  25 mg Oral BID  . lactulose  10 g Oral TID  . lamoTRIgine  150 mg Oral Daily  . multivitamin with minerals  1 tablet Oral Daily  . nicotine  21 mg Transdermal Daily  . pantoprazole  40 mg Oral BID  . sucralfate  1 g Oral BID  . thiamine  100 mg Oral Daily  . traZODone  100 mg Oral QHS   Continuous Infusions:    LOS: 14 days    Tyren Dugar, MD, FACP, FHM. [facility] Pager [phone]  If 7PM-7AM, please contact night-coverage [email or web address] Password [email or web address] 07/29/2017, 11:35 AM

## 2017-07-29 NOTE — Progress Notes (Signed)
CSW received call from [facility]. Patient remains on wait list there. CSW to follow and support with discharge when bed available.  Patrick [name], [place]

## 2017-07-29 NOTE — Care Management Important Message (Signed)
Important Message  Patient Details  Name: Patrick [name] MRN: [id number] Date of Birth: Dec 24, 1950   Medicare Important Message Given:  Yes    [name] May 07/29/2017, 10:43 AM

## 2017-07-29 NOTE — Progress Notes (Signed)
Tech offered Pt a bath. Pt stated "I had a shower on yesterday and do not want one today".

## 2017-07-29 NOTE — Plan of Care (Signed)
Problem: Education: Goal: Ability to make informed decisions regarding treatment will improve Outcome: Progressing Patient is alert and oriented, able to make wants/needs known.

## 2017-07-30 NOTE — Progress Notes (Signed)
PROGRESS NOTE    Patrick [name]  [id number]:[id number] DOB: Mar 13, 1951 DOA: 07/14/2017 PCP: Dettinger, Fransisca Kaufmann, MD    Brief Narrative:  66 year old male who presented chest pain. 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 Patient complain of chest pain, for last 3 days prior to hospitalization, pressure in nature, precordial, no radiation, no improving worsening factors, occurred after using cocaine. On initial physical examination blood pressure 116/54, heart rate 73, respiratory rate 14, oxygen saturation 94%. Moist mucous membranes, lungs were clear to auscultation bilaterally, heart S1-S2 present rhythmic, abdomen was soft nontender, no lower extremity edema. Hemoccult test was positive. Hemoglobin 12 from 14.   Admitted to the hospital working diagnosis of acute blood loss anemia likely upper GI bleed, in the setting of suicidal ideations.  Assessment & Plan:   Principal Problem:   Occult blood positive stool Active Problems:   Cirrhosis (HCC)   ETOH abuse   Thrombocytopenia (HCC)   Atypical chest pain   Suicidal ideations   Evaluation by medical service required   1. Suicidal ideations. Will continue one to one sitter, and suicidal precautions, patient waiting for psychiatric facility. Not agitated or confused. Continue lamotrigine and trazodone.   2. Alcohol abuse, cocaine abuse. No signs of withdrawal, will continue with neuro checks per unit protocol, thiamine, multivitamins and folic acid.   3. Thrombocytopenia. Stable with no signs of bleeding.   4. 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 No signs of encephalopathy, will continue lactulose 10 g tid.   5. Atypical chest pain. Patient chest pain free, rule out for acute coronay syndrome.    DVT prophylaxis: scd  Code Status: full Family Communication:  Disposition Plan: Inpatient  psych   Consultants:   GI  Procedures:     Antimicrobials:       Subjective: Patient with sitter at the bedside, no chest pain or dyspnea, no nausea or vomiting, tolerating po well.   Objective: Vitals:   07/28/17 1900 07/29/17 0416 07/29/17 1345 07/29/17 2014  BP: 121/63 123/63 (!) 114/55 118/67  Pulse: 75 76 66 72  Resp: 18 16 16 16   Temp: 97.8 F (36.6 C) 97.9 F (36.6 C)  97.8 F (36.6 C)  TempSrc: Oral Oral  Oral  SpO2: 96% 94% 97% 93%  Weight:  82.1 kg (181 lb 1.5 oz)    Height:        Intake/Output Summary (Last 24 hours) at 07/30/17 1005 Last data filed at 07/29/17 2100  Gross per 24 hour  Intake              240 ml  Output                0 ml  Net              240 ml   Filed Weights   07/27/17 0522 07/28/17 0450 07/29/17 0416  Weight: 81.9 kg (180 lb 9.6 oz) 82.6 kg (182 lb 3.2 oz) 82.1 kg (181 lb 1.5 oz)    Examination:  General: Not in pain or dyspnea Neurology: Awake and alert, non focal  E ENT: mild pallor, no icterus, oral mucosa moist Cardiovascular: No JVD. S1-S2 present, rhythmic, no gallops, rubs, or murmurs. No lower extremity edema. Pulmonary: vesicular breath sounds bilaterally, adequate air movement, no wheezing, rhonchi or rales. Gastrointestinal. Abdomen flat, no organomegaly, non tender, no rebound or guarding Skin. No rashes Musculoskeletal: no  joint deformities     Data Reviewed: I have personally reviewed following labs and imaging studies  CBC:  Recent Labs Lab 07/27/17 0222  WBC 3.5*  HGB 12.7*  HCT 38.0*  MCV 82.3  PLT 72*   Basic Metabolic Panel:  Recent Labs Lab 07/24/17 0355  NA 140  K 3.9  CL 114*  CO2 22  GLUCOSE 96  BUN 11  CREATININE 1.03  CALCIUM 8.5*   GFR: Estimated Creatinine Clearance: 75.7 mL/min (by C-G formula based on SCr of 1.03 mg/dL). Liver Function Tests:  Recent Labs Lab 07/24/17 0355  AST 28  ALT 15*  ALKPHOS 94  BILITOT 1.3*  PROT 6.1*  ALBUMIN 2.9*   No results  for input(s): LIPASE, AMYLASE in the last 168 hours. No results for input(s): AMMONIA in the last 168 hours. Coagulation Profile: No results for input(s): INR, PROTIME in the last 168 hours. Cardiac Enzymes: No results for input(s): CKTOTAL, CKMB, CKMBINDEX, TROPONINI in the last 168 hours. BNP (last 3 results) No results for input(s): PROBNP in the last 8760 hours. HbA1C: No results for input(s): HGBA1C in the last 72 hours. CBG: No results for input(s): GLUCAP in the last 168 hours. Lipid Profile: No results for input(s): CHOL, HDL, LDLCALC, TRIG, CHOLHDL, LDLDIRECT in the last 72 hours. Thyroid Function Tests: No results for input(s): TSH, T4TOTAL, FREET4, T3FREE, THYROIDAB in the last 72 hours. Anemia Panel: No results for input(s): VITAMINB12, FOLATE, FERRITIN, TIBC, IRON, RETICCTPCT in the last 72 hours.    Radiology Studies: I have reviewed all of the imaging during this hospital visit personally     Scheduled Meds: . folic acid  1 mg Oral Daily  . hydrOXYzine  25 mg Oral BID  . lactulose  10 g Oral TID  . lamoTRIgine  150 mg Oral Daily  . multivitamin with minerals  1 tablet Oral Daily  . nicotine  21 mg Transdermal Daily  . pantoprazole  40 mg Oral BID  . sucralfate  1 g Oral BID  . thiamine  100 mg Oral Daily  . traZODone  100 mg Oral QHS   Continuous Infusions:   LOS: 15 days        Sherilee Smotherman Gerome Apley, MD [facility] Pager [phone]

## 2017-07-31 NOTE — Progress Notes (Signed)
Patient ID: Patrick [name], male   DOB: 07-05-1951, 66 y.o.   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 He presented to [facility] with chest pain for 3 days prior to the admission, nonradiating, occurred after using cocaine. He also reported feeling suicidal. In addition, patient was found to have occult blood positive stool with 2 gm drop in hemoglobin on the admission.   Assessment & Plan:   Principal Problem:   Occult blood positive stool / Acute blood loss anemia  - Hgb stable at this time - GI signed off, recommended follow up with [facility] (where pt follows regularly) - No reports of bleeding - Check CBC in am  Active Problems:   Cirrhosis (HCC) / Hepatitis C - Check ammonia level in am - Continue Lactulose     ETOH abuse - Alcohol level 219 on admission - No reports of withdrawals    Thrombocytopenia (HCC) - Due to bone marrow suppression from alcohol abuse - Check CBC tomorrow morning    Atypical chest pain - ACS ruled out - No reports of chest pain    Suicidal ideations - Awaiting placement to inpatient psych   DVT prophylaxis: SCD's bilaterally Code Status: full code  Family Communication: no family at the bedside Disposition Plan: awaiting inpatient psych bed   Consultants:   Psych   Procedures:   None   Antimicrobials:   None    Subjective: No overnight events.  Objective: Vitals:   07/29/17 2014 07/30/17 1400 07/31/17 0042 07/31/17 0520  BP: 118/67 (!) 99/54 115/61 (!) 101/51  Pulse: 72 67 67 68  Resp: 16 16  15   Temp: 97.8 F (36.6 C) 97.7 F (36.5 C)  98.3 F (36.8 C)  TempSrc: Oral Oral  Oral  SpO2: 93% 97%  94%  Weight:      Height:        Intake/Output Summary (Last 24 hours) at 07/31/17  0824 Last data filed at 07/30/17 1200  Gross per 24 hour  Intake              240 ml  Output                0 ml  Net              240 ml   Filed Weights   07/27/17 0522 07/28/17 0450 07/29/17 0416  Weight: 81.9 kg (180 lb 9.6 oz) 82.6 kg (182 lb 3.2 oz) 82.1 kg (181 lb 1.5 oz)    Examination:  General exam: Appears calm and comfortable  Respiratory system: Clear to auscultation. Respiratory effort normal. Cardiovascular system: S1 & S2 heard, RRR. No JVD, murmurs, rubs, gallops or clicks. No pedal edema. Gastrointestinal system: Abdomen is nondistended, soft and nontender. No organomegaly or masses felt. Normal bowel sounds heard. Central nervous system: Alert and oriented. No focal neurological deficits. Extremities: Symmetric 5 x 5 power. Skin: No rashes, lesions or ulcers Psychiatry: Judgement and insight appear normal. Mood & affect appropriate.   Data Reviewed: I have personally reviewed following labs and imaging studies  CBC:  Recent Labs Lab 07/27/17 0222  WBC 3.5*  HGB 12.7*  HCT 38.0*  MCV 82.3  PLT 72*   Basic Metabolic Panel: No results for input(s): NA, K, CL,  CO2, GLUCOSE, BUN, CREATININE, CALCIUM, MG, PHOS in the last 168 hours. GFR: Estimated Creatinine Clearance: 75.7 mL/min (by C-G formula based on SCr of 1.03 mg/dL). Liver Function Tests: No results for input(s): AST, ALT, ALKPHOS, BILITOT, PROT, ALBUMIN in the last 168 hours. No results for input(s): LIPASE, AMYLASE in the last 168 hours. No results for input(s): AMMONIA in the last 168 hours. Coagulation Profile: No results for input(s): INR, PROTIME in the last 168 hours. Cardiac Enzymes: No results for input(s): CKTOTAL, CKMB, CKMBINDEX, TROPONINI in the last 168 hours. BNP (last 3 results) No results for input(s): PROBNP in the last 8760 hours. HbA1C: No results for input(s): HGBA1C in the last 72 hours. CBG: No results for input(s): GLUCAP in the last 168 hours. Lipid Profile: No  results for input(s): CHOL, HDL, LDLCALC, TRIG, CHOLHDL, LDLDIRECT in the last 72 hours. Thyroid Function Tests: No results for input(s): TSH, T4TOTAL, FREET4, T3FREE, THYROIDAB in the last 72 hours. Anemia Panel: No results for input(s): VITAMINB12, FOLATE, FERRITIN, TIBC, IRON, RETICCTPCT in the last 72 hours. Urine analysis:  Sepsis Labs: @LABRCNTIP (procalcitonin:4,lacticidven:4)   )No results found for this or any previous visit (from the past 240 hour(s)).    Radiology Studies: No results found.   Scheduled Meds: . folic acid  1 mg Oral Daily  . hydrOXYzine  25 mg Oral BID  . lactulose  10 g Oral TID  . lamoTRIgine  150 mg Oral Daily  . multivitamin with minerals  1 tablet Oral Daily  . nicotine  21 mg Transdermal Daily  . pantoprazole  40 mg Oral BID  . sucralfate  1 g Oral BID  . thiamine  100 mg Oral Daily  . traZODone  100 mg Oral QHS   Continuous Infusions:   LOS: 16 days    Time spent: 25 minutes  Greater than 50% of the time spent on counseling and coordinating the care.   Leisa Lenz, MD [facility] Pager [phone]  If 7PM-7AM, please contact night-coverage [email or web address] Password [facility] 07/31/2017, 8:24 AM

## 2017-08-01 LAB — CBC
HEMATOCRIT: 37.3 % — AB (ref 39.0–52.0)
Hemoglobin: 12.3 g/dL — ABNORMAL LOW (ref 13.0–17.0)
MCH: 27.4 pg (ref 26.0–34.0)
MCHC: 33 g/dL (ref 30.0–36.0)
MCV: 83.1 fL (ref 78.0–100.0)
PLATELETS: 59 10*3/uL — AB (ref 150–400)
RBC: 4.49 MIL/uL (ref 4.22–5.81)
RDW: 17 % — AB (ref 11.5–15.5)
WBC: 2.9 10*3/uL — AB (ref 4.0–10.5)

## 2017-08-01 LAB — BASIC METABOLIC PANEL
ANION GAP: 6 (ref 5–15)
BUN: 8 mg/dL (ref 6–20)
CALCIUM: 8.7 mg/dL — AB (ref 8.9–10.3)
CO2: 21 mmol/L — ABNORMAL LOW (ref 22–32)
Chloride: 112 mmol/L — ABNORMAL HIGH (ref 101–111)
Creatinine, Ser: 1.04 mg/dL (ref 0.61–1.24)
GFR calc Af Amer: >60 mL/min (ref >60)
GLUCOSE: 91 mg/dL (ref 65–99)
Potassium: 3.6 mmol/L (ref 3.5–5.1)
Sodium: 139 mmol/L (ref 135–145)

## 2017-08-01 LAB — AMMONIA: AMMONIA: 113 umol/L — AB (ref 9–35)

## 2017-08-01 NOTE — Progress Notes (Signed)
Patient ID: Patrick [name], male   DOB: 1950/12/07, 66 y.o.   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 He presented to [facility] with chest pain for 3 days prior to the admission, nonradiating, occurred after using cocaine. He also reported feeling suicidal. In addition, patient was found to have occult blood positive stool with 2 gm drop in hemoglobin on the admission.   Assessment & Plan:   Principal Problem:   Occult blood positive stool / Acute blood loss anemia  - GI signed off, recommended follow up with [facility] (where pt follows regularly) - No further bleeding - Hgb stable   Active Problems:   Cirrhosis (HCC) / Hepatitis C - Continue Lactulose  - Ammonia level pending this am    ETOH abuse - Alcohol level 219 on admission - No reports of withdrawals     Thrombocytopenia (HCC) - Due to bone marrow suppression from cirrhosis - CBC pending this am    Atypical chest pain - ACS ruled out - No chest pain     Suicidal ideations - Awaiting inpatient psych placement    DVT prophylaxis: SCD's Code Status: full code  Family Communication: sitter at bedside, no family at the bedside  Disposition Plan: waiting psych placement    Consultants:   Psych   Procedures:   None   Antimicrobials:   None    Subjective: No overnight events.  Objective: Vitals:   07/31/17 2200 07/31/17 2238 08/01/17 0632 08/01/17 0817  BP: 110/65 110/61 (!) 100/54 (!) 101/54  Pulse: 80 70 67 65  Resp: 16 16 19 16   Temp: 98 F (36.7 C) 98.2 F (36.8 C) (!) 97.2 F (36.2 C) 97.7 F (36.5 C)  TempSrc: Oral Oral Oral Oral  SpO2: 96% 95% 97% 94%  Weight:      Height:        Intake/Output Summary (Last 24 hours) at 08/01/17 0821 Last data filed at  07/31/17 1700  Gross per 24 hour  Intake              960 ml  Output                0 ml  Net              960 ml   Filed Weights   07/27/17 0522 07/28/17 0450 07/29/17 0416  Weight: 81.9 kg (180 lb 9.6 oz) 82.6 kg (182 lb 3.2 oz) 82.1 kg (181 lb 1.5 oz)    Physical Exam  Constitutional: Appears well-developed and well-nourished. No distress.   CVS: RRR, S1/S2 +, no murmurs, no gallops, no carotid bruit.  Pulmonary: Effort and breath sounds normal, no stridor, rhonchi, wheezes, rales.  Abdominal: Soft. BS +,  no distension, tenderness, rebound or guarding.  Musculoskeletal: Normal range of motion. No edema and no tenderness.  Lymphadenopathy: No lymphadenopathy noted, cervical, inguinal. Neuro: Alert. Normal reflexes, muscle tone coordination. No cranial nerve deficit. Skin: Skin is warm and dry. No rash noted. Not diaphoretic. No erythema. No pallor.  Psychiatric: Normal mood and affect. Behavior, judgment, thought content normal.     Data Reviewed: I have personally reviewed following labs and imaging studies  CBC:  Recent Labs Lab 07/27/17 0222  WBC 3.5*  HGB 12.7*  HCT  38.0*  MCV 82.3  PLT 72*   Basic Metabolic Panel: No results for input(s): NA, K, CL, CO2, GLUCOSE, BUN, CREATININE, CALCIUM, MG, PHOS in the last 168 hours. GFR: Estimated Creatinine Clearance: 75.7 mL/min (by C-G formula based on SCr of 1.03 mg/dL). Liver Function Tests: No results for input(s): AST, ALT, ALKPHOS, BILITOT, PROT, ALBUMIN in the last 168 hours. No results for input(s): LIPASE, AMYLASE in the last 168 hours. No results for input(s): AMMONIA in the last 168 hours. Coagulation Profile: No results for input(s): INR, PROTIME in the last 168 hours. Cardiac Enzymes: No results for input(s): CKTOTAL, CKMB, CKMBINDEX, TROPONINI in the last 168 hours. BNP (last 3 results) No results for input(s): PROBNP in the last 8760 hours. HbA1C: No results for input(s): HGBA1C in the last 72  hours. CBG: No results for input(s): GLUCAP in the last 168 hours. Lipid Profile: No results for input(s): CHOL, HDL, LDLCALC, TRIG, CHOLHDL, LDLDIRECT in the last 72 hours. Thyroid Function Tests: No results for input(s): TSH, T4TOTAL, FREET4, T3FREE, THYROIDAB in the last 72 hours. Anemia Panel: No results for input(s): VITAMINB12, FOLATE, FERRITIN, TIBC, IRON, RETICCTPCT in the last 72 hours. Urine analysis:  Sepsis Labs: @LABRCNTIP (procalcitonin:4,lacticidven:4)   )No results found for this or any previous visit (from the past 240 hour(s)).    Radiology Studies: No results found.   Scheduled Meds: . folic acid  1 mg Oral Daily  . hydrOXYzine  25 mg Oral BID  . lactulose  10 g Oral TID  . lamoTRIgine  150 mg Oral Daily  . multivitamin with minerals  1 tablet Oral Daily  . nicotine  21 mg Transdermal Daily  . pantoprazole  40 mg Oral BID  . sucralfate  1 g Oral BID  . thiamine  100 mg Oral Daily  . traZODone  100 mg Oral QHS   Continuous Infusions:   LOS: 17 days    Time spent: 25 minutes  Greater than 50% of the time spent on counseling and coordinating the care.   Leisa Lenz, MD [facility] Pager [phone]  If 7PM-7AM, please contact night-coverage [email or web address] Password [email or web address] 08/01/2017, 8:21 AM

## 2017-08-01 NOTE — Progress Notes (Signed)
Attempted to call report to [phone]. Staff said they will call back.

## 2017-08-01 NOTE — Progress Notes (Signed)
Patient received from [place]. Joslyn Hy, MSN, RN, [employer]

## 2017-08-02 LAB — BASIC METABOLIC PANEL
ANION GAP: 3 — AB (ref 5–15)
BUN: 9 mg/dL (ref 6–20)
CALCIUM: 8.4 mg/dL — AB (ref 8.9–10.3)
CO2: 25 mmol/L (ref 22–32)
Chloride: 109 mmol/L (ref 101–111)
Creatinine, Ser: 1.05 mg/dL (ref 0.61–1.24)
GLUCOSE: 92 mg/dL (ref 65–99)
Potassium: 4 mmol/L (ref 3.5–5.1)
SODIUM: 137 mmol/L (ref 135–145)

## 2017-08-02 LAB — CBC
HCT: 37.5 % — ABNORMAL LOW (ref 39.0–52.0)
Hemoglobin: 12.7 g/dL — ABNORMAL LOW (ref 13.0–17.0)
MCH: 28 pg (ref 26.0–34.0)
MCHC: 33.9 g/dL (ref 30.0–36.0)
MCV: 82.6 fL (ref 78.0–100.0)
PLATELETS: 64 10*3/uL — AB (ref 150–400)
RBC: 4.54 MIL/uL (ref 4.22–5.81)
RDW: 16.9 % — AB (ref 11.5–15.5)
WBC: 3.2 10*3/uL — AB (ref 4.0–10.5)

## 2017-08-02 NOTE — Care Management Important Message (Signed)
Important Message  Patient Details  Name: Patrick [name] MRN: [id number] Date of Birth: 1950/12/04   Medicare Important Message Given:  Yes    [name] May 08/02/2017, 10:35 AM

## 2017-08-02 NOTE — Progress Notes (Signed)
Patient ID: Patrick [name], male   DOB: Feb 17, 1951, 66 y.o.   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 He presented to [facility] with chest pain for 3 days prior to the admission, nonradiating, occurred after using cocaine. He also reported feeling suicidal. In addition, patient was found to have occult blood positive stool with 2 gm drop in hemoglobin on the admission.   Assessment & Plan:   Principal Problem:   Occult blood positive stool - Seen by GI, signed off, recommended to follow up with [facility] on outpt basis  - No further bleeding - Hemoglobin stable   Active Problems:   Cirrhosis ([place]) / Hepatitis C - Continue Lactulose - Alert, oriented to time, place and person - Ammonia level 113 on 9/23    ETOH abuse - No reports of withdrawals     Thrombocytopenia (HCC) - Due to bone marrow suppression from liver cirrhosis - Platelets are stable     Atypical chest pain - ACS ruled out - No chest pain    Suicidal ideations - Awaiting  inpatient psych placement   DVT prophylaxis: SCD's Code Status: full code  Family Communication: no family at the bedside Disposition Plan: to inpatient psych once bed available    Consultants:   Psych  Gastroenterology, Dr. Therisa Doyne  Procedures:   None   Antimicrobials:   None    Subjective: No overnight events.  Objective: Vitals:   08/01/17 0817 08/01/17 1549 08/01/17 2128 08/02/17 0500  BP: (!) 101/54 (!) 115/53 (!) 115/57 98/60  Pulse: 65 70 78 66  Resp: 16 18 17    Temp: 97.7 F (36.5 C) 98.2 F (36.8 C)  97.8 F (36.6 C)  TempSrc: Oral Oral Oral Oral  SpO2: 94% 96% 94% 94%  Weight:      Height:        Intake/Output Summary (Last 24 hours) at 08/02/17 1006 Last data filed at  08/01/17 1830  Gross per 24 hour  Intake              840 ml  Output                0 ml  Net              840 ml   Filed Weights   07/27/17 0522 07/28/17 0450 07/29/17 0416  Weight: 81.9 kg (180 lb 9.6 oz) 82.6 kg (182 lb 3.2 oz) 82.1 kg (181 lb 1.5 oz)    Examination:  General exam: Appears calm and comfortable  Respiratory system: Clear to auscultation. Respiratory effort normal. Cardiovascular system: S1 & S2 heard, Rate controlled Gastrointestinal system: Abdomen is nondistended, soft and nontender. No organomegaly or masses felt. Normal bowel sounds heard. Central nervous system: Alert and oriented. No focal neurological deficits. Extremities: Symmetric 5 x 5 power. Skin: No rashes, lesions or ulcers Psychiatry: Judgement and insight appear normal. Mood & affect appropriate.   Data Reviewed: I have personally reviewed following labs and imaging studies  CBC:  Recent Labs Lab 07/27/17 0222 08/01/17 0641 08/02/17 0718  WBC 3.5* 2.9* 3.2*  HGB 12.7* 12.3* 12.7*  HCT 38.0* 37.3* 37.5*  MCV 82.3 83.1 82.6  PLT 72* 59* 64*   Basic Metabolic Panel:  Recent Labs Lab 08/01/17 0641 08/02/17 5809  NA 139 137  K 3.6 4.0  CL 112* 109  CO2 21* 25  GLUCOSE 91 92  BUN 8 9  CREATININE 1.04 1.05  CALCIUM 8.7* 8.4*   GFR: Estimated Creatinine Clearance: 74.3 mL/min (by C-G formula based on SCr of 1.05 mg/dL). Liver Function Tests: No results for input(s): AST, ALT, ALKPHOS, BILITOT, PROT, ALBUMIN in the last 168 hours. No results for input(s): LIPASE, AMYLASE in the last 168 hours.  Recent Labs Lab 08/01/17 0641  AMMONIA 113*   Coagulation Profile: No results for input(s): INR, PROTIME in the last 168 hours. Cardiac Enzymes: No results for input(s): CKTOTAL, CKMB, CKMBINDEX, TROPONINI in the last 168 hours. BNP (last 3 results) No results for input(s): PROBNP in the last 8760 hours. HbA1C: No results for input(s): HGBA1C in the last 72 hours. CBG: No results  for input(s): GLUCAP in the last 168 hours. Lipid Profile: No results for input(s): CHOL, HDL, LDLCALC, TRIG, CHOLHDL, LDLDIRECT in the last 72 hours. Thyroid Function Tests: No results for input(s): TSH, T4TOTAL, FREET4, T3FREE, THYROIDAB in the last 72 hours. Anemia Panel: No results for input(s): VITAMINB12, FOLATE, FERRITIN, TIBC, IRON, RETICCTPCT in the last 72 hours. Urine analysis:    Component Value Date/Time   COLORURINE YELLOW 03/11/2011 0141   APPEARANCEUR CLEAR 03/11/2011 0141   LABSPEC <1.005 (L) 03/11/2011 0141   PHURINE 6.0 03/11/2011 0141   GLUCOSEU NEGATIVE 03/11/2011 0141   HGBUR MODERATE (A) 03/11/2011 0141   BILIRUBINUR NEGATIVE 03/11/2011 0141   KETONESUR NEGATIVE 03/11/2011 0141   PROTEINUR NEGATIVE 03/11/2011 0141   UROBILINOGEN 0.2 03/11/2011 0141   NITRITE NEGATIVE 03/11/2011 0141   LEUKOCYTESUR NEGATIVE 03/11/2011 0141   Sepsis Labs: @LABRCNTIP (procalcitonin:4,lacticidven:4)   )No results found for this or any previous visit (from the past 240 hour(s)).    Radiology Studies: No results found.      Scheduled Meds: . folic acid  1 mg Oral Daily  . hydrOXYzine  25 mg Oral BID  . lactulose  10 g Oral TID  . lamoTRIgine  150 mg Oral Daily  . multivitamin with minerals  1 tablet Oral Daily  . nicotine  21 mg Transdermal Daily  . pantoprazole  40 mg Oral BID  . sucralfate  1 g Oral BID  . thiamine  100 mg Oral Daily  . traZODone  100 mg Oral QHS   Continuous Infusions:   LOS: 18 days    Time spent: 25 minutes  Greater than 50% of the time spent on counseling and coordinating the care.   Leisa Lenz, MD [facility] Pager [phone]  If 7PM-7AM, please contact night-coverage [email or web address] Password [email or web address] 08/02/2017, 10:06 AM

## 2017-08-03 DIAGNOSIS — F3131 Bipolar disorder, current episode depressed, mild: Secondary | ICD-10-CM | POA: Diagnosis present

## 2017-08-03 MED ORDER — FOLIC ACID 1 MG PO TABS: 1.0000 mg | ORAL_TABLET | Freq: Every day | ORAL | 0 refills | Status: AC

## 2017-08-03 MED ORDER — SUCRALFATE 1 GM/10ML PO SUSP: 1.0000 g | Freq: Two times a day (BID) | ORAL | 0 refills | Status: DC

## 2017-08-03 MED ORDER — PANTOPRAZOLE SODIUM 40 MG PO TBEC: 40.0000 mg | DELAYED_RELEASE_TABLET | Freq: Two times a day (BID) | ORAL | 2 refills | Status: DC

## 2017-08-03 MED ORDER — NICOTINE 21 MG/24HR TD PT24: 21.0000 mg | MEDICATED_PATCH | Freq: Every day | TRANSDERMAL | 0 refills | Status: DC

## 2017-08-03 MED ORDER — LAMOTRIGINE 100 MG PO TABS: 250.0000 mg | ORAL_TABLET | Freq: Every day | ORAL | 0 refills | Status: DC

## 2017-08-03 MED ORDER — TRAZODONE HCL 100 MG PO TABS: 100.0000 mg | ORAL_TABLET | Freq: Every day | ORAL | 2 refills | Status: DC

## 2017-08-03 MED ORDER — THIAMINE HCL 100 MG PO TABS: 100.0000 mg | ORAL_TABLET | Freq: Every day | ORAL | 0 refills | Status: DC

## 2017-08-03 MED ORDER — LACTULOSE 10 GM/15ML PO SOLN: 10.0000 g | Freq: Three times a day (TID) | ORAL | 0 refills | Status: AC

## 2017-08-03 MED ORDER — LAMOTRIGINE 200 MG PO TABS: 200.0000 mg | ORAL_TABLET | Freq: Every day | ORAL | 0 refills | Status: DC

## 2017-08-03 NOTE — Progress Notes (Addendum)
CSW provided patient with homeless resources and a list of [facility]. Patient asked CSW to contact [facility] in [place]. CSW spoke with admissions. Patient completed their program in 2015. They do not have any available beds until next month, but will fax CSW the referral packet to get patient started. If accepted, patient would be able to complete their 4-5 month program, with a room and board cost of $450/month. They would provide him with a greyhound bus ticket as well.   CSW provided bus passes and directions to shelters.  CSW signing off.   Patrick [name] LCSWA [phone]

## 2017-08-03 NOTE — Care Management Note (Signed)
Case Management Note  Patient Details  Name: LEVERNE AMRHEIN MRN: [id number] Date of Birth: January 26, 1951  Subjective/Objective:           Pt presented for Esophageal and Gastric Varices. Pt has hx of ETOH/ Cocaine Abuse. Pt is being followed by Psych 2/2 suicidal ideations. Psych did recommend Inpatient Psych Facility.                           Action/Plan: Awaiting inpatient psych bed. CSW managing disposition to facility. CM will continue to monitor.  Expected Discharge Date:                  Expected Discharge Plan:    In-House Referral:  Clinical Social Work  Discharge planning Services  CM Consult  Post Acute Care Choice:   Choice offered to:  NA  DME Arranged:  N/A DME Agency:  NA  HH Arranged:  NA HH Agency:  NA  Status of Service:  Completed, signed off  If discussed at [place] of Stay Meetings,  dates discussed:  9/25,  Additional Comments:  Sharin Mons, RN 08/03/2017, 9:06 AM

## 2017-08-03 NOTE — Consult Note (Signed)
[facility] Psychiatry Consult   Reason for Consult:  Depression  Referring Physician:  EDP Patient Identification: Patrick [name] MRN:  [id number] Principal Diagnosis: Major depressive disorder, recurrent, mild Diagnosis:   Patient Active Problem List   Diagnosis Date Noted  . Evaluation by medical service required [Z00.00]   . Suicidal ideations [R45.851] 07/15/2017  . Alcohol-induced mood disorder ([place]) [F10.94] 07/14/2017  . Occult blood positive stool [R19.5] 07/14/2017  . ETOH abuse [F10.10] 07/14/2017  . Thrombocytopenia ([place]) [D69.6] 07/14/2017  . Atypical chest pain [R07.89] 07/14/2017  . COPD (chronic obstructive pulmonary disease) ([place]) [J44.9] 07/08/2017  . Hepatic encephalopathy ([place]) [K72.90] 01/05/2015  . Bipolar 1 disorder ([place]) [F31.9] 01/05/2015  . Insomnia [G47.00] 01/05/2015  . Anemia, chronic disease [D63.8] 01/05/2015  . Neuropathy [G62.9] 01/05/2015  . Cirrhosis ([place]) [K74.60] 01/05/2015  . History of stroke [Z86.73] 01/05/2015  . Altered mental status [R41.82]     Total Time spent with patient: 45 minutes  Subjective:   Patrick [name] is a 66 y.o. male patient does not warrant psychiatric admission.  Dr Dwyane Dee has reviewed this patient and concurs with the findings.  HPI:  66 yo male who was admitted to the hospital with cirrhosis complications.  He expressed depression but denies suicidal/homicidal ideations, past suicide attempts, hallucinations, or withdrawal symptoms.  Patrick [name] had been living in the [facility] (half way house) prior to admission but was evicted when he started drinking again after 26 months of sobriety.  Prior to admission, he was drinking and using cocaine but has now been clean for the twenty days he has been in the hospital.  He is currently taking Lexapro, Lamictal, and Trazodone but the Lamictal "stopped working".  [name] goes to [facility] for his psychiatric care.  He is interested in trying something new for his bipolar  II.    Past Psychiatric History: substance abuse, bipolar disorder  Risk to Self: None Risk to Others: Homicidal Ideation: No Thoughts of Harm to Others: No Current Homicidal Intent: No Current Homicidal Plan: No Access to Homicidal Means: No Identified Victim: No one History of harm to others?: No Assessment of Violence: None Noted Violent Behavior Description: None reported Does patient have access to weapons?: No Criminal Charges Pending?: No Does patient have a court date: No Prior Inpatient Therapy: Prior Inpatient Therapy: Yes Prior Therapy Dates: About a year ago. Prior Therapy Facilty/Provider(s): [facility]; [facility],  Reason for Treatment: SI Prior Outpatient Therapy: Prior Outpatient Therapy: Yes Prior Therapy Dates: Current Prior Therapy Facilty/Provider(s): [facility] Reason for Treatment: med management Does patient have an ACCT team?: No Does patient have Intensive In-House Services?  : No Does patient have [facility] services? : No Does patient have P4CC services?: No  Past Medical History:  Past Medical History:  Diagnosis Date  . Anxiety   . Bipolar 1 disorder ([place])   . Cirrhosis ([place])   . COPD (chronic obstructive pulmonary disease) ([place])   . Depression   . Heart murmur   . Hepatitis C   . Oxygen deficiency   . Polysubstance abuse   . Stroke [facility])     Past Surgical History:  Procedure Laterality Date  . arm surgery     Family History:  Family History  Problem Relation Age of Onset  . Arthritis Mother   . Cancer Father        lung  . Diabetes Father   . Mental illness Brother        depression  .  Heart disease Paternal Grandmother   . Cancer Paternal Grandmother        skin  . Diabetes Paternal Uncle    Family Psychiatric  History: unknown Social History:  History  Alcohol Use  . Yes    Comment: 26 months alcohol free- relapse on 07/10/17     History  Drug Use No    Social History   Social History  . Marital status: Divorced     Spouse name: N/A  . Number of children: N/A  . Years of education: N/A   Social History Main Topics  . Smoking status: Current Every Day Smoker    Packs/day: 0.25    Years: 50.00    Types: Cigarettes  . Smokeless tobacco: Never Used  . Alcohol use Yes     Comment: 26 months alcohol free- relapse on 07/10/17  . Drug use: No  . Sexual activity: No   Other Topics Concern  . None   Social History Narrative  . None   Additional Social History:    Allergies:   Allergies  Allergen Reactions  . Shellfish Allergy Hives  . Tramadol Itching    Labs:  Results for orders placed or performed during the hospital encounter of 07/14/17 (from the past 48 hour(s))  CBC     Status: Abnormal   Collection Time: 08/02/17  7:18 AM  Result Value Ref Range   WBC 3.2 (L) 4.0 - 10.5 K/uL   RBC 4.54 4.22 - 5.81 MIL/uL   Hemoglobin 12.7 (L) 13.0 - 17.0 g/dL   HCT 37.5 (L) 39.0 - 52.0 %   MCV 82.6 78.0 - 100.0 fL   MCH 28.0 26.0 - 34.0 pg   MCHC 33.9 30.0 - 36.0 g/dL   RDW 16.9 (H) 11.5 - 15.5 %   Platelets 64 (L) 150 - 400 K/uL  Basic metabolic panel     Status: Abnormal   Collection Time: 08/02/17  7:18 AM  Result Value Ref Range   Sodium 137 135 - 145 mmol/L   Potassium 4.0 3.5 - 5.1 mmol/L   Chloride 109 101 - 111 mmol/L   CO2 25 22 - 32 mmol/L   Glucose, Bld 92 65 - 99 mg/dL   BUN 9 6 - 20 mg/dL   Creatinine, Ser 1.05 0.61 - 1.24 mg/dL   Calcium 8.4 (L) 8.9 - 10.3 mg/dL   GFR calc non Af Amer >60 >60 mL/min   GFR calc Af Amer >60 >60 mL/min    Comment: (NOTE) The eGFR has been calculated using the CKD EPI equation. This calculation has not been validated in all clinical situations. eGFR's persistently <60 mL/min signify possible Chronic Kidney Disease.    Anion gap 3 (L) 5 - 15    Current Facility-Administered Medications  Medication Dose Route Frequency Provider Last Rate Last Dose  . acetaminophen (TYLENOL) tablet 650 mg  650 mg Oral Q6H PRN Etta Quill, DO   650  mg at 07/23/17 2243   Or  . acetaminophen (TYLENOL) suppository 650 mg  650 mg Rectal Q6H PRN Etta Quill, DO      . folic acid (FOLVITE) tablet 1 mg  1 mg Oral Daily Jennette Kettle M, DO   1 mg at 08/02/17 1055  . gi cocktail (Maalox,Lidocaine,Donnatal)  30 mL Oral TID PRN Barton Dubois, MD   30 mL at 07/17/17 1102  . hydrOXYzine (ATARAX/VISTARIL) tablet 25 mg  25 mg Oral BID Jennette Kettle M, DO   25 mg at  08/02/17 2223  . lactulose (CHRONULAC) 10 GM/15ML solution 10 g  10 g Oral TID Modena Jansky, MD   10 g at 08/02/17 2223  . lamoTRIgine (LAMICTAL) tablet 150 mg  150 mg Oral Daily Jennette Kettle M, DO   150 mg at 08/02/17 1055  . methocarbamol (ROBAXIN) tablet 500 mg  500 mg Oral Q12H PRN Barton Dubois, MD   500 mg at 07/23/17 0022  . multivitamin with minerals tablet 1 tablet  1 tablet Oral Daily Jennette Kettle M, DO   1 tablet at 08/02/17 1056  . nicotine (NICODERM CQ - dosed in mg/24 hours) patch 21 mg  21 mg Transdermal Daily Muthersbaugh, Hannah, PA-C      . nitroGLYCERIN (NITROSTAT) SL tablet 0.4 mg  0.4 mg Sublingual Q5 min PRN Eulogio Bear U, DO   0.4 mg at 07/15/17 1236  . ondansetron (ZOFRAN) tablet 4 mg  4 mg Oral Q6H PRN Etta Quill, DO       Or  . ondansetron [facility]) injection 4 mg  4 mg Intravenous Q6H PRN Etta Quill, DO      . pantoprazole (PROTONIX) EC tablet 40 mg  40 mg Oral BID Brahmbhatt, Parag, MD   40 mg at 08/02/17 2223  . sucralfate (CARAFATE) 1 GM/10ML suspension 1 g  1 g Oral BID Barton Dubois, MD   1 g at 08/02/17 2223  . thiamine (VITAMIN B-1) tablet 100 mg  100 mg Oral Daily Jennette Kettle M, DO   100 mg at 08/02/17 1056  . traZODone (DESYREL) tablet 100 mg  100 mg Oral QHS Jennette Kettle M, DO   100 mg at 08/02/17 2223    Musculoskeletal: Strength & Muscle Tone: within normal limits Gait & Station: normal Patient leans: N/A  Psychiatric Specialty Exam: Physical Exam  Constitutional: He is oriented to person, place, and time. He  appears well-developed and well-nourished.  HENT:  Head: Normocephalic.  Neck: Normal range of motion.  Respiratory: Effort normal.  Musculoskeletal: Normal range of motion.  Neurological: He is alert and oriented to person, place, and time.  Psychiatric: His speech is normal and behavior is normal. Judgment and thought content normal. Cognition and memory are normal. He exhibits a depressed mood.    Review of Systems  Psychiatric/Behavioral: Positive for depression and substance abuse.  All other systems reviewed and are negative.   Blood pressure (!) 99/56, pulse 66, temperature 97.6 F (36.4 C), temperature source Oral, resp. rate 20, height '5\' 8"'$  (1.727 m), weight 82.1 kg (181 lb 1.5 oz), SpO2 91 %.Body mass index is 27.54 kg/m.  General Appearance: Casual  Eye Contact:  Good  Speech:  Normal Rate  Volume:  Normal  Mood:  Depressed  Affect:  Congruent  Thought Process:  Coherent and Descriptions of Associations: Intact  Orientation:  Full (Time, Place, and Person)  Thought Content:  WDL and Logical  Suicidal Thoughts:  No  Homicidal Thoughts:  No  Memory:  Immediate;   Good Recent;   Good Remote;   Good  Judgement:  Fair  Insight:  Fair  Psychomotor Activity:  Normal  Concentration:  Concentration: Good and Attention Span: Good  Recall:  Good  Fund of Knowledge:  Fair  Language:  Good  Akathisia:  No  Handed:  Right  AIMS (if indicated):     Assets:  Leisure Time Physical Health Resilience  ADL's:  Intact  Cognition:  WNL  Sleep:        Treatment Plan  Summary: Daily contact with patient to assess and evaluate symptoms and progress in treatment, Medication management and Plan bipolar affective disorder, depressed, mild:  Recommend restarting his Lexapro at 5 mg daily for depression, increasing his Lamictal 150 mg daily for mood stabilization to 200 mg (may increase 50 mg weekly until stable), and continuing his Trazodone 100 mg at bedtime for sleep.  Another half  way house would be ideal for him at discharge.  Patient reviewed by Dr Dwyane Dee and concurs with the findings.  Disposition: No evidence of imminent risk to self or others at present.    Waylan Boga, NP 08/03/2017 7:29 AM

## 2017-08-03 NOTE — Discharge Summary (Signed)
Physician Discharge Summary  Patrick [name] QPY:[id number] DOB: 04/30/1951 DOA: 07/14/2017  PCP: Dettinger, Fransisca Kaufmann, MD  Admit date: 07/14/2017 Discharge date: 08/03/2017  Time spent: 25 minutes  Recommendations for Outpatient Follow-up:  1. Patient will need follow-up at a halfway house 2. I prescribed all of the medications as per psychiatrist input and he will need follow-up 3. Patient very high risk for readmission  Discharge Diagnoses:  Active Problems:   Cirrhosis ([place])   Occult blood positive stool   ETOH abuse   Thrombocytopenia (HCC)   Atypical chest pain   Suicidal ideations   Evaluation by medical service required   Bipolar affective disorder, depressed, mild ([name])   Discharge Condition: guarded  Diet recommendation: heart healthy low-salt  Filed Weights   07/27/17 0522 07/28/17 0450 07/29/17 0416  Weight: 81.9 kg (180 lb 9.6 oz) 82.6 kg (182 lb 3.2 oz) 82.1 kg (181 lb 1.5 oz)    History of present illness:  66-year-old known alcohol use, cocaine abuse, 6 hepatitis C cirrhosis who presented to the [facility] emergency room 9/5 with chest pain which is nonradiating and also was found to have guaiac positive stool He was admitted  Hospital Course:   Occult blood positive stool - Seen by GI, signed off, recommended to follow up with [facility] on outpt basis  - No further bleeding - Hemoglobin stable   Active Problems:   Cirrhosis ([name]) / Hepatitis C - Continue Lactuloseand given a prescription on discharge -If he can demonstrate abstinence he may be a candidate for discussion of advanced therapy for hepatitis C Tab this is of course contingent on him quitting drinking    ETOH abuse - No reports of withdrawals     Thrombocytopenia ([name]) - Due to bone marrow suppression from liver cirrhosis - Platelets are stable  -he should not be on aspirin going forward    Atypical chest pain - ACS ruled out - No chest pain    Suicidal ideations - psychiatry saw  the patient on 9/25 and cleared him for discharge -He will be given aset of places that he can go to follow-up for care  Discharge Exam: Vitals:   08/02/17 1918 08/03/17 0531  BP: (!) 121/56 (!) 99/56  Pulse: 70 66  Resp: 19 20  Temp: 98.1 F (36.7 C) 97.6 F (36.4 C)  SpO2: 94% 91%    General: EOMI NCAT Cardiovascular: S1-S2 no murmur rub or gallop Respiratory: clinically clear  Discharge Instructions    Current Discharge Medication List    START taking these medications   Details  folic acid (FOLVITE) 1 MG tablet Take 1 tablet (1 mg total) by mouth daily. Qty: 30 tablet, Refills: 0    lactulose (CHRONULAC) 10 GM/15ML solution Take 15 mLs (10 g total) by mouth 3 (three) times daily. Qty: 240 mL, Refills: 0    nicotine (NICODERM CQ - DOSED IN MG/24 HOURS) 21 mg/24hr patch Place 1 patch (21 mg total) onto the skin daily. Qty: 28 patch, Refills: 0    pantoprazole (PROTONIX) 40 MG tablet Take 1 tablet (40 mg total) by mouth 2 (two) times daily. Qty: 60 tablet, Refills: 2    sucralfate (CARAFATE) 1 GM/10ML suspension Take 10 mLs (1 g total) by mouth 2 (two) times daily. Qty: 420 mL, Refills: 0    thiamine 100 MG tablet Take 1 tablet (100 mg total) by mouth daily. Qty: 30 tablet, Refills: 0      CONTINUE these medications which have CHANGED  Details  !! lamoTRIgine (LAMICTAL) 100 MG tablet Take 2.5 tablets (250 mg total) by mouth daily. Qty: 17 tablet, Refills: 0    !! lamoTRIgine (LAMICTAL) 200 MG tablet Take 1 tablet (200 mg total) by mouth daily. Qty: 7 tablet, Refills: 0    traZODone (DESYREL) 100 MG tablet Take 1 tablet (100 mg total) by mouth at bedtime. Qty: 30 tablet, Refills: 2     !! - Potential duplicate medications found. Please discuss with provider.    STOP taking these medications     hydrOXYzine (ATARAX/VISTARIL) 25 MG tablet      ibuprofen (ADVIL,MOTRIN) 200 MG tablet        Allergies  Allergen Reactions  . Shellfish Allergy Hives  .  Tramadol Itching      The results of significant diagnostics from this hospitalization (including imaging, microbiology, ancillary and laboratory) are listed below for reference.    Significant Diagnostic Studies: Dg Chest 2 View  Result Date: 07/14/2017 CLINICAL DATA:  Chest pain. EXAM: CHEST  2 VIEW COMPARISON:  Radiographs of Mar 22, 2017. FINDINGS: The heart size and mediastinal contours are within normal limits. Both lungs are clear. No pneumothorax or pleural effusion is noted. The visualized skeletal structures are unremarkable. IMPRESSION: No active cardiopulmonary disease. Electronically Signed   By: Marijo Conception, M.D.   On: 07/14/2017 19:45   Dg Lumbar Spine 2-3 Views  Result Date: 07/08/2017 CLINICAL DATA:  Fall (on) (from) other stairs and steps, initial encounter W10.8XXA (ICD-10-CM) Acute midline low back pain without sciatica M54.5 (ICD-10-CM) EXAM: LUMBAR SPINE - 2-3 VIEW COMPARISON:  12/09/2016 FINDINGS: Mild loss of the L2 vertebral body height, stable from the prior exam. No acute fracture. No spondylolisthesis. There are endplate osteophytes at all lumbar levels. The disc spaces are relatively well maintained. Mild levoscoliosis, apex at L2, stable. Soft tissues are unremarkable. IMPRESSION: 1. No acute fracture or acute finding. No spondylolisthesis. Stable appearance from the prior study. Electronically Signed   By: [name] M.D.   On: 07/08/2017 13:04    Microbiology: No results found for this or any previous visit (from the past 240 hour(s)).   Labs: Basic Metabolic Panel:  Recent Labs Lab 08/01/17 0641 08/02/17 0718  NA 139 137  K 3.6 4.0  CL 112* 109  CO2 21* 25  GLUCOSE 91 92  BUN 8 9  CREATININE 1.04 1.05  CALCIUM 8.7* 8.4*   Liver Function Tests: No results for input(s): AST, ALT, ALKPHOS, BILITOT, PROT, ALBUMIN in the last 168 hours. No results for input(s): LIPASE, AMYLASE in the last 168 hours.  Recent Labs Lab 08/01/17 0641  AMMONIA  113*   CBC:  Recent Labs Lab 08/01/17 0641 08/02/17 0718  WBC 2.9* 3.2*  HGB 12.3* 12.7*  HCT 37.3* 37.5*  MCV 83.1 82.6  PLT 59* 64*   Cardiac Enzymes: No results for input(s): CKTOTAL, CKMB, CKMBINDEX, TROPONINI in the last 168 hours. BNP: BNP (last 3 results) No results for input(s): BNP in the last 8760 hours.  ProBNP (last 3 results) No results for input(s): PROBNP in the last 8760 hours.  CBG: No results for input(s): GLUCAP in the last 168 hours.     SignedNita Sells MD   [facility] 08/03/2017, 12:59 PM

## 2017-08-04 MED FILL — lamoTRIgine 200 MG TABS: 200 | 7 days supply | Qty: 7 | Fill #0

## 2017-08-04 MED FILL — traZODone HCL 100 MG TABS: 100 | 30 days supply | Qty: 30 | Fill #0

## 2017-08-04 MED FILL — FOLIC ACID 1 MG TABLET: 1 | 30 days supply | Qty: 30 | Fill #0

## 2017-08-04 MED FILL — CARAFATE 1 GM/10 ML SUSP: 1 | 21 days supply | Qty: 420 | Fill #0

## 2017-08-04 MED FILL — LACTULOSE 10 GM/15 ML SOLN: 10 | 6 days supply | Qty: 240 | Fill #0

## 2017-08-04 MED FILL — lamoTRIgine 100 MG TABS: 100 | 7 days supply | Qty: 17 | Fill #0

## 2017-08-04 MED FILL — ?PANTOPRAZOLE SOD DR 40MG: 40 MG | 30 days supply | Qty: 60 | Fill #0

## 2017-08-09 ENCOUNTER — Encounter: Payer: Self-pay | Admitting: Hematology

## 2017-09-13 ENCOUNTER — Emergency Department (HOSPITAL_COMMUNITY): Payer: Medicare Other

## 2017-09-13 ENCOUNTER — Inpatient Hospital Stay (HOSPITAL_COMMUNITY)
Admission: EM | Admit: 2017-09-13 | Discharge: 2017-09-16 | DRG: 435 | Disposition: A | Discharge status: Home / Self Care | Payer: Medicare Other | Attending: Family Medicine | Admitting: Family Medicine

## 2017-09-13 ENCOUNTER — Other Ambulatory Visit: Payer: Self-pay

## 2017-09-13 ENCOUNTER — Encounter (HOSPITAL_COMMUNITY): Payer: Self-pay | Admitting: Radiology

## 2017-09-13 DIAGNOSIS — Z885 Allergy status to narcotic agent status: Secondary | ICD-10-CM

## 2017-09-13 DIAGNOSIS — E8809 Other disorders of plasma-protein metabolism, not elsewhere classified: Secondary | ICD-10-CM | POA: Diagnosis present

## 2017-09-13 DIAGNOSIS — R160 Hepatomegaly, not elsewhere classified: Secondary | ICD-10-CM | POA: Diagnosis present

## 2017-09-13 DIAGNOSIS — F17210 Nicotine dependence, cigarettes, uncomplicated: Secondary | ICD-10-CM | POA: Diagnosis present

## 2017-09-13 DIAGNOSIS — F1020 Alcohol dependence, uncomplicated: Secondary | ICD-10-CM | POA: Diagnosis not present

## 2017-09-13 DIAGNOSIS — I700 Atherosclerosis of aorta: Secondary | ICD-10-CM | POA: Diagnosis not present

## 2017-09-13 DIAGNOSIS — K766 Portal hypertension: Secondary | ICD-10-CM | POA: Diagnosis present

## 2017-09-13 DIAGNOSIS — R011 Cardiac murmur, unspecified: Secondary | ICD-10-CM | POA: Diagnosis present

## 2017-09-13 DIAGNOSIS — D649 Anemia, unspecified: Secondary | ICD-10-CM | POA: Diagnosis present

## 2017-09-13 DIAGNOSIS — Z8619 Personal history of other infectious and parasitic diseases: Secondary | ICD-10-CM

## 2017-09-13 DIAGNOSIS — E872 Acidosis: Secondary | ICD-10-CM | POA: Diagnosis present

## 2017-09-13 DIAGNOSIS — R509 Fever, unspecified: Secondary | ICD-10-CM

## 2017-09-13 DIAGNOSIS — B1920 Unspecified viral hepatitis C without hepatic coma: Secondary | ICD-10-CM | POA: Diagnosis present

## 2017-09-13 DIAGNOSIS — E860 Dehydration: Secondary | ICD-10-CM | POA: Diagnosis present

## 2017-09-13 DIAGNOSIS — M549 Dorsalgia, unspecified: Secondary | ICD-10-CM | POA: Diagnosis present

## 2017-09-13 DIAGNOSIS — J449 Chronic obstructive pulmonary disease, unspecified: Secondary | ICD-10-CM | POA: Diagnosis present

## 2017-09-13 DIAGNOSIS — J069 Acute upper respiratory infection, unspecified: Secondary | ICD-10-CM | POA: Diagnosis present

## 2017-09-13 DIAGNOSIS — K7031 Alcoholic cirrhosis of liver with ascites: Secondary | ICD-10-CM | POA: Diagnosis present

## 2017-09-13 DIAGNOSIS — R059 Cough, unspecified: Secondary | ICD-10-CM | POA: Diagnosis present

## 2017-09-13 DIAGNOSIS — Z79899 Other long term (current) drug therapy: Secondary | ICD-10-CM

## 2017-09-13 DIAGNOSIS — R162 Hepatomegaly with splenomegaly, not elsewhere classified: Secondary | ICD-10-CM | POA: Diagnosis present

## 2017-09-13 DIAGNOSIS — F319 Bipolar disorder, unspecified: Secondary | ICD-10-CM | POA: Diagnosis present

## 2017-09-13 DIAGNOSIS — F1024 Alcohol dependence with alcohol-induced mood disorder: Secondary | ICD-10-CM | POA: Diagnosis present

## 2017-09-13 DIAGNOSIS — E8770 Fluid overload, unspecified: Secondary | ICD-10-CM | POA: Diagnosis present

## 2017-09-13 DIAGNOSIS — R109 Unspecified abdominal pain: Secondary | ICD-10-CM | POA: Diagnosis present

## 2017-09-13 DIAGNOSIS — Z23 Encounter for immunization: Secondary | ICD-10-CM | POA: Diagnosis present

## 2017-09-13 DIAGNOSIS — R05 Cough: Secondary | ICD-10-CM | POA: Diagnosis present

## 2017-09-13 DIAGNOSIS — D61818 Other pancytopenia: Secondary | ICD-10-CM | POA: Diagnosis present

## 2017-09-13 DIAGNOSIS — K7469 Other cirrhosis of liver: Secondary | ICD-10-CM

## 2017-09-13 DIAGNOSIS — F419 Anxiety disorder, unspecified: Secondary | ICD-10-CM | POA: Diagnosis present

## 2017-09-13 DIAGNOSIS — M546 Pain in thoracic spine: Secondary | ICD-10-CM | POA: Diagnosis not present

## 2017-09-13 DIAGNOSIS — K7030 Alcoholic cirrhosis of liver without ascites: Secondary | ICD-10-CM | POA: Diagnosis not present

## 2017-09-13 DIAGNOSIS — Z8673 Personal history of transient ischemic attack (TIA), and cerebral infarction without residual deficits: Secondary | ICD-10-CM

## 2017-09-13 DIAGNOSIS — D696 Thrombocytopenia, unspecified: Secondary | ICD-10-CM | POA: Diagnosis present

## 2017-09-13 DIAGNOSIS — C220 Liver cell carcinoma: Principal | ICD-10-CM | POA: Diagnosis present

## 2017-09-13 DIAGNOSIS — N179 Acute kidney failure, unspecified: Secondary | ICD-10-CM | POA: Diagnosis present

## 2017-09-13 DIAGNOSIS — I81 Portal vein thrombosis: Secondary | ICD-10-CM | POA: Diagnosis present

## 2017-09-13 DIAGNOSIS — F3131 Bipolar disorder, current episode depressed, mild: Secondary | ICD-10-CM | POA: Diagnosis present

## 2017-09-13 DIAGNOSIS — K7460 Unspecified cirrhosis of liver: Secondary | ICD-10-CM | POA: Diagnosis present

## 2017-09-13 DIAGNOSIS — Z91013 Allergy to seafood: Secondary | ICD-10-CM | POA: Diagnosis not present

## 2017-09-13 DIAGNOSIS — R1011 Right upper quadrant pain: Secondary | ICD-10-CM | POA: Diagnosis not present

## 2017-09-13 DIAGNOSIS — A419 Sepsis, unspecified organism: Secondary | ICD-10-CM | POA: Diagnosis not present

## 2017-09-13 DIAGNOSIS — J439 Emphysema, unspecified: Secondary | ICD-10-CM | POA: Diagnosis not present

## 2017-09-13 DIAGNOSIS — R55 Syncope and collapse: Secondary | ICD-10-CM | POA: Diagnosis not present

## 2017-09-13 LAB — CBC WITH DIFFERENTIAL/PLATELET
BASOS ABS: 0 10*3/uL (ref 0.0–0.1)
BASOS PCT: 0 %
EOS ABS: 0.1 10*3/uL (ref 0.0–0.7)
EOS PCT: 3 %
HCT: 36.9 % — ABNORMAL LOW (ref 39.0–52.0)
Hemoglobin: 12.6 g/dL — ABNORMAL LOW (ref 13.0–17.0)
LYMPHS PCT: 23 %
Lymphs Abs: 0.9 10*3/uL (ref 0.7–4.0)
MCH: 28.7 pg (ref 26.0–34.0)
MCHC: 34.1 g/dL (ref 30.0–36.0)
MCV: 84.1 fL (ref 78.0–100.0)
MONO ABS: 0.5 10*3/uL (ref 0.1–1.0)
Monocytes Relative: 14 %
Neutro Abs: 2.3 10*3/uL (ref 1.7–7.7)
Neutrophils Relative %: 61 %
PLATELETS: 84 10*3/uL — AB (ref 150–400)
RBC: 4.39 MIL/uL (ref 4.22–5.81)
RDW: 19.4 % — AB (ref 11.5–15.5)
WBC: 3.8 10*3/uL — AB (ref 4.0–10.5)

## 2017-09-13 LAB — URINALYSIS, ROUTINE W REFLEX MICROSCOPIC
BILIRUBIN URINE: NEGATIVE
Glucose, UA: NEGATIVE mg/dL
HGB URINE DIPSTICK: NEGATIVE
KETONES UR: NEGATIVE mg/dL
Leukocytes, UA: NEGATIVE
Nitrite: NEGATIVE
Protein, ur: NEGATIVE mg/dL
SPECIFIC GRAVITY, URINE: 1.016 (ref 1.005–1.030)
pH: 6 (ref 5.0–8.0)

## 2017-09-13 LAB — MAGNESIUM: MAGNESIUM: 1.9 mg/dL (ref 1.7–2.4)

## 2017-09-13 LAB — PROTIME-INR
INR: 1.25
PROTHROMBIN TIME: 15.6 s — AB (ref 11.4–15.2)

## 2017-09-13 LAB — I-STAT CG4 LACTIC ACID, ED
LACTIC ACID, VENOUS: 1.6 mmol/L (ref 0.5–1.9)
LACTIC ACID, VENOUS: 0.9 mmol/L (ref 0.5–1.9)
LACTIC ACID, VENOUS: 1.06 mmol/L (ref 0.5–1.9)
Lactic Acid, Venous: 2.41 mmol/L (ref 0.5–1.9)

## 2017-09-13 LAB — COMPREHENSIVE METABOLIC PANEL
ALK PHOS: 216 U/L — AB (ref 38–126)
ALT: 33 U/L (ref 17–63)
ANION GAP: 5 (ref 5–15)
AST: 76 U/L — ABNORMAL HIGH (ref 15–41)
Albumin: 2.9 g/dL — ABNORMAL LOW (ref 3.5–5.0)
BUN: 9 mg/dL (ref 6–20)
CALCIUM: 8.4 mg/dL — AB (ref 8.9–10.3)
CO2: 23 mmol/L (ref 22–32)
CREATININE: 1.26 mg/dL — AB (ref 0.61–1.24)
Chloride: 110 mmol/L (ref 101–111)
GFR, EST NON AFRICAN AMERICAN: 59 mL/min — AB (ref >60)
Glucose, Bld: 131 mg/dL — ABNORMAL HIGH (ref 65–99)
Potassium: 4.1 mmol/L (ref 3.5–5.1)
Sodium: 138 mmol/L (ref 135–145)
TOTAL PROTEIN: 6.3 g/dL — AB (ref 6.5–8.1)
Total Bilirubin: 1.8 mg/dL — ABNORMAL HIGH (ref 0.3–1.2)

## 2017-09-13 LAB — INFLUENZA PANEL BY PCR (TYPE A & B)
INFLAPCR: NEGATIVE
INFLBPCR: NEGATIVE

## 2017-09-13 LAB — PHOSPHORUS: PHOSPHORUS: 2.2 mg/dL — AB (ref 2.5–4.6)

## 2017-09-13 MED ORDER — IOPAMIDOL (ISOVUE-300) INJECTION 61%
INTRAVENOUS | Status: AC
  Administered 2017-09-13: 100 mL
  Filled 2017-09-13: qty 100

## 2017-09-13 MED ORDER — PIPERACILLIN-TAZOBACTAM 3.375 G IVPB 30 MIN
3.3750 g | Freq: Once | INTRAVENOUS | Status: AC
  Administered 2017-09-13: 3.375 g via INTRAVENOUS
  Filled 2017-09-13: qty 50

## 2017-09-13 MED ORDER — FOLIC ACID 1 MG PO TABS
1.0000 mg | ORAL_TABLET | Freq: Every day | ORAL | Status: DC
  Administered 2017-09-13 – 2017-09-16 (×4): 1 mg via ORAL
  Filled 2017-09-13 (×4): qty 1

## 2017-09-13 MED ORDER — ADULT MULTIVITAMIN W/MINERALS CH
1.0000 | ORAL_TABLET | Freq: Every day | ORAL | Status: DC
  Administered 2017-09-13 – 2017-09-16 (×4): 1 via ORAL
  Filled 2017-09-13 (×4): qty 1

## 2017-09-13 MED ORDER — VITAMIN B-1 100 MG PO TABS
100.0000 mg | ORAL_TABLET | Freq: Every day | ORAL | Status: DC
  Administered 2017-09-13 – 2017-09-16 (×4): 100 mg via ORAL
  Filled 2017-09-13 (×4): qty 1

## 2017-09-13 MED ORDER — ACETAMINOPHEN 325 MG PO TABS
ORAL_TABLET | ORAL | Status: AC
  Filled 2017-09-13: qty 2

## 2017-09-13 MED ORDER — PIPERACILLIN-TAZOBACTAM 3.375 G IVPB
3.3750 g | Freq: Three times a day (TID) | INTRAVENOUS | Status: DC
  Administered 2017-09-13 – 2017-09-15 (×6): 3.375 g via INTRAVENOUS
  Filled 2017-09-13 (×9): qty 50

## 2017-09-13 MED ORDER — ACETAMINOPHEN 325 MG PO TABS: 650.0000 mg | ORAL_TABLET | Freq: Three times a day (TID) | ORAL | Status: DC | PRN

## 2017-09-13 MED ORDER — VANCOMYCIN HCL IN DEXTROSE 1-5 GM/200ML-% IV SOLN: 1000.0000 mg | Freq: Once | INTRAVENOUS | Status: DC

## 2017-09-13 MED ORDER — FENTANYL CITRATE (PF) 100 MCG/2ML IJ SOLN
25.0000 ug | INTRAMUSCULAR | Status: DC | PRN
  Administered 2017-09-13: 25 ug via INTRAVENOUS
  Filled 2017-09-13: qty 2

## 2017-09-13 MED ORDER — TRAZODONE HCL 100 MG PO TABS
100.0000 mg | ORAL_TABLET | Freq: Every day | ORAL | Status: DC
  Administered 2017-09-13 – 2017-09-15 (×3): 100 mg via ORAL
  Filled 2017-09-13 (×3): qty 1

## 2017-09-13 MED ORDER — VANCOMYCIN HCL 10 G IV SOLR
1750.0000 mg | Freq: Once | INTRAVENOUS | Status: AC
  Administered 2017-09-13: 1750 mg via INTRAVENOUS
  Filled 2017-09-13: qty 1750

## 2017-09-13 MED ORDER — PNEUMOCOCCAL VAC POLYVALENT 25 MCG/0.5ML IJ INJ
0.5000 mL | INJECTION | INTRAMUSCULAR | Status: AC
  Administered 2017-09-14: 0.5 mL via INTRAMUSCULAR
  Filled 2017-09-13: qty 0.5

## 2017-09-13 MED ORDER — LACTULOSE 10 GM/15ML PO SOLN
10.0000 g | Freq: Three times a day (TID) | ORAL | Status: DC
  Administered 2017-09-14 – 2017-09-15 (×5): 10 g via ORAL
  Filled 2017-09-13 (×6): qty 15

## 2017-09-13 MED ORDER — SODIUM CHLORIDE 0.9 % IV BOLUS (SEPSIS)
1000.0000 mL | Freq: Once | INTRAVENOUS | Status: AC
  Administered 2017-09-13: 1000 mL via INTRAVENOUS

## 2017-09-13 MED ORDER — FENTANYL CITRATE (PF) 100 MCG/2ML IJ SOLN
100.0000 ug | Freq: Once | INTRAMUSCULAR | Status: AC
  Administered 2017-09-13: 100 ug via INTRAVENOUS
  Filled 2017-09-13: qty 2

## 2017-09-13 MED ORDER — ACETAMINOPHEN 325 MG PO TABS: 650.0000 mg | ORAL_TABLET | Freq: Once | ORAL | Status: DC | PRN

## 2017-09-13 MED ORDER — LAMOTRIGINE 100 MG PO TABS
200.0000 mg | ORAL_TABLET | Freq: Every day | ORAL | Status: DC
  Administered 2017-09-14 – 2017-09-16 (×3): 200 mg via ORAL
  Filled 2017-09-13 (×3): qty 2

## 2017-09-13 MED ORDER — FOLIC ACID 1 MG PO TABS: 1.0000 mg | ORAL_TABLET | Freq: Every day | ORAL | Status: DC

## 2017-09-13 MED ORDER — SODIUM CHLORIDE 0.9 % IV BOLUS (SEPSIS)
500.0000 mL | Freq: Once | INTRAVENOUS | Status: AC
  Administered 2017-09-13: 500 mL via INTRAVENOUS

## 2017-09-13 MED ORDER — VANCOMYCIN HCL IN DEXTROSE 750-5 MG/150ML-% IV SOLN
750.0000 mg | Freq: Two times a day (BID) | INTRAVENOUS | Status: DC
  Filled 2017-09-13: qty 150

## 2017-09-13 NOTE — ED Triage Notes (Addendum)
Pt here for cough that makes his back hurt , he went to [facility] and coming out of [facility]  Got dizzy and passed out  And went to floor, was assisted up by tech and nurses, states that he has been having dizzy spells

## 2017-09-13 NOTE — ED Notes (Signed)
Pt instructed to provide sputum sample in sterile cup when able--did not have any to cough up when asked at 17:00.

## 2017-09-13 NOTE — ED Notes (Signed)
Pt in CT.

## 2017-09-13 NOTE — ED Notes (Signed)
Rectal Temperature taken at 13:00 was 98.0 F.

## 2017-09-13 NOTE — Progress Notes (Signed)
Pharmacy Antibiotic Note  Patrick [name] is a 66 y.o. male admitted on 09/13/2017 with sepsis.  Pharmacy has been consulted for Zosyn and vancomycin dosing. WBC 3.8. SCr 1.26 (BL ~ 1.05). nCrCl ~55-60 mL/min   Plan: -Zosyn 3.375 gm IV Q 8 hours (EI infusion) -Vancomycin 1750 mg IV once, then start vancomycin 750 mg IV Q 12 hours -Monitor CBC, renal fx, cultures and clinical progress -VT at SS      Temp (24hrs), Avg:99.8 F (37.7 C), Min:98 F (36.7 C), Max:101.6 F (38.7 C)  Recent Labs  Lab 09/13/17 1146 09/13/17 1214  WBC 3.8*  --   CREATININE 1.26*  --   LATICACIDVEN  --  2.41*    CrCl cannot be calculated (Unknown ideal weight.).    Allergies  Allergen Reactions  . Shellfish Allergy Hives  . Tramadol Itching    Antimicrobials this admission: Zosyn 11/5 >>  Vancomycin  11/5 >>   Dose adjustments this admission: None  Microbiology results:   Thank you for allowing pharmacy to be a part of this patient's care.  Albertina Parr, PharmD., BCPS Clinical Pharmacist Pager [phone]

## 2017-09-13 NOTE — ED Notes (Signed)
I Stat Lactic Acid results shown to Dr. Dorthula Nettles.

## 2017-09-13 NOTE — H&P (Signed)
[facility] Admission History and Physical Service Pager: [phone]  Patient name: Patrick [name] Medical record number: [id number] Date of birth: December 13, 1950 Age: 66 y.o. Gender: male  Primary Care Provider: Dettinger, Fransisca Kaufmann, MD Consultants: GI Code Status: Full code (obtained on admission)  Chief Complaint: Abdominal pain  Assessment and Plan: Patrick [name] is a 66 y.o. male presenting with . PMH is significant for liver cirrhosis, alcoholism, hepatitis C, bipolar disorder,? COPD, tobacco use,   Right upper quadrant pain/nausea/vomiting: RUQ pain is chronic. No biliary findings on CT abdomen. Some concern for SBP given tenderness to palpation, fever and a history of cirrhosis.  Pain could also be due to the cough in the setting of URI.  Patient with history of cholecystectomy so unlikely cholecystitis.  Appendix and pancreas normal on CT abdomen.  He has lactic acidosis to 2.41 on arrival likely due to dehydration versus sepsis.  Exam not suggestive for mesenteric ischemia.  -Admit to MedSurg.  Attending Dr. McDiarmid -Continue Zosyn.  Discontinue Vanc.  -Follow-up GI recs. Consulted by ED provider -Follow-up cultures -Manage liver cirrhosis as below -KVO  Concern about sepsis: Patient with lactic acid 2.41 that has resolved with IV fluid.  Lactic acidosis likely due to dehydration from poor p.o. intake and emesis versus sepsis.  Apart from lactic acid to 2.41 that has resolved, he only have 1/4 SIRS (fever). qSOFA 0. He endorses URI symptoms.  He has mild AKI, likely due to dehydration.  Blood culture drawn.  Started on Vanco and [place] ED.  -Continue Zosyn -Hold Vanc.  Cough: Likely secondary to URI.  Patient had runny nose and congestion about a week ago.  Flu swab negative. He has no respiratory distress.  Lung exam within normal limits.  CXR without acute finding.  -Symptomatic therapy with Mucinex -He is already covered with Zosyn for infectious  etiology although this is a likely  Transaminases/elevated alk phos: Mild alcoholic pattern AST/ALT elevation.  States that he has been sober for the last 2 months.  Reports living in alcohol rehab. -We will get GGT, Mg & P -CIWA protocol  Liver cirrhosis: patient with history of alcohol abuse and hepatitis C. CT abdomen confirms liver cirrhosis.  He has no notable ascites on exam but portal hypertension on CT. MELD-Na 15 suggesting mortality rate of 2% in the next 90 days.  Still with fair synthetic functions.  -Continue lactulose 3 times daily -Zosyn as above for possible SBP -Obtain ammonia level  Portal thrombosis: Noted on CT abdomen today.  Recommended Doppler.  Patient is not a candidate for anticoagulation given his thrombocytopenia -Follow-up with GI recs.  Consulted by ED provider -Liver Doppler  Liver mass: A 4.1x 3.8 cm liver mass concerning for malignancy noted on CT abdomen.  This is concerning for [place] versus GI mets.  There with 2 intrahepatic lesions noted on CT abdomen in 06/2016.  No mention about the size and location on the CT abdomen.  -? Outpatient MRI liver protocol  Heart murmur: Noted 2/6 SEM over RUSB and LUSB.  Reports history of murmur.  Echocardiogram in 06/2016 with severe right to left shunt, otherwise normal.  Not sure if he had PFO closure. -He may need outpatient cardiology eval for this  Thrombocytopenia: Platelet 82.  Baseline 60s-70s.  Likely due to underlying liver cirrhosis/splenomegaly -Daily CBC -Avoid anticoagulations  AKI: Serum creatinine 1.26 on admission.  Baseline appears to be 0.9-1.0.  Likely prerenal in setting of dehydration from emesis.  Status  post 2.5 L of boluses -Repeat BMP in the morning  Bipolar disorder/depression/alcohol induced mood disorder: On Lamictal at home.  Reports good compliance with this medication.  Last dose this morning.  Denies SI or HI -Continue home Lamictal in the morning  History of COPD: No PFT in the chart.   Not on any medications at home.  No respiratory distress.  Lung exam within normal limits. -Albuterol as needed  Back pain: Tenderness to palpation over his right upper back suggestive for musculoskeletal etiology. -K pad -Tylenol 650 mg every 8 hours as needed pain.  -Fentanyl 25 mcg every 4 hours as needed  History of alcohol use: States that he is sober for the last 2 months.  Now in rehab. -CIWA -CSW consulted  Tobacco use: Reports smoking about 7 cigarettes a day.  Declines nicotine patch. -Offered nicotine patch when needed  FEN/GI: -Low-sodium diet -Lactulose as above  Prophylaxis: SCD given history of thrombocytopenia  Disposition: We will admit to [facility] for abdominal pain and fever   History of Present Illness:  Patrick [name] is a 66 y.o. male presenting with cough, abdominal pain and fever.  Patient reports having cold-like symptoms such as runny and congestion about a week ago.  Then he developed cough about 3 days ago. Cough is dry.  He reports right upper quadrant and right back pain with cough for the last 3 days.  He also reports nausea and vomiting for the last 3 days.  He reports 2 episodes of emesis yesterday and one episode this morning which are nonbilious and nonbloody.  He denies diarrhea.  He is on lactulose for his cirrhosis.  He reports having at least 2 bowel movements a day on lactulose at baseline.  His last bowel movement was yesterday.  He is not on antibiotic for SBP prophylaxis.  He says he is sober for the last 2 months.  He lives in alcohol rehab.  He denies history of paracentesis in the past.   Patient denies chest pain, shortness of breath, diarrhea & dysuria  ED course: Vital signs within normal limits except for a fever to 101.6 on arrival.  CBC remarkable for mild baseline pancytopenia.  Lactic acid 2.41 on arrival and trended down to 1.60 and then 0.9.  Influenza a and B-.  UA normal.  CXR without acute finding.  CT chest normal.  CT abdomen  and pelvis with hepatic cirrhosis, mild splenomegaly, portal hypertension, 4.1 x 3.8 cm mass in the left hepatic lobe concerning for possible neoplasm/HCC and ?portal venous thrombosis.  Blood culture urine culture were obtained.  Patient received 2.5 L of bolus, started on Vanco and Zosyn for presumed sepsis.  Family medicine was paged to admit the patient for further evaluation and management.  Review of Systems  Constitutional: Positive for fever. Negative for weight loss.  HENT: Negative for sore throat.   Eyes: Negative for blurred vision, photophobia and pain.  Respiratory: Positive for cough. Negative for hemoptysis, shortness of breath and wheezing.   Cardiovascular: Negative for chest pain, palpitations and leg swelling.  Gastrointestinal: Positive for abdominal pain, nausea and vomiting. Negative for blood in stool and diarrhea.  Genitourinary: Negative for dysuria and hematuria.  Musculoskeletal: Positive for back pain. Negative for myalgias.  Skin: Negative for rash.  Neurological: Negative for speech change, focal weakness and headaches.  Endo/Heme/Allergies: Does not bruise/bleed easily.  Psychiatric/Behavioral: Negative for substance abuse. The patient is not nervous/anxious.    Patient Active Problem List   Diagnosis  Date Noted  . Abdominal pain 09/13/2017  . Sepsis ([place]) 09/13/2017  . Bipolar affective disorder, depressed, mild ([place]) 08/03/2017  . Evaluation by medical service required   . Suicidal ideations 07/15/2017  . Alcohol-induced mood disorder ([place]) 07/14/2017  . Occult blood positive stool 07/14/2017  . ETOH abuse 07/14/2017  . Thrombocytopenia ([place]) 07/14/2017  . Atypical chest pain 07/14/2017  . COPD (chronic obstructive pulmonary disease) ([place]) 07/08/2017  . Hepatic encephalopathy ([place]) 01/05/2015  . Bipolar 1 disorder ([place]) 01/05/2015  . Insomnia 01/05/2015  . Anemia, chronic disease 01/05/2015  . Neuropathy 01/05/2015  . Cirrhosis ([place]) 01/05/2015  .  History of stroke 01/05/2015  . Altered mental status     Past Medical History: Past Medical History:  Diagnosis Date  . Anxiety   . Bipolar 1 disorder ([place])   . Cirrhosis ([place])   . COPD (chronic obstructive pulmonary disease) ([place])   . Depression   . Heart murmur   . Hepatitis C   . Oxygen deficiency   . Polysubstance abuse ([place])   . Stroke [facility])     Past Surgical History: Past Surgical History:  Procedure Laterality Date  . arm surgery      Social History: Social History   Tobacco Use  . Smoking status: Current Every Day Smoker    Packs/day: 0.25    Years: 50.00    Pack years: 12.50    Types: Cigarettes  . Smokeless tobacco: Never Used  Substance Use Topics  . Alcohol use: Yes    Comment: 26 months alcohol free- relapse on 07/10/17  . Drug use: No   Additional social history: lives at [facility] for prior alcoholism. Located in [place]. Been living there for 5 mo. Feels it is working well for him.  Please also refer to relevant sections of EMR.  Family History: Family History  Problem Relation Age of Onset  . Arthritis Mother   . Cancer Father        lung  . Diabetes Father   . Mental illness Brother        depression  . Heart disease Paternal Grandmother   . Cancer Paternal Grandmother        skin  . Diabetes Paternal Uncle    (If not completed, MUST add something in)  Allergies and Medications: Allergies  Allergen Reactions  . Shellfish Allergy Hives  . Tramadol Itching   No current facility-administered medications on file prior to encounter.    Current Outpatient Medications on File Prior to Encounter  Medication Sig Dispense Refill  . folic acid (FOLVITE) 1 MG tablet Take 1 tablet (1 mg total) by mouth daily. 30 tablet 0  . hydrOXYzine (VISTARIL) 100 MG capsule Take 100 mg 2 (two) times daily by mouth.    Marland Kitchen ibuprofen (ADVIL,MOTRIN) 200 MG tablet Take 400 mg every 6 (six) hours as needed by mouth.    . lactulose (CHRONULAC) 10 GM/15ML  solution Take 15 mLs (10 g total) by mouth 3 (three) times daily. (Patient taking differently: Take 20 g 3 (three) times daily by mouth. ) 240 mL 0  . lamoTRIgine (LAMICTAL) 200 MG tablet Take 200 mg daily by mouth.    . naproxen sodium (ALEVE) 220 MG tablet Take 440 mg 2 (two) times daily as needed by mouth (pain).    . traZODone (DESYREL) 150 MG tablet Take 150 mg at bedtime by mouth.    . nicotine (NICODERM CQ - DOSED IN MG/24 HOURS) 21 mg/24hr patch Place 1 patch (  21 mg total) onto the skin daily. (Patient not taking: Reported on 09/13/2017) 28 patch 0  . pantoprazole (PROTONIX) 40 MG tablet Take 1 tablet (40 mg total) by mouth 2 (two) times daily. (Patient not taking: Reported on 09/13/2017) 60 tablet 2  . sucralfate (CARAFATE) 1 GM/10ML suspension Take 10 mLs (1 g total) by mouth 2 (two) times daily. (Patient not taking: Reported on 09/13/2017) 420 mL 0  . thiamine 100 MG tablet Take 1 tablet (100 mg total) by mouth daily. (Patient not taking: Reported on 09/13/2017) 30 tablet 0  . traZODone (DESYREL) 100 MG tablet Take 1 tablet (100 mg total) by mouth at bedtime. (Patient not taking: Reported on 09/13/2017) 30 tablet 2    Objective: BP 114/60 (BP Location: Right Arm)   Pulse 74   Temp 97.8 F (36.6 C) (Oral)   Resp 18   Ht '5\' 8"'$  (1.727 m)   Wt 200 lb 13.4 oz (91.1 kg)   SpO2 94%   BMI 30.54 kg/m  Exam: GEN: appears well, no apparent distress. Head: normocephalic and atraumatic  Eyes: conjunctiva without injection, sclera anicteric, arcus senilis Ears: external ear and ear canal normal Oropharynx: mmm without erythema or exudation.  No upper dentition HEM: negative for cervical or periauricular lymphadenopathies CVS: RRR, nl s1 & s2, 2/6 SEM over RUSB, LUSB and LLSB, trace edema bilaterally RESP: speaks in full sentence, no IWOB, good air movement bilaterally, CTAB GI: BS present & normal, soft, mild tenderness to palpation over RLQ, no ascites GU: no suprapubic or CVA  tenderness MSK: Tenderness to palpation over right mid thoracic ribs posteriorly SKIN: no apparent skin lesion NEURO: alert and oiented appropriately, no gross defecits.  No asterixis PSYCH: euthymic mood with congruent affect, no SI/HI  Labs and Imaging: CBC BMET  Recent Labs  Lab 09/13/17 1146  WBC 3.8*  HGB 12.6*  HCT 36.9*  PLT 84*   Recent Labs  Lab 09/13/17 1146  NA 138  K 4.1  CL 110  CO2 23  BUN 9  CREATININE 1.26*  GLUCOSE 131*  CALCIUM 8.4*    Dg Chest 2 View  Result Date: 09/13/2017 CLINICAL DATA:  Fever and cough.  Right rib pain. EXAM: CHEST  2 VIEW COMPARISON:  07/14/2017.  03/22/2017. FINDINGS: Lungs are hyperexpanded. Interstitial markings are diffusely coarsened with chronic features. The lungs are clear without focal pneumonia, edema, pneumothorax or pleural effusion. Right hilar fullness is similar to prior studies and likely represents vascular anatomy. The visualized bony structures of the thorax are intact. IMPRESSION: Stable.  No acute findings. Electronically Signed   By: Misty Stanley M.D.   On: 09/13/2017 12:55   Ct Chest W Contrast  Result Date: 09/13/2017 CLINICAL DATA:  Cough, back pain. EXAM: CT CHEST, ABDOMEN, AND PELVIS WITH CONTRAST TECHNIQUE: Multidetector CT imaging of the chest, abdomen and pelvis was performed following the standard protocol during bolus administration of intravenous contrast. CONTRAST:  142m ISOVUE-300 IOPAMIDOL (ISOVUE-300) INJECTION 61% COMPARISON:  CT scan of February 16, 2006. FINDINGS: CT CHEST FINDINGS Cardiovascular: Atherosclerosis of thoracic aorta is noted without aneurysm or dissection. Coronary artery calcifications are noted. No pericardial effusion is noted. Normal cardiac size. Mediastinum/Nodes: No enlarged mediastinal, hilar, or axillary lymph nodes. Thyroid gland, trachea, and esophagus demonstrate no significant findings. Lungs/Pleura: Lungs are clear. No pleural effusion or pneumothorax. Musculoskeletal: No  chest wall mass or suspicious bone lesions identified. CT ABDOMEN PELVIS FINDINGS Hepatobiliary: Heterogeneous appearance and nodular hepatic contours are noted consistent with hepatic cirrhosis.  4.1 x 3.8 cm low density is noted in left hepatic lobe concerning for possible neoplasm. Status post cholecystectomy. Pancreas: Unremarkable. No pancreatic ductal dilatation or surrounding inflammatory changes. Spleen: Mild splenomegaly is noted. Adrenals/Urinary Tract: Adrenal glands are unremarkable. Kidneys are normal, without renal calculi, focal lesion, or hydronephrosis. Bladder is unremarkable. Stomach/Bowel: Stomach is within normal limits. Appendix appears normal. No evidence of bowel wall thickening, distention, or inflammatory changes. Vascular/Lymphatic: Aortic atherosclerosis. No enlarged abdominal or pelvic lymph nodes. Reproductive: Prostate is unremarkable. Other: There appears to be portal venous thrombosis most likely due to hepatic cirrhosis. Collateral veins are seen around the spleen and pancreas as well as patent umbilical vein along the anterior abdominal wall. Musculoskeletal: No acute or significant osseous findings. IMPRESSION: Hepatic cirrhosis is noted with mild splenomegaly and enlarged collateral veins consistent with portal hypertension. No contrast is seen in the main portal vein concerning for portal venous thrombosis. Doppler ultrasound may be performed for further evaluation. Aortic atherosclerosis. 4.1 x 3.8 cm low density is noted in left hepatic lobe which may represent hepatic cirrhosis, but hepatocellular carcinoma cannot be excluded. Further evaluation with MRI with and without gadolinium is recommended on nonemergent basis when patient can hold still and follow-up breathing instructions for the MRI. Electronically Signed   By: Marijo Conception, M.D.   On: 09/13/2017 16:09   Ct Abdomen Pelvis W Contrast  Result Date: 09/13/2017 CLINICAL DATA:  Cough, back pain. EXAM: CT CHEST,  ABDOMEN, AND PELVIS WITH CONTRAST TECHNIQUE: Multidetector CT imaging of the chest, abdomen and pelvis was performed following the standard protocol during bolus administration of intravenous contrast. CONTRAST:  130m ISOVUE-300 IOPAMIDOL (ISOVUE-300) INJECTION 61% COMPARISON:  CT scan of February 16, 2006. FINDINGS: CT CHEST FINDINGS Cardiovascular: Atherosclerosis of thoracic aorta is noted without aneurysm or dissection. Coronary artery calcifications are noted. No pericardial effusion is noted. Normal cardiac size. Mediastinum/Nodes: No enlarged mediastinal, hilar, or axillary lymph nodes. Thyroid gland, trachea, and esophagus demonstrate no significant findings. Lungs/Pleura: Lungs are clear. No pleural effusion or pneumothorax. Musculoskeletal: No chest wall mass or suspicious bone lesions identified. CT ABDOMEN PELVIS FINDINGS Hepatobiliary: Heterogeneous appearance and nodular hepatic contours are noted consistent with hepatic cirrhosis. 4.1 x 3.8 cm low density is noted in left hepatic lobe concerning for possible neoplasm. Status post cholecystectomy. Pancreas: Unremarkable. No pancreatic ductal dilatation or surrounding inflammatory changes. Spleen: Mild splenomegaly is noted. Adrenals/Urinary Tract: Adrenal glands are unremarkable. Kidneys are normal, without renal calculi, focal lesion, or hydronephrosis. Bladder is unremarkable. Stomach/Bowel: Stomach is within normal limits. Appendix appears normal. No evidence of bowel wall thickening, distention, or inflammatory changes. Vascular/Lymphatic: Aortic atherosclerosis. No enlarged abdominal or pelvic lymph nodes. Reproductive: Prostate is unremarkable. Other: There appears to be portal venous thrombosis most likely due to hepatic cirrhosis. Collateral veins are seen around the spleen and pancreas as well as patent umbilical vein along the anterior abdominal wall. Musculoskeletal: No acute or significant osseous findings. IMPRESSION: Hepatic cirrhosis is  noted with mild splenomegaly and enlarged collateral veins consistent with portal hypertension. No contrast is seen in the main portal vein concerning for portal venous thrombosis. Doppler ultrasound may be performed for further evaluation. Aortic atherosclerosis. 4.1 x 3.8 cm low density is noted in left hepatic lobe which may represent hepatic cirrhosis, but hepatocellular carcinoma cannot be excluded. Further evaluation with MRI with and without gadolinium is recommended on nonemergent basis when patient can hold still and follow-up breathing instructions for the MRI. Electronically Signed   By: JSabino Dick  Brooke Bonito, M.D.   On: 09/13/2017 16:09   Mercy Riding, MD 09/13/2017, 10:09 PM PGY-3, [place] Intern pager: [phone], text pages welcome

## 2017-09-13 NOTE — ED Notes (Addendum)
RN went to waiting room and no answer. RN called xray and he is there; asked to bring to 14 when done.

## 2017-09-14 ENCOUNTER — Inpatient Hospital Stay (HOSPITAL_COMMUNITY): Payer: Medicare Other

## 2017-09-14 DIAGNOSIS — F319 Bipolar disorder, unspecified: Secondary | ICD-10-CM

## 2017-09-14 DIAGNOSIS — J439 Emphysema, unspecified: Secondary | ICD-10-CM

## 2017-09-14 DIAGNOSIS — K766 Portal hypertension: Secondary | ICD-10-CM

## 2017-09-14 DIAGNOSIS — K7030 Alcoholic cirrhosis of liver without ascites: Secondary | ICD-10-CM

## 2017-09-14 DIAGNOSIS — M549 Dorsalgia, unspecified: Secondary | ICD-10-CM | POA: Diagnosis present

## 2017-09-14 DIAGNOSIS — M546 Pain in thoracic spine: Secondary | ICD-10-CM

## 2017-09-14 DIAGNOSIS — D696 Thrombocytopenia, unspecified: Secondary | ICD-10-CM

## 2017-09-14 DIAGNOSIS — A419 Sepsis, unspecified organism: Secondary | ICD-10-CM

## 2017-09-14 DIAGNOSIS — I700 Atherosclerosis of aorta: Secondary | ICD-10-CM | POA: Diagnosis present

## 2017-09-14 DIAGNOSIS — R160 Hepatomegaly, not elsewhere classified: Secondary | ICD-10-CM

## 2017-09-14 DIAGNOSIS — I81 Portal vein thrombosis: Secondary | ICD-10-CM

## 2017-09-14 DIAGNOSIS — R55 Syncope and collapse: Secondary | ICD-10-CM | POA: Diagnosis not present

## 2017-09-14 DIAGNOSIS — B1920 Unspecified viral hepatitis C without hepatic coma: Secondary | ICD-10-CM | POA: Diagnosis present

## 2017-09-14 DIAGNOSIS — K7469 Other cirrhosis of liver: Secondary | ICD-10-CM

## 2017-09-14 DIAGNOSIS — R1011 Right upper quadrant pain: Secondary | ICD-10-CM

## 2017-09-14 DIAGNOSIS — F1020 Alcohol dependence, uncomplicated: Secondary | ICD-10-CM

## 2017-09-14 LAB — CBC
HEMATOCRIT: 32.6 % — AB (ref 39.0–52.0)
Hemoglobin: 10.7 g/dL — ABNORMAL LOW (ref 13.0–17.0)
MCH: 27.4 pg (ref 26.0–34.0)
MCHC: 32.8 g/dL (ref 30.0–36.0)
MCV: 83.6 fL (ref 78.0–100.0)
PLATELETS: 78 10*3/uL — AB (ref 150–400)
RBC: 3.9 MIL/uL — AB (ref 4.22–5.81)
RDW: 19.3 % — AB (ref 11.5–15.5)
WBC: 2.6 10*3/uL — AB (ref 4.0–10.5)

## 2017-09-14 LAB — COMPREHENSIVE METABOLIC PANEL
ALK PHOS: 151 U/L — AB (ref 38–126)
ALT: 35 U/L (ref 17–63)
AST: 84 U/L — ABNORMAL HIGH (ref 15–41)
Albumin: 2.4 g/dL — ABNORMAL LOW (ref 3.5–5.0)
Anion gap: 3 — ABNORMAL LOW (ref 5–15)
BUN: 9 mg/dL (ref 6–20)
CALCIUM: 7.9 mg/dL — AB (ref 8.9–10.3)
CO2: 22 mmol/L (ref 22–32)
CREATININE: 1.14 mg/dL (ref 0.61–1.24)
Chloride: 114 mmol/L — ABNORMAL HIGH (ref 101–111)
GFR calc non Af Amer: >60 mL/min (ref >60)
Glucose, Bld: 74 mg/dL (ref 65–99)
Potassium: 3.7 mmol/L (ref 3.5–5.1)
Sodium: 139 mmol/L (ref 135–145)
Total Bilirubin: 2 mg/dL — ABNORMAL HIGH (ref 0.3–1.2)
Total Protein: 5.7 g/dL — ABNORMAL LOW (ref 6.5–8.1)

## 2017-09-14 LAB — MRSA PCR SCREENING: MRSA BY PCR: NEGATIVE

## 2017-09-14 LAB — PROTIME-INR
INR: 1.38
Prothrombin Time: 16.9 seconds — ABNORMAL HIGH (ref 11.4–15.2)

## 2017-09-14 LAB — URINE CULTURE: CULTURE: NO GROWTH

## 2017-09-14 LAB — GAMMA GT: GGT: 158 U/L — ABNORMAL HIGH (ref 7–50)

## 2017-09-14 LAB — AMMONIA: AMMONIA: 72 umol/L — AB (ref 9–35)

## 2017-09-14 LAB — LIPASE, BLOOD: LIPASE: 28 U/L (ref 11–51)

## 2017-09-14 MED ORDER — IPRATROPIUM BROMIDE 0.02 % IN SOLN
0.5000 mg | Freq: Four times a day (QID) | RESPIRATORY_TRACT | Status: DC
  Administered 2017-09-14 – 2017-09-15 (×4): 0.5 mg via RESPIRATORY_TRACT
  Filled 2017-09-14 (×4): qty 2.5

## 2017-09-14 MED ORDER — IPRATROPIUM BROMIDE HFA 17 MCG/ACT IN AERS: 2.0000 | INHALATION_SPRAY | Freq: Four times a day (QID) | RESPIRATORY_TRACT | Status: DC

## 2017-09-14 MED ORDER — ACETAMINOPHEN 325 MG PO TABS
650.0000 mg | ORAL_TABLET | Freq: Three times a day (TID) | ORAL | Status: DC
  Administered 2017-09-14 – 2017-09-16 (×7): 650 mg via ORAL
  Filled 2017-09-14 (×7): qty 2

## 2017-09-14 MED ORDER — FENTANYL CITRATE (PF) 100 MCG/2ML IJ SOLN
25.0000 ug | INTRAMUSCULAR | Status: DC | PRN
  Administered 2017-09-14 – 2017-09-15 (×5): 25 ug via INTRAVENOUS
  Filled 2017-09-14 (×6): qty 2

## 2017-09-14 MED ORDER — CYCLOBENZAPRINE HCL 5 MG PO TABS
5.0000 mg | ORAL_TABLET | Freq: Three times a day (TID) | ORAL | Status: DC | PRN
  Administered 2017-09-14 – 2017-09-16 (×3): 5 mg via ORAL
  Filled 2017-09-14 (×3): qty 1

## 2017-09-14 NOTE — Evaluation (Signed)
Occupational Therapy Evaluation Patient Details Name: Patrick [name] MRN: [id number] DOB: 06-30-1951 Today's Date: 09/14/2017    History of Present Illness Pt is 66 y.o. male admitted to hospital with c/o back pain with coughing and dizzy spells. Pt had syncopal episode during hospital stay. Chest CT - aortic atherosclerosis and hepatic cirrhosis. PMH includes COPD, stroke, depression, cirrhosis of liver, alcohol and tobacco abuse, and bipolar disorder.   Clinical Impression   PTA, pt was living at a group home and performing ADLs and light IADLs. Pt currently performing ADLs and functional mobility at supervision-Min Guard level for safety. Pt demonstrating decreased activity tolerance as seen by fatigue and decreased SpO2. Pt would benefit form further acute OT to address energy conservation during ADLs and facilitate safe dc. Recommend dc home once medically stable per physician.     Follow Up Recommendations  No OT follow up;Supervision/Assistance - 24 hour    Equipment Recommendations  None recommended by OT    Recommendations for Other Services PT consult     Precautions / Restrictions Precautions Precautions: Fall Restrictions Weight Bearing Restrictions: No      Mobility Bed Mobility Overal bed mobility: Modified Independent Bed Mobility: Supine to Sit     Supine to sit: HOB elevated;Modified independent (Device/Increase time)     General bed mobility comments: Increased time  Transfers Overall transfer level: Needs assistance Equipment used: None Transfers: Sit to/from Stand Sit to Stand: Min guard         General transfer comment: Min Guard for safety    Balance Overall balance assessment: Needs assistance Sitting-balance support: No upper extremity supported;Feet supported Sitting balance-Leahy Scale: Good     Standing balance support: No upper extremity supported;During functional activity Standing balance-Leahy Scale: Good                              ADL either performed or assessed with clinical judgement   ADL Overall ADL's : Needs assistance/impaired Eating/Feeding: Set up;Sitting   Grooming: Set up;Supervision/safety;Standing Grooming Details (indicate cue type and reason): Pt performing oral care at sink. Demonstrating decreased activity toelrance as seen by quickly fatiguing and decrease in SpO2 to 86 on room air Upper Body Bathing: Set up;Supervision/ safety;Sitting   Lower Body Bathing: Min guard;Sit to/from stand   Upper Body Dressing : Set up;Supervision/safety;Sitting   Lower Body Dressing: Min guard;Sit to/from stand Lower Body Dressing Details (indicate cue type and reason): Pt SpO2 dropping to 86 on roomair during LB ADLs Toilet Transfer: Min guard;Ambulation(Simulated in room)           Functional mobility during ADLs: Min guard General ADL Comments: Pt performing ADLs and funcitonal mobility at supervision-Min Guard level. Pt with poor activity tolerance. Pt would benefit from educaiton on EC to optimize independence with ADLs and IADLs at home     Vision Baseline Vision/History: Wears glasses Wears Glasses: Reading only Patient Visual Report: No change from baseline       Perception     Praxis      Pertinent Vitals/Pain Pain Assessment: No/denies pain     Hand Dominance Right   Extremity/Trunk Assessment Upper Extremity Assessment Upper Extremity Assessment: Overall WFL for tasks assessed   Lower Extremity Assessment Lower Extremity Assessment: Overall WFL for tasks assessed   Cervical / Trunk Assessment Cervical / Trunk Assessment: Normal   Communication Communication Communication: No difficulties   Cognition Arousal/Alertness: Awake/alert Behavior During Therapy: WFL for tasks assessed/performed Overall  Cognitive Status: Within Functional Limits for tasks assessed                                     General Comments  Pt on roomair thorughout  session. SpO2 dropping to 86 during LB ADLs and grooming at sink. Pt performing purse lip breathing and SpO2 rising to low 90s    Exercises     Shoulder Instructions      Home Living Family/patient expects to be discharged to:: Group home   Available Help at Discharge: Friend(s)   Home Access: Level entry     Home Layout: Two level;Bed/bath upstairs Alternate Level Stairs-Number of Steps: 14   Bathroom Shower/Tub: Teacher, early years/pre: Standard     Home Equipment: Cane - single point   Additional Comments: Pt's bedroom on 2nd floor. Group home provides transportation as needed      Prior Functioning/Environment Level of Independence: Independent with assistive device(s)        Comments: Pt performed ADLs and light IADLs. Pt uses SPC at home intermittently.        OT Problem List: Decreased activity tolerance;Impaired balance (sitting and/or standing);Decreased safety awareness;Decreased knowledge of precautions      OT Treatment/Interventions: Self-care/ADL training;Therapeutic exercise;Energy conservation;DME and/or AE instruction;Therapeutic activities;Patient/family education    OT Goals(Current goals can be found in the care plan section) Acute Rehab OT Goals Patient Stated Goal: return to group home OT Goal Formulation: With patient Time For Goal Achievement: 09/28/17 Potential to Achieve Goals: Good ADL Goals Pt Will Perform Grooming: with modified independence;standing Pt Will Perform Upper Body Dressing: with modified independence;standing Pt Will Perform Lower Body Dressing: with modified independence;sit to/from stand Additional ADL Goal #1: Pt will verbalize three energy conservation techniques with Min VCs.  OT Frequency: Min 2X/week   Barriers to D/C:            Co-evaluation              AM-PAC PT "6 Clicks" Daily Activity     Outcome Measure Help from another person eating meals?: None Help from another person taking  care of personal grooming?: A Little Help from another person toileting, which includes using toliet, bedpan, or urinal?: A Little Help from another person bathing (including washing, rinsing, drying)?: A Little Help from another person to put on and taking off regular upper body clothing?: A Little Help from another person to put on and taking off regular lower body clothing?: A Little 6 Click Score: 19   End of Session Equipment Utilized During Treatment: Gait belt Nurse Communication: Mobility status;Other (comment)(SpO2)  Activity Tolerance: Patient tolerated treatment well;Patient limited by fatigue Patient left: in bed;with call bell/phone within reach;with bed alarm set  OT Visit Diagnosis: Unsteadiness on feet (R26.81);Other abnormalities of gait and mobility (R26.89);Muscle weakness (generalized) (M62.81)                Time: 0947-0962 OT Time Calculation (min): 18 min Charges:  OT General Charges $OT Visit: 1 Visit OT Evaluation $OT Eval Low Complexity: 1 Low G-Codes:     Yohance Hathorne MSOT, OTR/L Acute Rehab Pager: [phone] Office: [place] 09/14/2017, 5:19 PM

## 2017-09-14 NOTE — Consult Note (Signed)
[facility] Consultation Note  Referring Provider: Dr. Sherren Mocha McDiarmid Primary Care Physician:  Dettinger, Fransisca Kaufmann, MD Primary Gastroenterologist:  None  Reason for Consultation:  Abdominal pain, fevers, cirrhosis   HPI: Patrick [name] is a 66 y.o. male admitted for above reasons.  Symptoms started few days ago after experiencing a "cold."  Has right upper quadrant and back discomfort, fairly constant, no clear alleviating or exacerbating features. No encephalopathy, GI bleeding, new medication, change in bowel habits.  Had endoscopy about 1 year ago, normal per patient recollection.  Had been following [facility] hepatology, but since he moved here to [place] he hasn't been back to see them in several months.   Past Medical History:  Diagnosis Date  . Anxiety   . Bipolar 1 disorder ([place])   . Cirrhosis ([place])   . COPD (chronic obstructive pulmonary disease) ([place])   . Depression   . Heart murmur   . Hepatitis C   . Oxygen deficiency   . Polysubstance abuse ([place])   . Stroke [facility])     Past Surgical History:  Procedure Laterality Date  . arm surgery      Prior to Admission medications   Medication Sig Start Date End Date Taking? Authorizing Provider  folic acid (FOLVITE) 1 MG tablet Take 1 tablet (1 mg total) by mouth daily. 08/04/17  Yes Nita Sells, MD  hydrOXYzine (VISTARIL) 100 MG capsule Take 100 mg 2 (two) times daily by mouth.   Yes [provider]  ibuprofen (ADVIL,MOTRIN) 200 MG tablet Take 400 mg every 6 (six) hours as needed by mouth.   Yes [provider]  lactulose (CHRONULAC) 10 GM/15ML solution Take 15 mLs (10 g total) by mouth 3 (three) times daily. Patient taking differently: Take 20 g 3 (three) times daily by mouth.  08/03/17  Yes Nita Sells, MD  lamoTRIgine (LAMICTAL) 200 MG tablet Take 200 mg daily by mouth.   Yes [provider]  naproxen sodium (ALEVE) 220 MG tablet Take 440 mg 2 (two) times daily as  needed by mouth (pain).   Yes [provider]  traZODone (DESYREL) 150 MG tablet Take 150 mg at bedtime by mouth.   Yes [provider]  nicotine (NICODERM CQ - DOSED IN MG/24 HOURS) 21 mg/24hr patch Place 1 patch (21 mg total) onto the skin daily. Patient not taking: Reported on 09/13/2017 08/04/17   Nita Sells, MD  pantoprazole (PROTONIX) 40 MG tablet Take 1 tablet (40 mg total) by mouth 2 (two) times daily. Patient not taking: Reported on 09/13/2017 08/03/17   Nita Sells, MD  sucralfate (CARAFATE) 1 GM/10ML suspension Take 10 mLs (1 g total) by mouth 2 (two) times daily. Patient not taking: Reported on 09/13/2017 08/03/17   Nita Sells, MD  thiamine 100 MG tablet Take 1 tablet (100 mg total) by mouth daily. Patient not taking: Reported on 09/13/2017 08/04/17   Nita Sells, MD  traZODone (DESYREL) 100 MG tablet Take 1 tablet (100 mg total) by mouth at bedtime. Patient not taking: Reported on 09/13/2017 08/03/17   Nita Sells, MD    Current Facility-Administered Medications  Medication Dose Route Frequency Provider Last Rate Last Dose  . acetaminophen (TYLENOL) tablet 650 mg  650 mg Oral TID McDiarmid, Blane Ohara, MD   650 mg at 09/14/17 1019  . fentaNYL (SUBLIMAZE) injection 25 mcg  25 mcg Intravenous Q3H PRN Criss Rosales, Scott, DO   25 mcg at 09/14/17 1020  . folic acid (FOLVITE) tablet 1 mg  1  mg Oral Daily Wendee Beavers T, MD   1 mg at 09/14/17 1019  . ipratropium (ATROVENT) nebulizer solution 0.5 mg  0.5 mg Nebulization QID McDiarmid, Blane Ohara, MD   0.5 mg at 09/14/17 1041  . lactulose (CHRONULAC) 10 GM/15ML solution 10 g  10 g Oral TID Wendee Beavers T, MD   10 g at 09/14/17 1018  . lamoTRIgine (LAMICTAL) tablet 200 mg  200 mg Oral Daily Wendee Beavers T, MD   200 mg at 09/14/17 1018  . multivitamin with minerals tablet 1 tablet  1 tablet Oral Daily Mercy Riding, MD   1 tablet at 09/14/17 1018  . piperacillin-tazobactam (ZOSYN) IVPB 3.375 g  3.375 g  Intravenous Q8H MancherilDarnell Level, RPH 12.5 mL/hr at 09/14/17 1134 3.375 g at 09/14/17 1134  . thiamine (VITAMIN B-1) tablet 100 mg  100 mg Oral Daily Wendee Beavers T, MD   100 mg at 09/14/17 1019  . traZODone (DESYREL) tablet 100 mg  100 mg Oral QHS Wendee Beavers T, MD   100 mg at 09/13/17 2153    Allergies as of 09/13/2017 - Review Complete 09/13/2017  Allergen Reaction Noted  . Shellfish allergy Hives 07/14/2017  . Tramadol Itching 06/27/2016    Family History  Problem Relation Age of Onset  . Arthritis Mother   . Cancer Father        lung  . Diabetes Father   . Mental illness Brother        depression  . Heart disease Paternal Grandmother   . Cancer Paternal Grandmother        skin  . Diabetes Paternal Uncle     Social History   Socioeconomic History  . Marital status: Divorced    Spouse name: Not on file  . Number of children: Not on file  . Years of education: Not on file  . Highest education level: Not on file  Social Needs  . Financial resource strain: Not on file  . Food insecurity - worry: Not on file  . Food insecurity - inability: Not on file  . Transportation needs - medical: Not on file  . Transportation needs - non-medical: Not on file  Occupational History  . Not on file  Tobacco Use  . Smoking status: Current Every Day Smoker    Packs/day: 0.25    Years: 50.00    Pack years: 12.50    Types: Cigarettes  . Smokeless tobacco: Never Used  Substance and Sexual Activity  . Alcohol use: Yes    Comment: 26 months alcohol free- relapse on 07/10/17  . Drug use: No  . Sexual activity: No    Birth control/protection: Condom  Other Topics Concern  . Not on file  Social History Narrative  . Not on file    Review of Systems: As per HPI, all others negative Physical Exam: Vital signs in last 24 hours: Temp:  [97.8 F (36.6 C)-98.4 F (36.9 C)] 98.4 F (36.9 C) (11/06 1225) Pulse Rate:  [67-76] 68 (11/06 1225) Resp:  [13-18] 18 (11/06 1225) BP:  (100-143)/(55-83) 100/62 (11/06 1225) SpO2:  [91 %-95 %] 95 % (11/06 1225) Weight:  [200 lb 13.4 oz (91.1 kg)] 200 lb 13.4 oz (91.1 kg) (11/05 1931) Last BM Date: 09/12/17 General:   Alert,  Chronically ill-appearing but NAD Head:  Normocephalic and atraumatic. Eyes:  Sclera clear, no icterus.   Conjunctiva pink. Ears:  Normal auditory acuity. Nose:  No deformity, discharge,  or lesions. Mouth:  No deformity  or lesions.  Oropharynx pink & moist. Poor dentition Neck:  Supple; no masses or thyromegaly. Lungs:  Clear throughout to auscultation.   No wheezes, crackles, or rhonchi. No acute distress. Heart:  Regular rate and rhythm; no murmurs, clicks, rubs,  or gallops. Abdomen:  Soft, mild right upper quadrant tenderness, nondistended. No masses, hepatosplenomegaly or hernias noted. Normal bowel sounds, without guarding, and without rebound.     Msk:  Symmetrical without gross deformities. Normal posture. Pulses:  Normal pulses noted. Extremities:  Without clubbing or edema. Neurologic:  Alert and  oriented x4; no encephalopathy, diffusely weak, otherwise grossly normal neurologically. Skin:  Intact without significant lesions or rashes. Psych:  Alert and cooperative. Normal mood and affect.   Lab Results: Recent Labs    09/13/17 1146 09/14/17 0512  WBC 3.8* 2.6*  HGB 12.6* 10.7*  HCT 36.9* 32.6*  PLT 84* 78*   BMET Recent Labs    09/13/17 1146 09/14/17 0512  NA 138 139  K 4.1 3.7  CL 110 114*  CO2 23 22  GLUCOSE 131* 74  BUN 9 9  CREATININE 1.26* 1.14  CALCIUM 8.4* 7.9*   LFT Recent Labs    09/14/17 0512  PROT 5.7*  ALBUMIN 2.4*  AST 84*  ALT 35  ALKPHOS 151*  BILITOT 2.0*   PT/INR Recent Labs    09/13/17 1146 09/14/17 0512  LABPROT 15.6* 16.9*  INR 1.25 1.38    Studies/Results: Dg Chest 2 View  Result Date: 09/13/2017 CLINICAL DATA:  Fever and cough.  Right rib pain. EXAM: CHEST  2 VIEW COMPARISON:  07/14/2017.  03/22/2017. FINDINGS: Lungs are  hyperexpanded. Interstitial markings are diffusely coarsened with chronic features. The lungs are clear without focal pneumonia, edema, pneumothorax or pleural effusion. Right hilar fullness is similar to prior studies and likely represents vascular anatomy. The visualized bony structures of the thorax are intact. IMPRESSION: Stable.  No acute findings. Electronically Signed   By: Misty Stanley M.D.   On: 09/13/2017 12:55   Ct Chest W Contrast  Result Date: 09/13/2017 CLINICAL DATA:  Cough, back pain. EXAM: CT CHEST, ABDOMEN, AND PELVIS WITH CONTRAST TECHNIQUE: Multidetector CT imaging of the chest, abdomen and pelvis was performed following the standard protocol during bolus administration of intravenous contrast. CONTRAST:  172mL ISOVUE-300 IOPAMIDOL (ISOVUE-300) INJECTION 61% COMPARISON:  CT scan of February 16, 2006. FINDINGS: CT CHEST FINDINGS Cardiovascular: Atherosclerosis of thoracic aorta is noted without aneurysm or dissection. Coronary artery calcifications are noted. No pericardial effusion is noted. Normal cardiac size. Mediastinum/Nodes: No enlarged mediastinal, hilar, or axillary lymph nodes. Thyroid gland, trachea, and esophagus demonstrate no significant findings. Lungs/Pleura: Lungs are clear. No pleural effusion or pneumothorax. Musculoskeletal: No chest wall mass or suspicious bone lesions identified. CT ABDOMEN PELVIS FINDINGS Hepatobiliary: Heterogeneous appearance and nodular hepatic contours are noted consistent with hepatic cirrhosis. 4.1 x 3.8 cm low density is noted in left hepatic lobe concerning for possible neoplasm. Status post cholecystectomy. Pancreas: Unremarkable. No pancreatic ductal dilatation or surrounding inflammatory changes. Spleen: Mild splenomegaly is noted. Adrenals/Urinary Tract: Adrenal glands are unremarkable. Kidneys are normal, without renal calculi, focal lesion, or hydronephrosis. Bladder is unremarkable. Stomach/Bowel: Stomach is within normal limits. Appendix  appears normal. No evidence of bowel wall thickening, distention, or inflammatory changes. Vascular/Lymphatic: Aortic atherosclerosis. No enlarged abdominal or pelvic lymph nodes. Reproductive: Prostate is unremarkable. Other: There appears to be portal venous thrombosis most likely due to hepatic cirrhosis. Collateral veins are seen around the spleen and pancreas as well as  patent umbilical vein along the anterior abdominal wall. Musculoskeletal: No acute or significant osseous findings. IMPRESSION: Hepatic cirrhosis is noted with mild splenomegaly and enlarged collateral veins consistent with portal hypertension. No contrast is seen in the main portal vein concerning for portal venous thrombosis. Doppler ultrasound may be performed for further evaluation. Aortic atherosclerosis. 4.1 x 3.8 cm low density is noted in left hepatic lobe which may represent hepatic cirrhosis, but hepatocellular carcinoma cannot be excluded. Further evaluation with MRI with and without gadolinium is recommended on nonemergent basis when patient can hold still and follow-up breathing instructions for the MRI. Electronically Signed   By: Marijo Conception, M.D.   On: 09/13/2017 16:09   Ct Abdomen Pelvis W Contrast  Result Date: 09/13/2017 CLINICAL DATA:  Cough, back pain. EXAM: CT CHEST, ABDOMEN, AND PELVIS WITH CONTRAST TECHNIQUE: Multidetector CT imaging of the chest, abdomen and pelvis was performed following the standard protocol during bolus administration of intravenous contrast. CONTRAST:  120mL ISOVUE-300 IOPAMIDOL (ISOVUE-300) INJECTION 61% COMPARISON:  CT scan of February 16, 2006. FINDINGS: CT CHEST FINDINGS Cardiovascular: Atherosclerosis of thoracic aorta is noted without aneurysm or dissection. Coronary artery calcifications are noted. No pericardial effusion is noted. Normal cardiac size. Mediastinum/Nodes: No enlarged mediastinal, hilar, or axillary lymph nodes. Thyroid gland, trachea, and esophagus demonstrate no  significant findings. Lungs/Pleura: Lungs are clear. No pleural effusion or pneumothorax. Musculoskeletal: No chest wall mass or suspicious bone lesions identified. CT ABDOMEN PELVIS FINDINGS Hepatobiliary: Heterogeneous appearance and nodular hepatic contours are noted consistent with hepatic cirrhosis. 4.1 x 3.8 cm low density is noted in left hepatic lobe concerning for possible neoplasm. Status post cholecystectomy. Pancreas: Unremarkable. No pancreatic ductal dilatation or surrounding inflammatory changes. Spleen: Mild splenomegaly is noted. Adrenals/Urinary Tract: Adrenal glands are unremarkable. Kidneys are normal, without renal calculi, focal lesion, or hydronephrosis. Bladder is unremarkable. Stomach/Bowel: Stomach is within normal limits. Appendix appears normal. No evidence of bowel wall thickening, distention, or inflammatory changes. Vascular/Lymphatic: Aortic atherosclerosis. No enlarged abdominal or pelvic lymph nodes. Reproductive: Prostate is unremarkable. Other: There appears to be portal venous thrombosis most likely due to hepatic cirrhosis. Collateral veins are seen around the spleen and pancreas as well as patent umbilical vein along the anterior abdominal wall. Musculoskeletal: No acute or significant osseous findings. IMPRESSION: Hepatic cirrhosis is noted with mild splenomegaly and enlarged collateral veins consistent with portal hypertension. No contrast is seen in the main portal vein concerning for portal venous thrombosis. Doppler ultrasound may be performed for further evaluation. Aortic atherosclerosis. 4.1 x 3.8 cm low density is noted in left hepatic lobe which may represent hepatic cirrhosis, but hepatocellular carcinoma cannot be excluded. Further evaluation with MRI with and without gadolinium is recommended on nonemergent basis when patient can hold still and follow-up breathing instructions for the MRI. Electronically Signed   By: Marijo Conception, M.D.   On: 09/13/2017 16:09    US Liver Doppler  Result Date: 09/14/2017 CLINICAL DATA:  Portal hypertension EXAM: DUPLEX ULTRASOUND OF LIVER TECHNIQUE: Color and duplex Doppler ultrasound was performed to evaluate the hepatic in-flow and out-flow vessels. COMPARISON:  None. FINDINGS: Portal Vein Velocities Thrombosed. Hepatic Vein Velocities Right:  11.8 cm/sec Middle:  32.0 cm/sec Left:  25.4 cm/sec Hepatic Artery Velocity:  207 cm/sec Splenic Vein Velocity:  13.4 cm/sec Varices: Varices are not clearly visualized on this study. Varices are present on the accompanying CT from yesterday. Ascites: A small amount of ascites is present about the liver. The portal vein is hypoechoic. Color  Doppler imaging confirms thrombosis. Right and left portal vein branches are also thrombosed. Varices noted on the accompanying CT are not clearly visualized on this study. The liver is diffusely heterogeneous compatible with diffuse hepatic parenchymal disease. Hepatic veins are patent with hepato fugal directionality of flow. IMPRESSION: Portal vein thrombosis. The patient has known portal vein thrombosis. Hepatic and splenic veins are patent. The liver is diffusely heterogeneous compatible with cirrhosis. Small amount of ascites. Electronically Signed   By: Marybelle Killings M.D.   On: 09/14/2017 11:04    Impression:  1.  Cirrhosis, alcohol etiology.  Current MELD 14, Child's B. 2.  Alcohol abuse, abstinent for 2 months. 3.  Liver mass.  Concern for hepatoma; CT non-diagnostic. 4.  Portal vein thrombosis (tumor- versus cirrhosis-related?), appears chronic given extensive collaterals on imaging studies.   4.  Abdominal pain and fevers.  No evidence of biliary obstruction and patient has had prior cholecystectomy; patient has no significant ascites thus doubt SBP; patient's symptoms are consistent with pyelphlebitis but this is an extremely rare entity.  Plan:  1.  Antibiotics and awaiting cultures. 2.  MRI and AFP to better characterize liver  mass. 3.  OK to advance diet as tolerated. 4.  Case discussed with primary team. 5.  [facility] will follow.   LOS: 1 day   Pedrohenrique Mcconville M  09/14/2017, 2:28 PM  Cell [phone] If no answer or after 5 PM call [phone]

## 2017-09-14 NOTE — Plan of Care (Signed)
  No Outcome Acute Rehab PT Goals(only PT should resolve) Pt Will Ambulate 09/14/2017 1328 by Judee Clara, Student-PT Flowsheets Taken 09/14/2017 1148  Pt will Ambulate > 125 feet;Independently Note With SpO2 above 92% 09/14/2017 1327 by Judee Clara, Student-PT Flowsheets Taken 09/14/2017 1148  Pt will Ambulate > 125 feet;Independently Note With SpO2 above 92 09/14/2017 1148 by Judee Clara, Student-PT Flowsheets Taken 09/14/2017 1148  Pt will Ambulate > 125 feet;Independently Pt Will Go Up/Down Stairs 09/14/2017 1148 by Judee Clara, Student-PT Flowsheets Taken 09/14/2017 1148  Pt will Go Up / Down Stairs Flight;with supervision

## 2017-09-14 NOTE — Progress Notes (Signed)
Family Medicine Teaching Service Daily Progress Note Intern Pager: [phone]  Patient name: Patrick [name] Medical record number: [id number] Date of birth: 02-06-1951 Age: 66 y.o. Gender: male  Primary Care Provider: Dettinger, Fransisca Kaufmann, MD Consultants: GI Code Status: Full   Assessment and Plan: Patrick [name] is a 66 y.o. male presenting with thoracic back pain and RUQ abdominal pain with commitant nausea, vomiting, and fever. PMH is significant for liver cirrhosis, alcoholism, hepatitis C, bipolar disorder, ?COPD, and tobacco use.    Right upper quadrant pain/nausea/vomiting: Vitals stable, temp 101.6 on admission but afebrile since. Clinically well appearing. Low suspicion for sepsis. RUQ pain is chronic, pleuritic in nature, likely secondary to liver capsule irritation, portal vein thrombosis, vs increase due to recent coughing. Some concern for SBP given recent fever and history of cirrhosis, however unlikely with little ascites present and minimal tenderness only to RUQ as well as no scleral icterus or additional stigmata of chronic liver disease. No biliary findings with normal appearing appendix and pancreas on CT abdomen. H/o cholecystectomy. He had a lactic acidosis to 2.41 on arrival likely due to dehydration that has since resolved s/p 2.5L fluids. Exam not suggestive for mesenteric ischemia. Seen by GI who recommend MRI liver to better characterize mass.  -Continue Day 2 Zosyn. Received Vancomycin in the ED.  -GI following; appreciate assistance -Blood cultures show no growth <24 hrs   -Manage liver cirrhosis as below -KVO  Thoracic back pain: Tenderness to right paraspinal musculature around T3-6 suggestive for musculoskeletal etiology. States he lifted several cases of water bottles recently.  -K pad -Start Flexeril '5mg'$  TID as needed -Tylenol 650 mg every 8 hours, limit to 2g per 24 hours   -Fentanyl 25 mcg every 4 hours as needed  Non-productive cough, resolved: Likely  secondary to URI, 2 week history. Flu swab negative. He has no respiratory distress with no coughing since admission.  Lung exam within normal limits.  CXR without acute finding.  -Symptomatic therapy with Mucinex  -Covered with Zosyn for infectious etiology although this is unlikely  Transaminases/elevated alk phos: Likely sequelae of previous alcoholism, 2:1 AST/ALT elevation (84:35). States he has been sober for the last 2 months. -GGT elevated, also pointing towards liver pathology -CIWA protocol, score of 0-1 during this admission   Liver cirrhosis: patient with history of alcohol abuse and hepatitis C. CT abdomen confirms liver cirrhosis.  He has no notable ascites on exam but portal hypertension on CT. No confusion or asterixis. Still with fair synthetic functions.  -Continue lactulose 3 times daily, 2-3 BM/day -Ammonia level elevated at 72, however previously values ranging from 80-120 -Liver MRI and AFP per GI to characterize mass  Portal thrombosis: Noted on CT abdomen 11/5 and confirmed with liver doppler today.  -Follow-up with GI recs, will discuss possibility of anti-coagulation treatment options given his thrombocytopenia   Liver mass: A 4.1x 3.8 cm liver mass concerning for malignancy noted on CT abdomen.  This is concerning for [place] vs GI mets.  There with 2 intrahepatic lesions noted on CT abdomen in 06/2016.  No mention about the size and location on the CT abdomen. Mass is most likely chronic given extensive collaterals seen on imaging.  -MRI of liver -Local Hepatologist referral upon discharge for follow up outpatient   Heart murmur: Noted 2/6 systolic murmur throughout. Reports history of murmur since he was a child.  Echocardiogram in 06/2016 with severe right to left shunt, otherwise normal. -Consider outpatient cardiology referral for further evaluation  Thrombocytopenia: Platelet 78.  Baseline 60s-70s.  Likely due to underlying liver cirrhosis/splenomegaly -Daily  CBC -Avoid anticoagulations  AKI, improving: Baseline appears to be 0.9-1.0.  Likely prerenal in setting of dehydration from emesis, received 2.5L fluids in the ED yesterday.  -Cr 1.14 today, 1.26 on admission  -Repeat BMP in the morning  Bipolar disorder/depression/alcohol induced mood disorder: On Lamictal at home.  Reports good compliance with this medication.  -Continue home Lamictal  History of COPD: No PFT in the chart.  Not on any medications at home.  No respiratory distress.  Lung exam within normal limits. -Albuterol as needed  History of alcohol use: States that he is sober for the last 2 months.  Now living at [facility] for recovery.  -CIWA score of 0-1 thus far, will monitor  -CSW consulted, appreciate recommendations   Tobacco use: Reports smoking about 7 cigarettes a day. Declines nicotine patch. -Offered nicotine patch when needed  FEN/GI: -Low-sodium diet -Lactulose as above  Prophylaxis: SCD given history of thrombocytopenia  Disposition: Inpatient service, awaiting GI evaluation.     Subjective:  No acute events overnight. Main complaint is his throbbing right thoracic back pain, 7/10. RUQ pain is worse with deep inhalation. No nausea, vomiting, or coughing since admission. 1 BM today.   Objective: Temp:  [97.8 F (36.6 C)-101.6 F (38.7 C)] 98 F (36.7 C) (11/06 0600) Pulse Rate:  [65-94] 68 (11/06 0600) Resp:  [12-20] 18 (11/06 0600) BP: (101-143)/(55-91) 108/55 (11/06 0600) SpO2:  [89 %-97 %] 93 % (11/06 0600) Weight:  [200 lb 13.4 oz (91.1 kg)] 200 lb 13.4 oz (91.1 kg) (11/05 1931) Physical Exam: General: Alert and oriented x4, no acute distress. Cardiovascular: RRR, 2+ systolic murmur noted best heard on left sternal border. No tenderness to anterior chest or lower ribs.  Respiratory: CTA bilaterally without wheezing, rales, or rhonchi.  Abdomen: Normoactive BS x 4, soft, non-distended abdomen. RUQ tenderness under ribs. No fluid wave.   Extremities: Warm, dry. 2+ radial and dorsalis pedis pulses. No LE edema. Tenderness to right paraspinal muscles around T3-6. No bruising or deformities noted.   Laboratory: Recent Labs  Lab 09/13/17 1146 09/14/17 0512  WBC 3.8* 2.6*  HGB 12.6* 10.7*  HCT 36.9* 32.6*  PLT 84* 78*   Recent Labs  Lab 09/13/17 1146 09/14/17 0512  NA 138 139  K 4.1 3.7  CL 110 114*  CO2 23 22  BUN 9 9  CREATININE 1.26* 1.14  CALCIUM 8.4* 7.9*  PROT 6.3* 5.7*  BILITOT 1.8* 2.0*  ALKPHOS 216* 151*  ALT 33 35  AST 76* 84*  GLUCOSE 131* 74    Imaging/Diagnostic Tests: Ct Chest W Contrast  Result Date: 09/13/2017 CLINICAL DATA:  Cough, back pain. EXAM: CT CHEST, ABDOMEN, AND PELVIS WITH CONTRAST TECHNIQUE: Multidetector CT imaging of the chest, abdomen and pelvis was performed following the standard protocol during bolus administration of intravenous contrast. CONTRAST:  133m ISOVUE-300 IOPAMIDOL (ISOVUE-300) INJECTION 61% COMPARISON:  CT scan of February 16, 2006. FINDINGS: CT CHEST FINDINGS Cardiovascular: Atherosclerosis of thoracic aorta is noted without aneurysm or dissection. Coronary artery calcifications are noted. No pericardial effusion is noted. Normal cardiac size. Mediastinum/Nodes: No enlarged mediastinal, hilar, or axillary lymph nodes. Thyroid gland, trachea, and esophagus demonstrate no significant findings. Lungs/Pleura: Lungs are clear. No pleural effusion or pneumothorax. Musculoskeletal: No chest wall mass or suspicious bone lesions identified. CT ABDOMEN PELVIS FINDINGS Hepatobiliary: Heterogeneous appearance and nodular hepatic contours are noted consistent with hepatic cirrhosis. 4.1 x 3.8  cm low density is noted in left hepatic lobe concerning for possible neoplasm. Status post cholecystectomy. Pancreas: Unremarkable. No pancreatic ductal dilatation or surrounding inflammatory changes. Spleen: Mild splenomegaly is noted. Adrenals/Urinary Tract: Adrenal glands are unremarkable.  Kidneys are normal, without renal calculi, focal lesion, or hydronephrosis. Bladder is unremarkable. Stomach/Bowel: Stomach is within normal limits. Appendix appears normal. No evidence of bowel wall thickening, distention, or inflammatory changes. Vascular/Lymphatic: Aortic atherosclerosis. No enlarged abdominal or pelvic lymph nodes. Reproductive: Prostate is unremarkable. Other: There appears to be portal venous thrombosis most likely due to hepatic cirrhosis. Collateral veins are seen around the spleen and pancreas as well as patent umbilical vein along the anterior abdominal wall. Musculoskeletal: No acute or significant osseous findings. IMPRESSION: Hepatic cirrhosis is noted with mild splenomegaly and enlarged collateral veins consistent with portal hypertension. No contrast is seen in the main portal vein concerning for portal venous thrombosis. Doppler ultrasound may be performed for further evaluation. Aortic atherosclerosis. 4.1 x 3.8 cm low density is noted in left hepatic lobe which may represent hepatic cirrhosis, but hepatocellular carcinoma cannot be excluded. Further evaluation with MRI with and without gadolinium is recommended on nonemergent basis when patient can hold still and follow-up breathing instructions for the MRI. Electronically Signed   By: Marijo Conception, M.D.   On: 09/13/2017 16:09   Ct Abdomen Pelvis W Contrast  Result Date: 09/13/2017 CLINICAL DATA:  Cough, back pain. EXAM: CT CHEST, ABDOMEN, AND PELVIS WITH CONTRAST TECHNIQUE: Multidetector CT imaging of the chest, abdomen and pelvis was performed following the standard protocol during bolus administration of intravenous contrast. CONTRAST:  123m ISOVUE-300 IOPAMIDOL (ISOVUE-300) INJECTION 61% COMPARISON:  CT scan of February 16, 2006. FINDINGS: CT CHEST FINDINGS Cardiovascular: Atherosclerosis of thoracic aorta is noted without aneurysm or dissection. Coronary artery calcifications are noted. No pericardial effusion is noted.  Normal cardiac size. Mediastinum/Nodes: No enlarged mediastinal, hilar, or axillary lymph nodes. Thyroid gland, trachea, and esophagus demonstrate no significant findings. Lungs/Pleura: Lungs are clear. No pleural effusion or pneumothorax. Musculoskeletal: No chest wall mass or suspicious bone lesions identified. CT ABDOMEN PELVIS FINDINGS Hepatobiliary: Heterogeneous appearance and nodular hepatic contours are noted consistent with hepatic cirrhosis. 4.1 x 3.8 cm low density is noted in left hepatic lobe concerning for possible neoplasm. Status post cholecystectomy. Pancreas: Unremarkable. No pancreatic ductal dilatation or surrounding inflammatory changes. Spleen: Mild splenomegaly is noted. Adrenals/Urinary Tract: Adrenal glands are unremarkable. Kidneys are normal, without renal calculi, focal lesion, or hydronephrosis. Bladder is unremarkable. Stomach/Bowel: Stomach is within normal limits. Appendix appears normal. No evidence of bowel wall thickening, distention, or inflammatory changes. Vascular/Lymphatic: Aortic atherosclerosis. No enlarged abdominal or pelvic lymph nodes. Reproductive: Prostate is unremarkable. Other: There appears to be portal venous thrombosis most likely due to hepatic cirrhosis. Collateral veins are seen around the spleen and pancreas as well as patent umbilical vein along the anterior abdominal wall. Musculoskeletal: No acute or significant osseous findings. IMPRESSION: Hepatic cirrhosis is noted with mild splenomegaly and enlarged collateral veins consistent with portal hypertension. No contrast is seen in the main portal vein concerning for portal venous thrombosis. Doppler ultrasound may be performed for further evaluation. Aortic atherosclerosis. 4.1 x 3.8 cm low density is noted in left hepatic lobe which may represent hepatic cirrhosis, but hepatocellular carcinoma cannot be excluded. Further evaluation with MRI with and without gadolinium is recommended on nonemergent basis when  patient can hold still and follow-up breathing instructions for the MRI. Electronically Signed   By: JMarijo Conception M.D.  On: 09/13/2017 16:09   US Liver Doppler  Result Date: 09/14/2017 CLINICAL DATA:  Portal hypertension EXAM: DUPLEX ULTRASOUND OF LIVER TECHNIQUE: Color and duplex Doppler ultrasound was performed to evaluate the hepatic in-flow and out-flow vessels. COMPARISON:  None. FINDINGS: Portal Vein Velocities Thrombosed. Hepatic Vein Velocities Right:  11.8 cm/sec Middle:  32.0 cm/sec Left:  25.4 cm/sec Hepatic Artery Velocity:  207 cm/sec Splenic Vein Velocity:  13.4 cm/sec Varices: Varices are not clearly visualized on this study. Varices are present on the accompanying CT from yesterday. Ascites: A small amount of ascites is present about the liver. The portal vein is hypoechoic. Color Doppler imaging confirms thrombosis. Right and left portal vein branches are also thrombosed. Varices noted on the accompanying CT are not clearly visualized on this study. The liver is diffusely heterogeneous compatible with diffuse hepatic parenchymal disease. Hepatic veins are patent with hepato fugal directionality of flow. IMPRESSION: Portal vein thrombosis. The patient has known portal vein thrombosis. Hepatic and splenic veins are patent. The liver is diffusely heterogeneous compatible with cirrhosis. Small amount of ascites. Electronically Signed   By: Marybelle Killings M.D.   On: 09/14/2017 11:04    Nickola Major, Medical Student 09/14/2017, 7:17 AM [place] Intern pager: [phone], text pages welcome   Patient seen with medical student. Agree with her documentation above and made necessary edits.  Rosana Berger, MD [place], PGY2

## 2017-09-15 ENCOUNTER — Encounter (HOSPITAL_COMMUNITY): Payer: Self-pay

## 2017-09-15 ENCOUNTER — Inpatient Hospital Stay (HOSPITAL_COMMUNITY): Payer: Medicare Other

## 2017-09-15 DIAGNOSIS — C220 Liver cell carcinoma: Secondary | ICD-10-CM

## 2017-09-15 LAB — COMPREHENSIVE METABOLIC PANEL
ALT: 33 U/L (ref 17–63)
AST: 87 U/L — AB (ref 15–41)
Albumin: 2.5 g/dL — ABNORMAL LOW (ref 3.5–5.0)
Alkaline Phosphatase: 165 U/L — ABNORMAL HIGH (ref 38–126)
Anion gap: 4 — ABNORMAL LOW (ref 5–15)
BILIRUBIN TOTAL: 2 mg/dL — AB (ref 0.3–1.2)
BUN: 9 mg/dL (ref 6–20)
CALCIUM: 8 mg/dL — AB (ref 8.9–10.3)
CHLORIDE: 112 mmol/L — AB (ref 101–111)
CO2: 21 mmol/L — ABNORMAL LOW (ref 22–32)
CREATININE: 1.2 mg/dL (ref 0.61–1.24)
GFR calc non Af Amer: >60 mL/min (ref >60)
Glucose, Bld: 124 mg/dL — ABNORMAL HIGH (ref 65–99)
Potassium: 4.1 mmol/L (ref 3.5–5.1)
Sodium: 137 mmol/L (ref 135–145)
TOTAL PROTEIN: 6.1 g/dL — AB (ref 6.5–8.1)

## 2017-09-15 LAB — CBC
HEMATOCRIT: 35.2 % — AB (ref 39.0–52.0)
Hemoglobin: 11.9 g/dL — ABNORMAL LOW (ref 13.0–17.0)
MCH: 28.2 pg (ref 26.0–34.0)
MCHC: 33.8 g/dL (ref 30.0–36.0)
MCV: 83.4 fL (ref 78.0–100.0)
PLATELETS: 77 10*3/uL — AB (ref 150–400)
RBC: 4.22 MIL/uL (ref 4.22–5.81)
RDW: 19.1 % — ABNORMAL HIGH (ref 11.5–15.5)
WBC: 4 10*3/uL (ref 4.0–10.5)

## 2017-09-15 LAB — AFP TUMOR MARKER: AFP, SERUM, TUMOR MARKER: 36749 ng/mL — AB (ref 0.0–8.3)

## 2017-09-15 MED ORDER — IPRATROPIUM BROMIDE 0.02 % IN SOLN: 0.5000 mg | Freq: Four times a day (QID) | RESPIRATORY_TRACT | Status: DC | PRN

## 2017-09-15 MED ORDER — GADOBENATE DIMEGLUMINE 529 MG/ML IV SOLN
19.0000 mL | Freq: Once | INTRAVENOUS | Status: AC | PRN
  Administered 2017-09-15: 19 mL via INTRAVENOUS

## 2017-09-15 NOTE — Progress Notes (Signed)
Occupational Therapy Treatment Patient Details Name: Patrick [name] MRN: [id number] DOB: October 08, 1951 Today's Date: 09/15/2017    History of present illness Pt is 66 y.o. male admitted to hospital with c/o back pain with coughing and dizzy spells. Pt had syncopal episode during hospital stay. Chest CT - aortic atherosclerosis and hepatic cirrhosis. PMH includes COPD, stroke, depression, cirrhosis of liver, alcohol and tobacco abuse, and bipolar disorder.   OT comments  Pt making progress towards OT goals this session. Pt does continue to have O2 saturation decline with functional mobility. Pursed lip breathing performed and 1 L O2 applied to Pt via [place] with oxygen returning to 94%. RN notified. Pt performed functional tasks with close supervision for safety and min verbal cues for safety awareness. OT providing education regarding energy conservation with self care tasks. Education to continue. Pt continues to benefit from acute OT intervention.   Follow Up Recommendations  No OT follow up;Supervision/Assistance - 24 hour    Equipment Recommendations  None recommended by OT    Recommendations for Other Services      Precautions / Restrictions Precautions Precautions: Fall Restrictions Weight Bearing Restrictions: No       Mobility Bed Mobility Overal bed mobility: Modified Independent Bed Mobility: Supine to Sit     Supine to sit: Modified independent (Device/Increase time)     General bed mobility comments: Increased time  Transfers Overall transfer level: Needs assistance Equipment used: None Transfers: Sit to/from Stand Sit to Stand: Supervision   General transfer comment: supervision with min cues for safety    Balance Overall balance assessment: Needs assistance Sitting-balance support: No upper extremity supported;Feet supported Sitting balance-Leahy Scale: Good Sitting balance - Comments: Pt demonstrates ability to sit EOB without external support.   Standing  balance support: No upper extremity supported;During functional activity Standing balance-Leahy Scale: Good Standing balance comment: pt standing for grooming tasks without UE support with supervision for safety       ADL either performed or assessed with clinical judgement   ADL Overall ADL's : Needs assistance/impaired     Grooming: Set up;Supervision/safety;Standing;Wash/dry hands;Wash/dry face;Oral care Grooming Details (indicate cue type and reason): Pt performing grooming tasks while standing with supervision only at sink for safety.     Functional mobility during ADLs: Supervision/safety;Cueing for safety General ADL Comments: Pt ambulating and standing for functional tasks while holding onto IV as pt is receiving meds at this time. Pt ambulating 100' with close supervision for safety without use of AD. O2 dropping to 81% on room air. Pt able to recover to 90% after 1 min of pursed lip breathing. RN notified and OT applied 1 L O2 via [place]     Vision Baseline Vision/History: Wears glasses Wears Glasses: Reading only Patient Visual Report: No change from baseline            Cognition Arousal/Alertness: Awake/alert Behavior During Therapy: WFL for tasks assessed/performed Overall Cognitive Status: Within Functional Limits for tasks assessed                      Pertinent Vitals/ Pain       Pain Assessment: No/denies pain         Frequency  Min 2X/week        Progress Toward Goals  OT Goals(current goals can now be found in the care plan section)  Progress towards OT goals: Progressing toward goals  Acute Rehab OT Goals Patient Stated Goal: return to group home OT Goal Formulation: With  patient Time For Goal Achievement: 09/29/17 Potential to Achieve Goals: Good  Plan         AM-PAC PT "6 Clicks" Daily Activity     Outcome Measure   Help from another person eating meals?: None Help from another person taking care of personal grooming?: A  Little Help from another person toileting, which includes using toliet, bedpan, or urinal?: A Little Help from another person bathing (including washing, rinsing, drying)?: A Little Help from another person to put on and taking off regular upper body clothing?: A Little Help from another person to put on and taking off regular lower body clothing?: A Little 6 Click Score: 19    End of Session Equipment Utilized During Treatment: Oxygen;Other (comment)(O2 dropping on room air and 1 L O2 applied via [place])  OT Visit Diagnosis: Unsteadiness on feet (R26.81);Other abnormalities of gait and mobility (R26.89);Muscle weakness (generalized) (M62.81)   Activity Tolerance Patient tolerated treatment well;Patient limited by fatigue   Patient Left in bed;with call bell/phone within reach;with bed alarm set   Nurse Communication Mobility status;Other (comment)(O2 needs)        Time: 7824-2353 OT Time Calculation (min): 25 min  Charges: OT General Charges $OT Visit: 1 Visit OT Treatments $Self Care/Home Management : 8-22 mins $Therapeutic Activity: 8-22 mins    Patrick [name] P, MS, OTR/L, CBIS 09/15/2017, 10:02 AM

## 2017-09-15 NOTE — Progress Notes (Signed)
Physical Therapy Treatment Patient Details Name: Patrick [name] MRN: [id number] DOB: [birth year] Today's Date: 09/15/2017    History of Present Illness Pt is 66 y.o. male admitted to hospital with c/o back pain with coughing and dizzy spells. Pt had syncopal episode during hospital stay. Chest CT - aortic atherosclerosis and hepatic cirrhosis. PMH includes COPD, stroke, depression, cirrhosis of liver, alcohol and tobacco abuse, and bipolar disorder.    PT Comments    Pt functioning a mod I/supervision level. Pt denies SOB and states "I feel fine" during amb/stair negotiation on RA but SpO2 with noted drop. See below. Acute PT to con't to follow.  SATURATION QUALIFICATIONS: (This note is used to comply with regulatory documentation for home oxygen)  Patient Saturations on Room Air at Rest = 91%  Patient Saturations on Room Air while Ambulating = dropped to 76%  Patient Saturations on 1 Liters of oxygen while Ambulating = dropped to 82%  Please briefly explain why patient needs home oxygen: unable to maintain SpO2 >88% on RA.   Follow Up Recommendations  No PT follow up     Equipment Recommendations  None recommended by PT    Recommendations for Other Services       Precautions / Restrictions Precautions Precautions: Fall Restrictions Weight Bearing Restrictions: No    Mobility  Bed Mobility Overal bed mobility: Modified Independent Bed Mobility: Supine to Sit     Supine to sit: Modified independent (Device/Increase time)     General bed mobility comments: increased time, used bed rail  Transfers Overall transfer level: Needs assistance Equipment used: None Transfers: Sit to/from Stand Sit to Stand: Supervision         General transfer comment: pt with good technique  Ambulation/Gait Ambulation/Gait assistance: Supervision Ambulation Distance (Feet): 200 Feet Assistive device: None Gait Pattern/deviations: WFL(Within Functional Limits) Gait velocity:  wfl Gait velocity interpretation: at or above normal speed for age/gender General Gait Details: Pt's SpO2 dropped to 76% on RA, pt then amb with 1Lo2 via [place] and dropped into low 80s   Stairs Stairs: Yes   Stair Management: One rail Right;Alternating pattern;Forwards Number of Stairs: 4(limited by IV pole) General stair comments: noted SOB, pt reports "I feel fine"  Wheelchair Mobility    Modified Rankin (Stroke Patients Only)       Balance Overall balance assessment: Needs assistance Sitting-balance support: No upper extremity supported Sitting balance-Leahy Scale: Good     Standing balance support: No upper extremity supported Standing balance-Leahy Scale: Good                              Cognition Arousal/Alertness: Awake/alert Behavior During Therapy: WFL for tasks assessed/performed Overall Cognitive Status: Within Functional Limits for tasks assessed                                        Exercises      General Comments        Pertinent Vitals/Pain Pain Assessment: No/denies pain    Home Living                      Prior Function            PT Goals (current goals can now be found in the care plan section) Progress towards PT goals: Progressing toward goals    Frequency  Min 3X/week      PT Plan Current plan remains appropriate    Co-evaluation              AM-PAC PT "6 Clicks" Daily Activity  Outcome Measure  Difficulty turning over in bed (including adjusting bedclothes, sheets and blankets)?: None Difficulty moving from lying on back to sitting on the side of the bed? : None Difficulty sitting down on and standing up from a chair with arms (e.g., wheelchair, bedside commode, etc,.)?: None Help needed moving to and from a bed to chair (including a wheelchair)?: None Help needed walking in hospital room?: None Help needed climbing 3-5 steps with a railing? : A Little 6 Click Score: 23     End of Session Equipment Utilized During Treatment: Gait belt Activity Tolerance: Patient tolerated treatment well Patient left: in bed;with call bell/phone within reach;with bed alarm set;Other (comment) Nurse Communication: (SpO2 saturations) PT Visit Diagnosis: Difficulty in walking, not elsewhere classified (R26.2)     Time: 0459-9774 PT Time Calculation (min) (ACUTE ONLY): 20 min  Charges:  $Gait Training: 8-22 mins                    G Codes:       Kittie Plater, PT, DPT Pager #: [phone] Office #: [phone]   [place] 09/15/2017, 2:06 PM

## 2017-09-15 NOTE — Progress Notes (Signed)
Subjective: Abdominal pain improving.  Objective: Vital signs in last 24 hours: Temp:  [98 F (36.7 C)-98.4 F (36.9 C)] 98.3 F (36.8 C) (11/07 0432) Pulse Rate:  [68-81] 81 (11/07 0432) Resp:  [14-18] 14 (11/07 0432) BP: (95-113)/(51-62) 95/51 (11/07 0432) SpO2:  [90 %-95 %] 90 % (11/07 0432) Weight change:  Last BM Date: 09/12/17  PE: GEN:  NAD, chronically ill-appearing, older-appearing than stated age ABD:  Soft, mild epigastric and right upper quadrant tenderness; firm nodular liver edge few cm below right costal margin  Lab Results: AFP 36,000 CBC    Component Value Date/Time   WBC 4.0 09/15/2017 0902   RBC 4.22 09/15/2017 0902   HGB 11.9 (L) 09/15/2017 0902   HGB 13.5 07/08/2017 1034   HCT 35.2 (L) 09/15/2017 0902   HCT 41.1 07/08/2017 1034   PLT 77 (L) 09/15/2017 0902   PLT 71 (LL) 07/08/2017 1034   MCV 83.4 09/15/2017 0902   MCV 84 07/08/2017 1034   MCH 28.2 09/15/2017 0902   MCHC 33.8 09/15/2017 0902   RDW 19.1 (H) 09/15/2017 0902   RDW 18.9 (H) 07/08/2017 1034   LYMPHSABS 0.9 09/13/2017 1146   LYMPHSABS 0.9 07/08/2017 1034   MONOABS 0.5 09/13/2017 1146   EOSABS 0.1 09/13/2017 1146   EOSABS 0.1 07/08/2017 1034   BASOSABS 0.0 09/13/2017 1146   BASOSABS 0.0 07/08/2017 1034   CMP     Component Value Date/Time   NA 137 09/15/2017 0902   NA 139 07/08/2017 1034   K 4.1 09/15/2017 0902   CL 112 (H) 09/15/2017 0902   CO2 21 (L) 09/15/2017 0902   GLUCOSE 124 (H) 09/15/2017 0902   BUN 9 09/15/2017 0902   BUN 11 07/08/2017 1034   CREATININE 1.20 09/15/2017 0902   CALCIUM 8.0 (L) 09/15/2017 0902   PROT 6.1 (L) 09/15/2017 0902   PROT 7.2 07/08/2017 1034   ALBUMIN 2.5 (L) 09/15/2017 0902   ALBUMIN 3.7 07/08/2017 1034   AST 87 (H) 09/15/2017 0902   ALT 33 09/15/2017 0902   ALKPHOS 165 (H) 09/15/2017 0902   BILITOT 2.0 (H) 09/15/2017 0902   BILITOT 1.0 07/08/2017 1034   GFRNONAA >60 09/15/2017 0902   GFRAA >60 09/15/2017 0902   Studies/Results: MRI  liver:  Large mass c/w hepatoma; portal vein thrombosis  Assessment:  1.  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 2.  Liver mass.  Imaging characteristics and AFP diagnostic of HCC. 3.  Abdominal pain.  Likely from large liver mass. 4.  Fevers.  Improving, on antibiotics.  Tumor-related versus idiopathic infection versus pyelphlebitis versus other.  Plan:  1.  Oncology consult for management options of [place].  I have called in consult.  Patient is not candidate for liver resection (+PHTN), transplant (<6 months alcohol and outside of [place] criteria), TACE (+portal vein thrombosis).  Management options would likely include sorafenib versus Y-90 radioembolization versus palliative care. 2.  No further GI work-up anticipated. 3.  [facility] will sign-off; please call with questions; thank you the consultation.   Landry Dyke 09/15/2017, 11:42 AM   Cell [phone] If no answer or after 5 PM call [phone]

## 2017-09-15 NOTE — Progress Notes (Signed)
Family Medicine Teaching Service Daily Progress Note Intern Pager: [phone]  Patient name: Patrick [name] Medical record number: [id number] Date of birth: [birth year] Age: 66 y.o. Gender: male  Primary Care Provider: Dettinger, Fransisca Kaufmann, MD Consultants: [facility] Code Status: Full   Assessment and Plan: Resean Brander Ziglaris a 66 y.o.malepresenting with thoracic back pain and RUQ abdominal pain with commitant nausea, vomiting, and fever. PMH is significant for liver cirrhosis, alcoholism, hepatitis C, bipolar disorder, ?COPD, and tobacco use.   Right upper quadrant pain in setting of liver cirrhosis with portal vein thrombosis, improving:Vitals stable, temp 101.6 on admission but afebrile since. Clinically well appearing. Low suspicion for sepsis. RUQ pain is chronic, pleuritic in nature, likely secondary to liver capsule irritation, portal vein thrombosis, vs increase due to recent coughing. SBP unlikely with little ascites present and minimal tenderness only to RUQ as well as no scleral icterus or additional stigmata of chronic liver disease. No biliary findings with normal appearing appendix and pancreas on CT abdomen. H/o cholecystectomy. He had a lactic acidosis to 2.41 on arrival likely due to dehydration that has since resolved s/p 2.5L fluids.Exam not suggestive for mesenteric ischemia. -Continue Day 3 Zosyn, d/c if 48 hour blood cultures neg (no growth @ 24 hr)  -GI following; appreciate recommendations  -Liver MRI suggestive of hepatocellular carcinoma  -Manage liver cirrhosis as below -KVO  Transaminases/elevated alk phos:Likely sequelae of previous alcoholism, 2:1 AST/ALT elevation (84:35). States he has been sober for the last 2 months. -GGT elevated, also pointing towards liver pathology -CIWA protocol, score of 0-1 during this admission   Liver cirrhosis: patient with history of alcohol abuse and hepatitis C.CT abdomen confirms liver cirrhosis.He has no notable ascites  on exam or additional clinical stigmata of liver dysfunction but portal hypertension on CT. Still with fair synthetic functions.  -Continue lactulose 3 times daily, 2-3 BM/day -Ammonia level elevated at 72, however previously values ranging from 80-120  Portal vein thrombosis:Noted on CT abdomen 11/5 and confirmed with liver doppler today.  -Considered chronic per GI given his extensive collateral flow  Liver mass:4.1x3.8 cm liver mass concerning for malignancy noted on CT abdomen.This is concerning for HCC, hepatoma vs GI mets.There were 2 poorly characterized intrahepatic lesions noted on CT abdomen in 06/2016. -GI has consulted Oncology to see patient inpatient given concern for [facility] -AFP tumor marker significantly elevated at 36,746  -Local Hepatologist referral upon discharge for follow up outpatient   Thoracic back pain, improving:Tenderness to right paraspinal musculature around T3-6 suggestive for musculoskeletal etiology. States he lifted several cases of water bottles recently. 4/10 pain scale today.  -K pad -Continue Flexeril '5mg'$  TID as needed -Tylenol 650 mg every 8 hours, limit to 2g per 24 hours  -D/c Fentanyl per patient request   Normocytic anemia: 12.6 on admission, baseline 11-12.5  -Hgb 10.7 g/dl yesterday am, likely dilutional from fluid boluses in ED on admission  -No active signs of bleeding -CBC this am   Thrombocytopenia:Stable, platelet 78. Baseline 60s-70s. Likely due to underlying liver cirrhosis/splenomegaly -Daily CBC -Avoid anticoagulations  Non-productive cough, resolved:Likely secondary to URI, 2 week history. Flu swab negative. He has no respiratory distress with no coughing since admission.Lung exam within normal limits. CXR without acute finding.  -Symptomatic therapy with Mucinex  -Covered with Zosyn for infectious etiology although this is unlikely  Heart murmur:Noted 2/6 systolic murmur throughout. Reports history of murmur  since he was a child.Echocardiogram in 06/2016 with severe right to left shunt,otherwise normal. -Consider outpatient cardiology  referral for further evaluation   AKI, improving:Baseline appears to be 0.9-1.0.Likely prerenal in setting of dehydration from emesis, received 2.5L fluids in the ED on admission.  -Cr 1.14, 1.26 on admission  -BMP this am   Bipolar disorder/depression/alcohol induced mood disorder:On Lamictal at home. Reports good compliance with this medication.  -Continue home Lamictal  History of COPD:No PFT in the chart. Not on any medications at home. No respiratory distress. Lung exam within normal limits. -Albuterol as needed  History of alcohol XLK:GMWNUU that he is sober for the last 2 months. Now living at [facility] for recovery.  -CIWA score of 0-1 thus far, will monitor  -CSW consulted, appreciate recommendations   Tobacco VOZ:DGUYQIH smoking about 7 cigarettes a day. Declines nicotine patch. -Offered nicotine patch when needed  FEN/GI: -Low-sodium diet -Lactulose as above  Prophylaxis:SCD given history of thrombocytopenia  Disposition:Pending Oncology recommendations and blood culture results, d/c back to [place]   Subjective:  Mr. Groene is doing well this morning, endorses improvement of his thoracic and RUQ pain. Pain 4/10 in his back, 7/10 yesterday. Denies SOB, coughing, nausea, or vomiting. 2 BM this am. No acute events overnight.    Objective: Temp:  [98 F (36.7 C)-98.4 F (36.9 C)] 98.3 F (36.8 C) (11/07 0432) Pulse Rate:  [68-81] 81 (11/07 0432) Resp:  [14-18] 14 (11/07 0432) BP: (95-113)/(51-62) 95/51 (11/07 0432) SpO2:  [90 %-95 %] 90 % (11/07 0432) Physical Exam: General: Alert and oriented x4, no acute distress. Cardiovascular: RRR, 2+ systolic murmur noted best heard on left sternal border. No tenderness to anterior chest or lower ribs.  Respiratory: CTA bilaterally without wheezing, rales, or  rhonchi.  Abdomen: Normoactive BS x 4, soft, non-distended abdomen. RUQ tenderness under ribs. No fluid wave.  Extremities: Warm, dry. 2+ radial and dorsalis pedis pulses. No LE edema. Tenderness to right paraspinal muscles around T3-6, improved from yesterday. No bruising or deformities noted.    Laboratory: Recent Labs  Lab 09/13/17 1146 09/14/17 0512  WBC 3.8* 2.6*  HGB 12.6* 10.7*  HCT 36.9* 32.6*  PLT 84* 78*   Recent Labs  Lab 09/13/17 1146 09/14/17 0512  NA 138 139  K 4.1 3.7  CL 110 114*  CO2 23 22  BUN 9 9  CREATININE 1.26* 1.14  CALCIUM 8.4* 7.9*  PROT 6.3* 5.7*  BILITOT 1.8* 2.0*  ALKPHOS 216* 151*  ALT 33 35  AST 76* 84*  GLUCOSE 131* 74   Imaging/Diagnostic Tests: CT abdomen and pelvis:  IMPRESSION: Hepatic cirrhosis is noted with mild splenomegaly and enlarged collateral veins consistent with portal hypertension. No contrast is seen in the main portal vein concerning for portal venous thrombosis. Doppler ultrasound may be performed for further evaluation. Aortic atherosclerosis. 4.1 x 3.8 cm low density is noted in left hepatic lobe which may represent hepatic cirrhosis, but hepatocellular carcinoma cannot be excluded. Further evaluation with MRI with and without gadolinium is recommended on nonemergent basis when patient can hold still and follow-up breathing instructions for the MRI. Electronically Signed   By: Marijo Conception, M.D.   On: 09/13/2017 16:09     Nickola Major, Medical Student 09/15/2017, 7:29 AM  [place] Intern pager: [phone], text pages welcome  Saw patient with medical student and agree with documentation as above and made necessary changes.   Olene Floss, MD [place], PGY-3

## 2017-09-16 ENCOUNTER — Other Ambulatory Visit: Payer: Self-pay | Admitting: Family Medicine

## 2017-09-16 ENCOUNTER — Other Ambulatory Visit: Payer: Self-pay | Admitting: Hematology and Oncology

## 2017-09-16 DIAGNOSIS — K7031 Alcoholic cirrhosis of liver with ascites: Secondary | ICD-10-CM

## 2017-09-16 DIAGNOSIS — C220 Liver cell carcinoma: Secondary | ICD-10-CM

## 2017-09-16 LAB — AFP TUMOR MARKER: AFP, Serum, Tumor Marker: 38564 ng/mL — ABNORMAL HIGH (ref 0.0–8.3)

## 2017-09-16 MED ORDER — ACETAMINOPHEN 325 MG PO TABS: 650.0000 mg | ORAL_TABLET | Freq: Three times a day (TID) | ORAL | Status: DC | PRN

## 2017-09-16 MED ORDER — CYCLOBENZAPRINE HCL 5 MG PO TABS: 5.0000 mg | ORAL_TABLET | Freq: Three times a day (TID) | ORAL | 0 refills | Status: AC | PRN

## 2017-09-16 NOTE — Discharge Summary (Signed)
[facility] Discharge Summary  Patient name: Patrick [name] Medical record number: [id number] Date of birth: 09-15-1951 Age: 66 y.o. Gender: male Date of Admission: 09/13/2017   Date of Discharge: 09/16/2017 Admitting Physician: Blane Ohara McDiarmid, MD  Primary Care Provider: Dettinger, Fransisca Kaufmann, MD Consultants: [facility] and Oncology   Discharge Diagnoses/Problem List:  Liver mass, probable Hepatocellular Carcinoma  Liver Cirrhosis  Portal hypertension  Chronic portal vein thrombosis  Hepatitis C s/p Harvoni treatment  ?COPD  Chronic thrombocytopenia  Chronic normocytic anemia  Hypoalbuminemia  Previous alcoholism  Systolic murmur  Bipolar disorder Tobacco use    Disposition: [facility]   Discharge Condition: Stable   Discharge Exam:  General:Alert and oriented x4, no acute distress. Cardiovascular:RRR, 2+ systolic murmur noted best heard on left sternal border. Respiratory:CTA bilaterally without wheezing, rales, or rhonchi. Abdomen:Normoactive BS x 4, soft, non-distended abdomen. Mild RUQ tenderness under ribs. No fluid wave. Extremities:Warm, dry. 2+ radial and dorsalis pedis pulses. No LE edema. Tenderness to right paraspinal muscles around T3-6, improved from yesterday. No bruising or deformities noted.   Brief Hospital Course:         Mr. Patrick [name] is a 66 year old male with a past history of liver cirrhosis with previous alcoholism and Hepatitis C, portal vein thrombosis with collateral flow, ?COPD, bipolar disorder, and tobacco use that presented on 11/5 with RUQ pain, thoracic back pain, and cough.           He presented with a dull right upper quadrant pain for the past few weeks that was pleuritic in nature with associated nausea, vomiting, and a fever of 101.6 on admission that subsequently resolved for the duration of his stay. Clinically well appearing with only mild RUQ tenderness on exam. However, initially concern for sepsis  given he was febrile with elevated lactic acid in the ED. He received 1 dose of Vancomycin in the ED and 3 days total of Zosyn that was discontinued after 48 hour blood cultures returned negative. Elevated lactic acid resolved prior to admission following 2.5L of fluids. CT abdomen on arrival showed no biliary findings (also h/o cholecystectomy) with normal appearing appendix and pancreas. SBP was considered given his cirrhosis history, however deemed unlikely given no appreciable ascites. A 4.1 x 3.8cm mass in the left hepatic lobe was found on CT concerning for neoplasm and subsequently followed with liver MRI per GI. Liver MRI completed on 11/7 was consistent with hepatocellular carcinoma and portal vein involvement. AFP found to be greatly elevated in 36-38,000's. Oncology was consulted for further discussion of treatment modalities for presumed [place]. He is not a candidate for resection or transplant, but will continuing following with oncology outpatient with a family meeting planned for next week. No nausea or vomiting during his stay, and at discharge denies continued abdominal pain unless palpated.           He also presented with right thoracic back pain and non-productive coughing for a few weeks. He admits to recently lifting heavy cases of water causing strain. His back pain was well managed with Tylenol, Flexeril, and occasional Fentanyl that was discontinued per his request. He had no coughing since admission. His lungs remained clear, with a chest X-ray and chest CT showing no infiltrates or effusions.          On 11/7, he had a brief episode of oxygen desaturation into the 80's and shortness of breath while working with PT that resolved with 2L [place] oxygen. Per  ambulating oximetry study, he was deemed appropriate for home ambulatory oxygen and will be sent home with O2.           His other chronic conditions were well managed during his stay. His known portal vein thrombosis was further  established with liver Doppler confirming appropriate collateral flow. No treatment was started for this.   Issues for Follow Up:  1. Follow up treatment/progression of probable hepatocellular carcinoma  2. Consider cardiology referral given systolic murmur and previous Echo in 06/2016 with severe right to left shunt.   Significant Procedures: None   Significant Labs and Imaging:  Recent Labs  Lab 09/13/17 1146 09/14/17 0512 09/15/17 0902  WBC 3.8* 2.6* 4.0  HGB 12.6* 10.7* 11.9*  HCT 36.9* 32.6* 35.2*  PLT 84* 78* 77*   Recent Labs  Lab 09/13/17 1146 09/13/17 1840 09/14/17 0512 09/15/17 0902  NA 138  --  139 137  K 4.1  --  3.7 4.1  CL 110  --  114* 112*  CO2 23  --  22 21*  GLUCOSE 131*  --  74 124*  BUN 9  --  9 9  CREATININE 1.26*  --  1.14 1.20  CALCIUM 8.4*  --  7.9* 8.0*  MG  --  1.9  --   --   PHOS  --  2.2*  --   --   ALKPHOS 216*  --  151* 165*  AST 76*  --  84* 87*  ALT 33  --  35 33  ALBUMIN 2.9*  --  2.4* 2.5*     Results/Tests Pending at Time of Discharge: None   Discharge Medications:  Allergies as of 09/16/2017      Reactions   Shellfish Allergy Hives   Tramadol Itching      Medication List    STOP taking these medications   naproxen sodium 220 MG tablet Commonly known as:  ALEVE   nicotine 21 mg/24hr patch Commonly known as:  NICODERM CQ - dosed in mg/24 hours   pantoprazole 40 MG tablet Commonly known as:  PROTONIX   sucralfate 1 GM/10ML suspension Commonly known as:  CARAFATE   thiamine 100 MG tablet     TAKE these medications   cyclobenzaprine 5 MG tablet Commonly known as:  FLEXERIL Take 1 tablet (5 mg total) 3 (three) times daily as needed by mouth for muscle spasms.   folic acid 1 MG tablet Commonly known as:  FOLVITE Take 1 tablet (1 mg total) by mouth daily.   hydrOXYzine 100 MG capsule Commonly known as:  VISTARIL Take 100 mg 2 (two) times daily by mouth.   ibuprofen 200 MG tablet Commonly known as:   ADVIL,MOTRIN Take 400 mg every 6 (six) hours as needed by mouth.   lactulose 10 GM/15ML solution Commonly known as:  CHRONULAC Take 15 mLs (10 g total) by mouth 3 (three) times daily. What changed:  how much to take   lamoTRIgine 200 MG tablet Commonly known as:  LAMICTAL Take 200 mg daily by mouth.   traZODone 150 MG tablet Commonly known as:  DESYREL Take 150 mg at bedtime by mouth. What changed:  Another medication with the same name was removed. Continue taking this medication, and follow the directions you see here.            Durable Medical Equipment  (From admission, onward)        Start     Ordered   09/16/17 1620  For home  use only DME oxygen  Once    Question Answer Comment  Mode or (Route) Nasal cannula   Liters per Minute 2   Frequency Continuous (stationary and portable oxygen unit needed)   Oxygen delivery system Gas      09/16/17 1619      Discharge Instructions: Please refer to Patient Instructions section of EMR for full details.  Patient was counseled important signs and symptoms that should prompt return to medical care, changes in medications, dietary instructions, activity restrictions, and follow up appointments.   Follow-Up Appointments: [place] Follow up.   Why:  home oxygen arranged Contact information: [place] [id number] [phone]           Elisha Cooksey L. Rosalyn Gess, [place] Medicine Resident PGY-2 09/16/2017 4:57 PM

## 2017-09-16 NOTE — Plan of Care (Signed)
Patient had a restful night, denies pain and is doing well.  P.J. Linus Mako, RN

## 2017-09-16 NOTE — Progress Notes (Signed)
Patrick [name] to be D/C'd Home per MD order.  Discussed with the patient and all questions fully answered.  VSS, Skin clean, dry and intact without evidence of skin break down, no evidence of skin tears noted. IV catheter discontinued intact. Site without signs and symptoms of complications. Dressing and pressure applied.  An After Visit Summary was printed and given to the patient. Patient received prescription.  D/c education completed with patient/family including follow up instructions, medication list, d/c activities limitations if indicated, with other d/c instructions as indicated by MD - patient able to verbalize understanding, all questions fully answered.   Patient instructed to return to ED, call 911, or call MD for any changes in condition.   Patient escorted via [place], and D/C home via private auto.  Holley Raring 09/16/2017 6:17 PM

## 2017-09-16 NOTE — Progress Notes (Signed)
When patient was ambulating heart rate 144 and sat of 86 room air at 1500, then at 1505 heart rate was 74 and sat was 88. Stayed in the 88's for 5 minutes. Then at 1510 sat was 82 and pulse was 110. After those walking sats and him sitting for 3 minutes, his sats went to 93 and heart rate was 68.

## 2017-09-16 NOTE — Progress Notes (Signed)
Family Medicine Teaching Service Daily Progress Note Intern Pager: [phone]  Patient name: Patrick [name] Medical record number: [id number] Date of birth: 1951/04/21 Age: 66 y.o. Gender: male  Primary Care Provider: Dettinger, Fransisca Kaufmann, MD Consultants:[facility]  Code Status:Full  Assessment and Plan: Tulio Facundo Ziglaris a 66 y.o.malepresenting withthoracic back painand RUQ abdominal painwith commitant nausea, vomiting, and fever. PMH is significant for liver cirrhosis, alcoholism, hepatitis C, bipolar disorder, ?COPD,and tobacco use.  Right upper quadrant pain, in setting of liver mass likely HCC: He denies RUQ pain currently, but continues to have tenderness with palpation. Pain is likely secondary to his probable hepatocellular carcinoma on 11/7.  -Zosyn d/c yesterday after 48 hr negative blood cultures -Liver MRI suggestive of hepatocellular carcinoma with portal vein involvement  -AFP elevated 36,749 -> 38,564   -Oncology has been consulted by GI, will see the patient this afternoon -Manage liver cirrhosis as below  Liver cirrhosis: patient with history of alcohol abuse and hepatitis C.CT abdomen confirms liver cirrhosis.He has no notable ascites on exam or additional clinical stigmata of liver dysfunction but portal hypertension on CT.Still with fair synthetic functions.  -Continue lactulose 3 times daily, 2-3 BM/day -Ammonia levelelevated at 72, however previously values ranging from 80-120  History of COPD:No PFT in the chart. Not on any medications at home. No respiratory distress. Lung exam within normal limits. -did have an episode of oxygen desaturation to 80's while working with PT yesterday  -Formal ambulating with pulse oximetry today, assess need for home O2  -Albuterol as needed  Portal vein thrombosis:Noted on CT abdomen11/5 and confirmed with liver doppler today. -Considered chronic per GI given his extensive collateral  flow  Thoracic back pain, improving:Tendernessto right paraspinal musculature around T3-6suggestive for musculoskeletal etiology. States he lifted several cases of water bottles recently.4/10 pain scale today.  -K pad -Continue Flexeril 5mg  TID as needed -Tylenol 650 mg every 8 hours, limit to 2g per 24 hours -D/c Fentanyl per patient request   Normocytic anemia: 12.6 on admission, baseline 11-12.5  -Hgb 11.9 up from 10.7 which was likely dilutional  -No active signs of bleeding -CBC this am   Thrombocytopenia:Stable, platelet78. Baseline 60s-70s. Likely due to underlying liver cirrhosis/splenomegaly -Daily CBC -Avoid anticoagulations  Non-productive cough, resolved:Likely secondary to URI, 2 week history.Flu swab negative. He has no respiratory distresswith no coughing since admission.Lung exam within normal limits. CXR without acute finding.  -Symptomatic therapy with Mucinex  Heart murmur:Noted 2/6systolic murmur throughout.Reports history of murmursince he was a child.Echocardiogram in 06/2016 with severe right to left shunt,otherwise normal. -Consider outpatient cardiology referral for further evaluation  AKI, improving:Baseline appears to be 0.9-1.0.Likely prerenal in setting of dehydration from emesis, received 2.5L fluids in the ED on admission.  -Cr 1.20, 1.26 on admission -GFR >60  -BMP    Bipolar disorder/depression/alcohol induced mood disorder:On Lamictal at home. Reports good compliance with this medication.  -Continue home Lamictal  History of alcohol TKZ:SWFUXN that he is sober for the last 2 months. Now[facility]. -CIWAscore of 0-1thus far, will monitor -CSW consulted, appreciate recommendations  Tobacco ATF:TDDUKGU smoking about 7 cigarettes a day. Declines nicotine patch. -Offered nicotine patch when needed  FEN/GI: -Low-sodium diet -Lactulose as above  Prophylaxis:SCD given  history of thrombocytopenia  Disposition:Pending Oncology recommendations, d/c back to [place]   Subjective:  Patrick [name] is doing well this morning. No acute events overnight. Handling his new diagnosis well, does not wish to speak to any counselors or  chaplain currently. Denies nausea, vomiting, coughing, or change in bowel movements.    Objective: Temp:  [97.9 F (36.6 C)-98.4 F (36.9 C)] 97.9 F (36.6 C) (11/08 0535) Pulse Rate:  [65-69] 69 (11/08 0535) Resp:  [16-18] 18 (11/08 0535) BP: (103-120)/(55-73) 120/73 (11/08 0535) SpO2:  [90 %-98 %] 90 % (11/08 0535) Physical Exam: General:Alert and oriented x4, no acute distress. Cardiovascular:RRR, 2+ systolic murmur noted best heard on left sternal border.No tenderness to anterior chest or lower ribs. Respiratory:CTA bilaterally without wheezing, rales, or rhonchi. Abdomen:Normoactive BS x 4, soft, non-distended abdomen. RUQ tenderness under ribs. No fluid wave. Extremities:Warm, dry. 2+ radial and dorsalis pedis pulses. No LE edema. Tenderness to right paraspinal muscles around T3-6, improved from yesterday. No bruising or deformities noted.    Laboratory: Recent Labs  Lab 09/13/17 1146 09/14/17 0512 09/15/17 0902  WBC 3.8* 2.6* 4.0  HGB 12.6* 10.7* 11.9*  HCT 36.9* 32.6* 35.2*  PLT 84* 78* 77*   Recent Labs  Lab 09/13/17 1146 09/14/17 0512 09/15/17 0902  NA 138 139 137  K 4.1 3.7 4.1  CL 110 114* 112*  CO2 23 22 21*  BUN 9 9 9   CREATININE 1.26* 1.14 1.20  CALCIUM 8.4* 7.9* 8.0*  PROT 6.3* 5.7* 6.1*  BILITOT 1.8* 2.0* 2.0*  ALKPHOS 216* 151* 165*  ALT 33 35 33  AST 76* 84* 87*  GLUCOSE 131* 74 124*     Imaging/Diagnostic Tests: Mr Abdomen W Wo Contrast  Result Date: 09/15/2017 CLINICAL DATA:  Cirrhosis.  Evaluate liver lesion. EXAM: MRI ABDOMEN WITHOUT AND WITH CONTRAST TECHNIQUE: Multiplanar multisequence MR imaging of the abdomen was performed both before and after the administration  of intravenous contrast. CONTRAST:  29mL MULTIHANCE GADOBENATE DIMEGLUMINE 529 MG/ML IV SOLN COMPARISON:  None. FINDINGS: Lower chest: No acute findings. Hepatobiliary: Advanced morphologic features of the liver compatible with cirrhosis. There is a large infiltrative mass involving both the medial segment of left lobe of liver and right lobe of liver. Mass measures approximately 18.3 by 9.8 by 14.3 cm (volume = 1300 cm^3). Evidence of tumor thrombus within the portal vein to the level of the portal venous confluence. Pancreas: No mass, inflammatory changes, or other parenchymal abnormality identified. Spleen:  Splenomegaly.  Spleen has a length of 14.9 cm. Adrenals/Urinary Tract: The adrenal glands are normal. Unremarkable appearance of the kidneys. Stomach/Bowel: Visualized portions within the abdomen are unremarkable. Vascular/Lymphatic: Normal appearance of the abdominal aorta. Large left upper quadrant gastric varices identified. Smaller esophageal varices noted. Re- cannulization and enlargement of the umbilical vein identified. Other: Moderate volume of ascites identified within the upper abdomen. Musculoskeletal: No suspicious bone lesions identified. IMPRESSION: 1. Morphologic features a liver compatible with cirrhosis. 2. Large infiltrative mass involving right lobe of liver and medial segment of left lobe of liver and has an approximate volume of 1300 cc. Findings compatible with hepatocellular carcinoma with portal vein involvement. 3. Stigmata of portal venous hypertension including upper abdominal varices, ascites and splenomegaly. Electronically Signed   By: Kerby Moors M.D.   On: 09/15/2017 09:46   US Liver Doppler  Result Date: 09/14/2017 CLINICAL DATA:  Portal hypertension EXAM: DUPLEX ULTRASOUND OF LIVER TECHNIQUE: Color and duplex Doppler ultrasound was performed to evaluate the hepatic in-flow and out-flow vessels. COMPARISON:  None. FINDINGS: Portal Vein Velocities Thrombosed. Hepatic  Vein Velocities Right:  11.8 cm/sec Middle:  32.0 cm/sec Left:  25.4 cm/sec Hepatic Artery Velocity:  207 cm/sec Splenic Vein Velocity:  13.4 cm/sec Varices: Varices are not  clearly visualized on this study. Varices are present on the accompanying CT from yesterday. Ascites: A small amount of ascites is present about the liver. The portal vein is hypoechoic. Color Doppler imaging confirms thrombosis. Right and left portal vein branches are also thrombosed. Varices noted on the accompanying CT are not clearly visualized on this study. The liver is diffusely heterogeneous compatible with diffuse hepatic parenchymal disease. Hepatic veins are patent with hepato fugal directionality of flow. IMPRESSION: Portal vein thrombosis. The patient has known portal vein thrombosis. Hepatic and splenic veins are patent. The liver is diffusely heterogeneous compatible with cirrhosis. Small amount of ascites. Electronically Signed   By: Marybelle Killings M.D.   On: 09/14/2017 11:04    Nickola Major, Medical Student 09/16/2017, 9:22 AM, [place] Intern pager: [phone], text pages welcome   I examined the patient with the medical student and agree with her assessment and plan and have included my edits as necessary.  Ramsie Ostrander L. Rosalyn Gess, [facility] Resident PGY-2 09/16/2017 2:55 PM

## 2017-09-16 NOTE — Discharge Instructions (Signed)
Patrick [name], you presented with abdominal pain and unfortunately we found a liver mass. You had a discussion with the cancer doctor on the predicted outcomes and treatment modalities available. Please follow up with him outpatient to discuss this further. We wish you the best of luck!

## 2017-09-16 NOTE — Care Management Note (Signed)
Case Management Note  Patient Details  Name: REYNALDO ROSSMAN MRN: [id number] Date of Birth: 09-Apr-1951  Subjective/Objective:                  Admitted with Sepsis, hx of hepatic encephalopathy, portal HTN, COPD, thrombocytopenia, bipolar disorder, ETOH disorder.  PCP:  Vonna Kotyk Dettinger  Action/Plan: Plan is to d/c to home today. Brother to provide transportation to home.  Expected Discharge Date:    09/16/2017           Expected Discharge Plan:  Home/Self Care([facility] (drug and ETOH recovery program))  In-House Referral:     Discharge planning Services  CM Consult  Post Acute Care Choice:    Choice offered to:   pt  DME Arranged:   oxygen DME Agency:    [facility]  HH Arranged:    HH Agency:     Status of Service:  completed  If discussed at [place] of Stay Meetings, dates discussed:    Additional Comments:  Sharin Mons, RN 09/16/2017, 11:52 AM

## 2017-09-16 NOTE — Progress Notes (Signed)
SATURATION QUALIFICATIONS: (This note is used to comply with regulatory documentation for home oxygen)  Patient Saturations on Room Air at Rest = 97%  Patient Saturations on Room Air while Ambulating = 85%  Patient Saturations on 2 Liters of oxygen while Ambulating = 95%  Please briefly explain why patient needs home oxygen:

## 2017-09-16 NOTE — Consult Note (Signed)
New Hematology/Oncology Consult   Referral MD: Dr Sherren Mocha D McDiarmid     Reason for Referral: New diagnosis of hepatocellular carcinoma  HPI:  Patrick [name] is a 66 y.o. male with history of alcohol abuse and hepatitis C infection leading to development of liver cirrhosis, portal vein thrombosis, who was admitted to the hospital on 09/13/17 with progressive pain in the right upper quadrant of the abdomen, lower thoracic back pain, and cough.  On admission, patient was febrile and was treated with antibiotics for suspected sepsis.  Patient was also evaluated for possible spontaneous bacterial peritonitis and was found to be negative.  Additional imaging of the abdomen was obtained demonstrating a 4.1 cm mass in the left hepatic lobe by CT scan.  Liver MRI was obtained on 09/15/17 and showed an 18.3 cm mass with associated tumor thrombus in the portal vein at the level of the portal venous confluence.  The large mass involves both the medial segment of the left lobe as well as the right lobe of the liver.  No significant retroperitoneal lymphadenopathy noted.  Moderate volume ascites identified.  Spleen is enlarged measuring 14.9 cm. CT of the chest obtained on 09/13/17 demonstrated no evidence of pulmonary lesions or mediastinal lymphadenopathy.  The present time, patient denies any fever, chills or night sweats.  No headache.  Denies active shortness of breath or chest pain.  Continues to have some discomfort in the right flank and right upper quadrant, but improved compared to admission.  Patient denies diarrhea or constipation.  No hematochezia or melena at this time.    Past Medical History:  Diagnosis Date  . Anxiety   . Bipolar 1 disorder ([place])   . Cirrhosis ([place])   . COPD (chronic obstructive pulmonary disease) ([place])   . Depression   . Heart murmur   . Hepatitis C   . Oxygen deficiency   . Polysubstance abuse ([place])   . Stroke [facility])   :  Past Surgical History:  Procedure  Laterality Date  . arm surgery    :   Current Facility-Administered Medications:  .  acetaminophen (TYLENOL) tablet 650 mg, 650 mg, Oral, TID PRN, Rogue Bussing, MD .  cyclobenzaprine (FLEXERIL) tablet 5 mg, 5 mg, Oral, TID PRN, Eloise Levels, MD, 5 mg at 09/16/17 0816 .  folic acid (FOLVITE) tablet 1 mg, 1 mg, Oral, Daily, Cyndia Skeeters, Taye T, MD, 1 mg at 09/16/17 0956 .  ipratropium (ATROVENT) nebulizer solution 0.5 mg, 0.5 mg, Nebulization, Q6H PRN, McDiarmid, Blane Ohara, MD .  lactulose (CHRONULAC) 10 GM/15ML solution 10 g, 10 g, Oral, TID, Cyndia Skeeters, Taye T, MD, 10 g at 09/15/17 1651 .  lamoTRIgine (LAMICTAL) tablet 200 mg, 200 mg, Oral, Daily, Cyndia Skeeters, Taye T, MD, 200 mg at 09/16/17 0956 .  multivitamin with minerals tablet 1 tablet, 1 tablet, Oral, Daily, Wendee Beavers T, MD, 1 tablet at 09/16/17 0956 .  thiamine (VITAMIN B-1) tablet 100 mg, 100 mg, Oral, Daily, Cyndia Skeeters, Taye T, MD, 100 mg at 09/16/17 0956 .  traZODone (DESYREL) tablet 100 mg, 100 mg, Oral, QHS, Gonfa, Taye T, MD, 100 mg at 84/16/60 6301:  . folic acid  1 mg Oral Daily  . lactulose  10 g Oral TID  . lamoTRIgine  200 mg Oral Daily  . multivitamin with minerals  1 tablet Oral Daily  . thiamine  100 mg Oral Daily  . traZODone  100 mg Oral QHS  :  Allergies  Allergen Reactions  . Shellfish Allergy  Hives  . Tramadol Itching  :   Family History  Problem Relation Age of Onset  . Arthritis Mother   . Cancer Father        lung  . Diabetes Father   . Mental illness Brother        depression  . Heart disease Paternal Grandmother   . Cancer Paternal Grandmother        skin  . Diabetes Paternal Uncle    Social History   Socioeconomic History  . Marital status: Divorced    Spouse name: Not on file  . Number of children: Not on file  . Years of education: Not on file  . Highest education level: Not on file  Social Needs  . Financial resource strain: Not on file  . Food insecurity - worry: Not on file  . Food  insecurity - inability: Not on file  . Transportation needs - medical: Not on file  . Transportation needs - non-medical: Not on file  Occupational History  . Not on file  Tobacco Use  . Smoking status: Current Every Day Smoker    Packs/day: 0.25    Years: 50.00    Pack years: 12.50    Types: Cigarettes  . Smokeless tobacco: Never Used  Substance and Sexual Activity  . Alcohol use: Yes    Comment: 26 months alcohol free- relapse on 07/10/17  . Drug use: No  . Sexual activity: No    Birth control/protection: Condom  Other Topics Concern  . Not on file  Social History Narrative  . Not on file    Review of Systems: Per HPI.  All other systems are negative.  Physical Exam:  Blood pressure (!) 148/71, pulse 68, temperature 98.1 F (36.7 C), temperature source Oral, resp. rate 18, height 5\' 8"  (1.727 m), weight 200 lb 13.4 oz (91.1 kg), SpO2 93 %.  Patient appears alert, awake, oriented x3. HEENT: Anicteric sclerae, moist mucous membranes Lungs: Clear to auscultation anteriorly Cardiac: Regular rate and rhythm, no murmurs. Abdomen: Mildly to moderately distended.  Dullness to percussion throughout the abdomen consistent with ascites.  Cannot appreciate liver by palpation at this time. Lymph nodes: No palpable lymphadenopathy. Neurologic: No focal neurological deficits Skin: Unremarkable Musculoskeletal: Unremarkable  LABS:  Recent Labs    09/14/17 0512 09/15/17 0902  WBC 2.6* 4.0  HGB 10.7* 11.9*  HCT 32.6* 35.2*  PLT 78* 77*    Recent Labs    09/14/17 0512 09/15/17 0902  NA 139 137  K 3.7 4.1  CL 114* 112*  CO2 22 21*  GLUCOSE 74 124*  BUN 9 9  CREATININE 1.14 1.20  CALCIUM 7.9* 8.0*      RADIOLOGY:  Dg Chest 2 View  Result Date: 09/13/2017 CLINICAL DATA:  Fever and cough.  Right rib pain. EXAM: CHEST  2 VIEW COMPARISON:  07/14/2017.  03/22/2017. FINDINGS: Lungs are hyperexpanded. Interstitial markings are diffusely coarsened with chronic features.  The lungs are clear without focal pneumonia, edema, pneumothorax or pleural effusion. Right hilar fullness is similar to prior studies and likely represents vascular anatomy. The visualized bony structures of the thorax are intact. IMPRESSION: Stable.  No acute findings. Electronically Signed   By: Misty Stanley M.D.   On: 09/13/2017 12:55   Ct Chest W Contrast  Result Date: 09/13/2017 CLINICAL DATA:  Cough, back pain. EXAM: CT CHEST, ABDOMEN, AND PELVIS WITH CONTRAST TECHNIQUE: Multidetector CT imaging of the chest, abdomen and pelvis was performed following the standard protocol during  bolus administration of intravenous contrast. CONTRAST:  157mL ISOVUE-300 IOPAMIDOL (ISOVUE-300) INJECTION 61% COMPARISON:  CT scan of February 16, 2006. FINDINGS: CT CHEST FINDINGS Cardiovascular: Atherosclerosis of thoracic aorta is noted without aneurysm or dissection. Coronary artery calcifications are noted. No pericardial effusion is noted. Normal cardiac size. Mediastinum/Nodes: No enlarged mediastinal, hilar, or axillary lymph nodes. Thyroid gland, trachea, and esophagus demonstrate no significant findings. Lungs/Pleura: Lungs are clear. No pleural effusion or pneumothorax. Musculoskeletal: No chest wall mass or suspicious bone lesions identified. CT ABDOMEN PELVIS FINDINGS Hepatobiliary: Heterogeneous appearance and nodular hepatic contours are noted consistent with hepatic cirrhosis. 4.1 x 3.8 cm low density is noted in left hepatic lobe concerning for possible neoplasm. Status post cholecystectomy. Pancreas: Unremarkable. No pancreatic ductal dilatation or surrounding inflammatory changes. Spleen: Mild splenomegaly is noted. Adrenals/Urinary Tract: Adrenal glands are unremarkable. Kidneys are normal, without renal calculi, focal lesion, or hydronephrosis. Bladder is unremarkable. Stomach/Bowel: Stomach is within normal limits. Appendix appears normal. No evidence of bowel wall thickening, distention, or inflammatory  changes. Vascular/Lymphatic: Aortic atherosclerosis. No enlarged abdominal or pelvic lymph nodes. Reproductive: Prostate is unremarkable. Other: There appears to be portal venous thrombosis most likely due to hepatic cirrhosis. Collateral veins are seen around the spleen and pancreas as well as patent umbilical vein along the anterior abdominal wall. Musculoskeletal: No acute or significant osseous findings. IMPRESSION: Hepatic cirrhosis is noted with mild splenomegaly and enlarged collateral veins consistent with portal hypertension. No contrast is seen in the main portal vein concerning for portal venous thrombosis. Doppler ultrasound may be performed for further evaluation. Aortic atherosclerosis. 4.1 x 3.8 cm low density is noted in left hepatic lobe which may represent hepatic cirrhosis, but hepatocellular carcinoma cannot be excluded. Further evaluation with MRI with and without gadolinium is recommended on nonemergent basis when patient can hold still and follow-up breathing instructions for the MRI. Electronically Signed   By: Marijo Conception, M.D.   On: 09/13/2017 16:09   Mr Abdomen W Wo Contrast  Result Date: 09/15/2017 CLINICAL DATA:  Cirrhosis.  Evaluate liver lesion. EXAM: MRI ABDOMEN WITHOUT AND WITH CONTRAST TECHNIQUE: Multiplanar multisequence MR imaging of the abdomen was performed both before and after the administration of intravenous contrast. CONTRAST:  69mL MULTIHANCE GADOBENATE DIMEGLUMINE 529 MG/ML IV SOLN COMPARISON:  None. FINDINGS: Lower chest: No acute findings. Hepatobiliary: Advanced morphologic features of the liver compatible with cirrhosis. There is a large infiltrative mass involving both the medial segment of left lobe of liver and right lobe of liver. Mass measures approximately 18.3 by 9.8 by 14.3 cm (volume = 1300 cm^3). Evidence of tumor thrombus within the portal vein to the level of the portal venous confluence. Pancreas: No mass, inflammatory changes, or other  parenchymal abnormality identified. Spleen:  Splenomegaly.  Spleen has a length of 14.9 cm. Adrenals/Urinary Tract: The adrenal glands are normal. Unremarkable appearance of the kidneys. Stomach/Bowel: Visualized portions within the abdomen are unremarkable. Vascular/Lymphatic: Normal appearance of the abdominal aorta. Large left upper quadrant gastric varices identified. Smaller esophageal varices noted. Re- cannulization and enlargement of the umbilical vein identified. Other: Moderate volume of ascites identified within the upper abdomen. Musculoskeletal: No suspicious bone lesions identified. IMPRESSION: 1. Morphologic features a liver compatible with cirrhosis. 2. Large infiltrative mass involving right lobe of liver and medial segment of left lobe of liver and has an approximate volume of 1300 cc. Findings compatible with hepatocellular carcinoma with portal vein involvement. 3. Stigmata of portal venous hypertension including upper abdominal varices, ascites and splenomegaly. Electronically  Signed   By: Kerby Moors M.D.   On: 09/15/2017 09:46   Ct Abdomen Pelvis W Contrast  Result Date: 09/13/2017 CLINICAL DATA:  Cough, back pain. EXAM: CT CHEST, ABDOMEN, AND PELVIS WITH CONTRAST TECHNIQUE: Multidetector CT imaging of the chest, abdomen and pelvis was performed following the standard protocol during bolus administration of intravenous contrast. CONTRAST:  151mL ISOVUE-300 IOPAMIDOL (ISOVUE-300) INJECTION 61% COMPARISON:  CT scan of February 16, 2006. FINDINGS: CT CHEST FINDINGS Cardiovascular: Atherosclerosis of thoracic aorta is noted without aneurysm or dissection. Coronary artery calcifications are noted. No pericardial effusion is noted. Normal cardiac size. Mediastinum/Nodes: No enlarged mediastinal, hilar, or axillary lymph nodes. Thyroid gland, trachea, and esophagus demonstrate no significant findings. Lungs/Pleura: Lungs are clear. No pleural effusion or pneumothorax. Musculoskeletal: No chest  wall mass or suspicious bone lesions identified. CT ABDOMEN PELVIS FINDINGS Hepatobiliary: Heterogeneous appearance and nodular hepatic contours are noted consistent with hepatic cirrhosis. 4.1 x 3.8 cm low density is noted in left hepatic lobe concerning for possible neoplasm. Status post cholecystectomy. Pancreas: Unremarkable. No pancreatic ductal dilatation or surrounding inflammatory changes. Spleen: Mild splenomegaly is noted. Adrenals/Urinary Tract: Adrenal glands are unremarkable. Kidneys are normal, without renal calculi, focal lesion, or hydronephrosis. Bladder is unremarkable. Stomach/Bowel: Stomach is within normal limits. Appendix appears normal. No evidence of bowel wall thickening, distention, or inflammatory changes. Vascular/Lymphatic: Aortic atherosclerosis. No enlarged abdominal or pelvic lymph nodes. Reproductive: Prostate is unremarkable. Other: There appears to be portal venous thrombosis most likely due to hepatic cirrhosis. Collateral veins are seen around the spleen and pancreas as well as patent umbilical vein along the anterior abdominal wall. Musculoskeletal: No acute or significant osseous findings. IMPRESSION: Hepatic cirrhosis is noted with mild splenomegaly and enlarged collateral veins consistent with portal hypertension. No contrast is seen in the main portal vein concerning for portal venous thrombosis. Doppler ultrasound may be performed for further evaluation. Aortic atherosclerosis. 4.1 x 3.8 cm low density is noted in left hepatic lobe which may represent hepatic cirrhosis, but hepatocellular carcinoma cannot be excluded. Further evaluation with MRI with and without gadolinium is recommended on nonemergent basis when patient can hold still and follow-up breathing instructions for the MRI. Electronically Signed   By: Marijo Conception, M.D.   On: 09/13/2017 16:09   US Liver Doppler  Result Date: 09/14/2017 CLINICAL DATA:  Portal hypertension EXAM: DUPLEX ULTRASOUND OF LIVER  TECHNIQUE: Color and duplex Doppler ultrasound was performed to evaluate the hepatic in-flow and out-flow vessels. COMPARISON:  None. FINDINGS: Portal Vein Velocities Thrombosed. Hepatic Vein Velocities Right:  11.8 cm/sec Middle:  32.0 cm/sec Left:  25.4 cm/sec Hepatic Artery Velocity:  207 cm/sec Splenic Vein Velocity:  13.4 cm/sec Varices: Varices are not clearly visualized on this study. Varices are present on the accompanying CT from yesterday. Ascites: A small amount of ascites is present about the liver. The portal vein is hypoechoic. Color Doppler imaging confirms thrombosis. Right and left portal vein branches are also thrombosed. Varices noted on the accompanying CT are not clearly visualized on this study. The liver is diffusely heterogeneous compatible with diffuse hepatic parenchymal disease. Hepatic veins are patent with hepato fugal directionality of flow. IMPRESSION: Portal vein thrombosis. The patient has known portal vein thrombosis. Hepatic and splenic veins are patent. The liver is diffusely heterogeneous compatible with cirrhosis. Small amount of ascites. Electronically Signed   By: Marybelle Killings M.D.   On: 09/14/2017 11:04    Assessment and Plan:  66 y.o. male with right upper quadrant abdominal  pain and a large mass in the right lobe of the liver with findings consistent with hepatocellular carcinoma based on radiographic appearance on MRI coupled with AFP value of over 36,000.  Imaging is negative for evidence of metastatic disease and the retroperitoneal lymph nodes or chest cavity.  Based on the size of the mass, patient is not a candidate for liver transplantation.  Due to presence of involvement of the portal veins with tumor thrombus, patient is unlikely to be the candidate for embolization with TACE or radioactive beads.  On the options of therapy at this time are palliative systemic treatments such as sorafenib, lenvatinib, or cytotoxic chemotherapy.  At this time, I do recommend  the patient to strongly consider his options which include the previously mentioned therapies or best supportive care in the context of hospice.  Plan: --RTC with me in my [facility] next week to review the findings and therapeutic options with the patient and his family    Ardath Sax, MD 09/16/2017, 6:17 PM

## 2017-09-16 NOTE — Progress Notes (Signed)
Patient was wanting to know the phone number and name of his oncologist. MD notified and unsure of the information. Patient was given the number of the Cancer center at [facility] to call there tomorrow. Patient was educated.

## 2017-09-16 NOTE — Progress Notes (Signed)
PCP to ensure outpatient oncology follow-up for hepatocellular cancer. I will forward note to his PCP

## 2017-09-17 NOTE — Progress Notes (Signed)
Can we make sure that he has a referral in place for oncology, diagnosis new hepatocellular cancer

## 2017-09-17 NOTE — Addendum Note (Signed)
Addended by: Zannie Cove on: 09/17/2017 08:11 AM   Modules accepted: Orders

## 2017-09-18 LAB — CULTURE, BLOOD (ROUTINE X 2)
CULTURE: NO GROWTH
CULTURE: NO GROWTH
Special Requests: ADEQUATE

## 2017-09-18 NOTE — ED Provider Notes (Signed)
[facility] Provider Note   CSN: [id number] Arrival date & time: 09/13/17  1059     History   Chief Complaint Chief Complaint  Patient presents with  . Cough  . Back Pain  . Dizziness  . Fever    HPI Patrick [name] is a 66 y.o. male who presents to the ED with cc of fever and abdominal pain. He has a pmh of ETOH abuse (sober for 67 days), Cirrhosis, COPD, Hep C. The patient states that he has been feeling very poorly, he has had a cough at home along with vomiting nonbloody nonbilious vomitus for the past week.  As he arrived today he was febrile, leukopenic, transaminitis.  Patient states that he has had pain in his right lower lung and abdomen.  He feels like it is secondary to his "cold."  He denies having a flu vaccination this year.  He denies constipation, diarrhea, urinary symptoms or flank pain.  Patient's fever resolved with p.o. Tylenol. In the waiting room the patient had a presyncopal event while going to the bathroom.  HPI  Past Medical History:  Diagnosis Date  . Anxiety   . Bipolar 1 disorder ([place])   . Cirrhosis ([place])   . COPD (chronic obstructive pulmonary disease) ([place])   . Depression   . Heart murmur   . Hepatitis C   . Oxygen deficiency   . Polysubstance abuse ([place])   . Stroke [facility])     Patient Active Problem List   Diagnosis Date Noted  . Hepatocellular carcinoma ([place])   . Aortic atherosclerosis ([place]) 09/14/2017  . Alcohol use disorder, moderate, dependence ([place]) 09/14/2017  . Compensated HCV cirrhosis and alcohol-related ([place]) 09/14/2017  . Syncope and collapse 09/14/2017  . Portal hypertension ([place]) 09/14/2017  . Back pain 09/14/2017  . Abdominal pain 09/13/2017  . Sepsis ([place]) 09/13/2017  . Cough   . Fever   . Liver mass   . Portal vein thrombosis   . Bipolar affective disorder, depressed, mild ([place]) 08/03/2017  . Evaluation by medical service required   . Suicidal ideations 07/15/2017  . Alcohol-induced mood  disorder ([place]) 07/14/2017  . Occult blood positive stool 07/14/2017  . ETOH abuse 07/14/2017  . Thrombocytopenia ([place]) 07/14/2017  . Atypical chest pain 07/14/2017  . COPD (chronic obstructive pulmonary disease) ([place]) 07/08/2017  . Hepatic encephalopathy ([place]) 01/05/2015  . Bipolar 1 disorder ([place]) 01/05/2015  . Insomnia 01/05/2015  . Anemia, chronic disease 01/05/2015  . Neuropathy 01/05/2015  . Cirrhosis ([place]) 01/05/2015  . History of stroke 01/05/2015  . Altered mental status     Past Surgical History:  Procedure Laterality Date  . arm surgery         Home Medications    Prior to Admission medications   Medication Sig Start Date End Date Taking? Authorizing Provider  folic acid (FOLVITE) 1 MG tablet Take 1 tablet (1 mg total) by mouth daily. 08/04/17  Yes Nita Sells, MD  hydrOXYzine (VISTARIL) 100 MG capsule Take 100 mg 2 (two) times daily by mouth.   Yes [provider]  ibuprofen (ADVIL,MOTRIN) 200 MG tablet Take 400 mg every 6 (six) hours as needed by mouth.   Yes [provider]  lactulose (CHRONULAC) 10 GM/15ML solution Take 15 mLs (10 g total) by mouth 3 (three) times daily. Patient taking differently: Take 20 g 3 (three) times daily by mouth.  08/03/17  Yes Nita Sells, MD  lamoTRIgine (LAMICTAL) 200 MG tablet Take 200  mg daily by mouth.   Yes [provider]  traZODone (DESYREL) 150 MG tablet Take 150 mg at bedtime by mouth.   Yes [provider]  cyclobenzaprine (FLEXERIL) 5 MG tablet Take 1 tablet (5 mg total) 3 (three) times daily as needed by mouth for muscle spasms. 09/16/17   Eloise Levels, MD    Family History Family History  Problem Relation Age of Onset  . Arthritis Mother   . Cancer Father        lung  . Diabetes Father   . Mental illness Brother        depression  . Heart disease Paternal Grandmother   . Cancer Paternal Grandmother        skin  . Diabetes Paternal Uncle     Social  History Social History   Tobacco Use  . Smoking status: Current Every Day Smoker    Packs/day: 0.25    Years: 50.00    Pack years: 12.50    Types: Cigarettes  . Smokeless tobacco: Never Used  Substance Use Topics  . Alcohol use: Yes    Comment: 26 months alcohol free- relapse on 07/10/17  . Drug use: No     Allergies   Shellfish allergy and Tramadol   Review of Systems Review of Systems  Ten systems reviewed and are negative for acute change, except as noted in the HPI.   Physical Exam Updated Vital Signs BP (!) 148/71 (BP Location: Left Arm)   Pulse 68   Temp 98.1 F (36.7 C) (Oral)   Resp 18   Ht 5\' 8"  (1.727 m)   Wt 91.1 kg (200 lb 13.4 oz)   SpO2 93%   BMI 30.54 kg/m   Physical Exam  Constitutional: He appears well-developed and well-nourished. No distress.  Appears chronically ill  HENT:  Head: Normocephalic and atraumatic.  Eyes: Conjunctivae and EOM are normal. Pupils are equal, round, and reactive to light. No scleral icterus.  Neck: Normal range of motion. Neck supple.  Cardiovascular: Normal rate, regular rhythm and normal heart sounds.  Pulmonary/Chest: Effort normal and breath sounds normal. No respiratory distress.  Abdominal: Soft. Bowel sounds are normal. He exhibits distension. He exhibits no ascites. There is hepatomegaly. There is tenderness in the right upper quadrant and right lower quadrant. There is guarding. There is no rigidity and no rebound.  Musculoskeletal: He exhibits no edema.  Neurological: He is alert.  Skin: Skin is warm and dry. He is not diaphoretic.  Psychiatric: His behavior is normal.  Nursing note and vitals reviewed.    ED Treatments / Results  Labs (all labs ordered are listed, but only abnormal results are displayed) Labs Reviewed  COMPREHENSIVE METABOLIC PANEL - Abnormal; Notable for the following components:      Result Value   Glucose, Bld 131 (*)    Creatinine, Ser 1.26 (*)    Calcium 8.4 (*)    Total  Protein 6.3 (*)    Albumin 2.9 (*)    AST 76 (*)    Alkaline Phosphatase 216 (*)    Total Bilirubin 1.8 (*)    GFR calc non Af Amer 59 (*)    All other components within normal limits  CBC WITH DIFFERENTIAL/PLATELET - Abnormal; Notable for the following components:   WBC 3.8 (*)    Hemoglobin 12.6 (*)    HCT 36.9 (*)    RDW 19.4 (*)    Platelets 84 (*)    All other components within normal limits  PROTIME-INR - Abnormal; Notable for the following components:   Prothrombin Time 15.6 (*)    All other components within normal limits  PHOSPHORUS - Abnormal; Notable for the following components:   Phosphorus 2.2 (*)    All other components within normal limits  COMPREHENSIVE METABOLIC PANEL - Abnormal; Notable for the following components:   Chloride 114 (*)    Calcium 7.9 (*)    Total Protein 5.7 (*)    Albumin 2.4 (*)    AST 84 (*)    Alkaline Phosphatase 151 (*)    Total Bilirubin 2.0 (*)    Anion gap 3 (*)    All other components within normal limits  CBC - Abnormal; Notable for the following components:   WBC 2.6 (*)    RBC 3.90 (*)    Hemoglobin 10.7 (*)    HCT 32.6 (*)    RDW 19.3 (*)    Platelets 78 (*)    All other components within normal limits  PROTIME-INR - Abnormal; Notable for the following components:   Prothrombin Time 16.9 (*)    All other components within normal limits  AMMONIA - Abnormal; Notable for the following components:   Ammonia 72 (*)    All other components within normal limits  GAMMA GT - Abnormal; Notable for the following components:   GGT 158 (*)    All other components within normal limits  AFP TUMOR MARKER - Abnormal; Notable for the following components:   AFP, Serum, Tumor Marker 36,749.0 (*)    All other components within normal limits  AFP TUMOR MARKER - Abnormal; Notable for the following components:   AFP, Serum, Tumor Marker 38,564.0 (*)    All other components within normal limits  CBC - Abnormal; Notable for the following  components:   Hemoglobin 11.9 (*)    HCT 35.2 (*)    RDW 19.1 (*)    Platelets 77 (*)    All other components within normal limits  COMPREHENSIVE METABOLIC PANEL - Abnormal; Notable for the following components:   Chloride 112 (*)    CO2 21 (*)    Glucose, Bld 124 (*)    Calcium 8.0 (*)    Total Protein 6.1 (*)    Albumin 2.5 (*)    AST 87 (*)    Alkaline Phosphatase 165 (*)    Total Bilirubin 2.0 (*)    Anion gap 4 (*)    All other components within normal limits  I-STAT CG4 LACTIC ACID, ED - Abnormal; Notable for the following components:   Lactic Acid, Venous 2.41 (*)    All other components within normal limits  CULTURE, BLOOD (ROUTINE X 2)  CULTURE, BLOOD (ROUTINE X 2)  URINE CULTURE  MRSA PCR SCREENING  CULTURE, EXPECTORATED SPUTUM-ASSESSMENT  URINALYSIS, ROUTINE W REFLEX MICROSCOPIC  INFLUENZA PANEL BY PCR (TYPE A & B)  MAGNESIUM  LIPASE, BLOOD  I-STAT CG4 LACTIC ACID, ED  I-STAT CG4 LACTIC ACID, ED  I-STAT CG4 LACTIC ACID, ED  I-STAT CG4 LACTIC ACID, ED    EKG  EKG Interpretation  Date/Time:  Monday September 13 2017 11:31:03 EST Ventricular Rate:  72 PR Interval:  188 QRS Duration: 100 QT Interval:  416 QTC Calculation: 455 R Axis:   41 Text Interpretation:  Normal sinus rhythm Nonspecific T wave abnormality Abnormal ECG No significant change since last tracing Confirmed by Gareth Morgan [phone]) on 09/13/2017 1:54:37 PM       Radiology No results found.  Procedures Procedures (including critical  care time)  Medications Ordered in ED Medications  acetaminophen (TYLENOL) 325 MG tablet (not administered)  sodium chloride 0.9 % bolus 1,000 mL (0 mLs Intravenous Stopped 09/13/17 1428)    And  sodium chloride 0.9 % bolus 1,000 mL (0 mLs Intravenous Stopped 09/13/17 1428)    And  sodium chloride 0.9 % bolus 500 mL (0 mLs Intravenous Stopped 09/13/17 1630)  piperacillin-tazobactam (ZOSYN) IVPB 3.375 g (0 g Intravenous Stopped 09/13/17 1408)  vancomycin  (VANCOCIN) 1,750 mg in sodium chloride 0.9 % 500 mL IVPB (0 mg Intravenous Stopped 09/13/17 1630)  iopamidol (ISOVUE-300) 61 % injection (100 mLs  Contrast Given 09/13/17 1500)  fentaNYL (SUBLIMAZE) injection 100 mcg (100 mcg Intravenous Given 09/13/17 1721)  pneumococcal 23 valent vaccine (PNU-IMMUNE) injection 0.5 mL (0.5 mLs Intramuscular Given 09/14/17 1021)  gadobenate dimeglumine (MULTIHANCE) injection 19 mL (19 mLs Intravenous Contrast Given 09/15/17 0128)     Initial Impression / Assessment and Plan / ED Course  I have reviewed the triage vital signs and the nursing notes.  Pertinent labs & imaging results that were available during my care of the patient were reviewed by me and considered in my medical decision making (see chart for details).  Clinical Course as of Sep 18 704  Mon Sep 13, 2017  1645 Temp: (!) 101.6 F (38.7 C) [AH]  1734 I spole with Dr. Senaida Ores of GI who's group will consult on the patient tomorrow. He recommends an alpha fetoprotein level which may be a good marker of HCC. He does not recommend anticoagulation at this time.   [AH]    Clinical Course User Index [AH] Margarita Mail, PA-C    Patient with liver mass on CT. This [facility] and I have copied and pasted outside note below is in [facility] from 09/24/2016. Because he has a hx of esophageal varices I will hold anticoagulation and consult with our GI on call today. The patient still has fever, flank and abdominal pain. The patient's urine is still pending.  Imaging:  06/30/2016 CT AP w/ Contrast 1. Two arterially enhancing hepatic lesions in segment 5/8 and 7/8 which are suspicious for hepatocellular carcinoma. Recommend liver protocol MRI for further evaluation. 2. Cirrhotic liver morphology with sequela of portal venous hypertension, including splenomegaly, numerous portosystemic collateral vessels, and trace perihepatic ascites.  3. Status post cholecystectomy. Small  locules of gas in the intrahepatic biliary ducts are of uncertain etiology and clinical significance. Consider correlation with recent history of ERCP or sphincterotomy. 4. Additional ancillary findings as detailed above.  07/01/2016 MRI Liver w, w/o Contrast 1. Previously-described lesion in segment IVb is indeterminate on MRI and demonstrates mild T2 hyperintensity with subtle enhancement but no definite washout or restricted diffusion. However, CT appearances remain concerning for hepatocellular carcinoma; recommend follow-up MRI in 3 months. 2. The lesion described in segment 7/8 on the prior CT is not definitively seen on this examination. 3. Trace perihepatic ascites with cirrhotic liver morphology and sequelae of portal hypertension, with splenomegaly and numerous portosystemic collateral vessels.  Procedures:  Endoscopy:  02/2015- small esophageal varices and portal hypertensive gastropathy as well as erosive gastropathy. 06/2016 EGD at [facility] - records not uploaded in our system 06/2016 Colonoscopy at [facility] - records not uploaded in our system  ASSESSMENT AND PLAN: Mr. Zeiss is a 66 yo M with PMH of EtOH/HCV (s/p treatment w/ Harvoni 2016) cirrhosis (c/b HE, ascites, EV), bipolar disorder, PFO, COPD (currently on 3 L O2) who presents  to establish care for his cirrhosis and follow-up of recent liver lesions seen on imaging, concerning for [facility]. He is immune to HAV, not HBV.  MELD-Na: 8 CP class: B7  Plan:  - Although etiology of cirrhosis thought 2/2 to EtOH, will obtain Fe profile, ANA to r/o potential other etiologies - Patient with history of HCV s/p treatment with Harvoni in 2016. Will check HCV RNA Quant -Vaccination for HBV through PCP  Varices:  - 02/2015 OSH EGD w/ small esophageal varices and portal hypertensive gastropathy as well as erosive gastropathy. Patient reportedly had repeat EGD in 06/2016 but we do not have records in our system.Pending findings of this EGD, we will  then determine if patient requires prophylaxis w/ BB and when patient will need f/u EGD.  Plan:  -EGD report requested  Volume overload: Trace ascites seen on recent imaging. Patient w/o acute issues w/ abdominal distention or LE swelling. No indication for diuretic therapy.  -Advised low Na 2 gm daily diet  Hepatic encephalopathy:No acute issues today in clinic. Patient does report syncopal episodes in the past but unsure if this is truly related to HE.  -Continue lactulose TID w/ goal of 3-4 BMs daily  Concern for HCC: Last imaging with CT 06/2016 w/ two arterially enhancing hepatic lesions in segment 5/8 and 7/8 which are suspicious for hepatocellular carcinoma. F/u MRI liver protocol w/ one lesion w/ subtle enhancement but no definite washout or restricted diffusion.  Plan: - Repeat MRI liver protocol to re-evaluate liver lesion (assess growth and LI-RADs score).  - Last AFP: 2.79 06/2016. Will order repeat.  Liver transplantation candidacy: He is currently well-compensated with a low MELD-Na. Should the need for LT arise, his COPD with O2 requirement is a barrier.   Preventive (to be performed at the discretion of PCP): Patients with cirrhosis have an increased risk of osteopenia and osteoporosis, and warrant screening (as outlined below). These patients should also undergo age-appropriate healthcare maintenance.  Plan:  -Vitamin A and D levels with appropriate supplementation through PCP -Obtain baseline DEXA scan  -Should get Flu Shot Annually  Follow-up: 3 months   Electronically signed by: Dorthea Cove, MD 09/24/2016 2:16 PM  I saw and evaluated the patient and reviewed the resident's note. I agree with the resident's findings and plan. I have updated and edited the note where appropriate.   Electronically Signed by: Maryland Pink, MD  Attending Physician Assistant Professor [facility] Hepatology    09/13/2017 4:56 PM Patient with portal vein thrombosis, known  esophageal varices, he will be admitted by the family medicine service, awaiting consult with GI specialist at this point.    Final Clinical Impressions(s) / ED Diagnoses   Final diagnoses:  Portal vein thrombosis  Liver mass  Fever, unspecified fever cause  Cough    ED Discharge Orders        Ordered    cyclobenzaprine (FLEXERIL) 5 MG tablet  3 times daily PRN     09/16/17 1623    Increase activity slowly     09/16/17 1623    Diet - low sodium heart healthy     09/16/17 1623       Margarita Mail, PA-C 09/18/17 6546    Gareth Morgan, MD 09/18/17 [phone]

## 2017-09-21 ENCOUNTER — Telehealth: Payer: Self-pay | Admitting: Hematology and Oncology

## 2017-09-21 ENCOUNTER — Encounter: Payer: Self-pay | Admitting: Hematology and Oncology

## 2017-09-21 NOTE — Telephone Encounter (Signed)
A hospital follow up appt has been scheduled for the pt to see Dr. Lebron Conners on 11/23 at 10am. Pt aware to arrive 30 minutes early. Letter mailed.

## 2017-09-22 ENCOUNTER — Telehealth: Payer: Self-pay | Admitting: Hematology and Oncology

## 2017-09-22 NOTE — Telephone Encounter (Signed)
Patients brother called in to confirm appointment time

## 2017-09-27 ENCOUNTER — Telehealth: Payer: Self-pay

## 2017-09-27 ENCOUNTER — Ambulatory Visit (HOSPITAL_BASED_OUTPATIENT_CLINIC_OR_DEPARTMENT_OTHER): Payer: Medicare Other | Admitting: Hematology and Oncology

## 2017-09-27 ENCOUNTER — Encounter: Payer: Self-pay | Admitting: Hematology and Oncology

## 2017-09-27 VITALS — BP 138/59 | HR 83 | Temp 98.5°F | Resp 18 | Ht 68.0 in | Wt 184.1 lb

## 2017-09-27 DIAGNOSIS — C220 Liver cell carcinoma: Secondary | ICD-10-CM | POA: Diagnosis present

## 2017-09-27 DIAGNOSIS — R109 Unspecified abdominal pain: Secondary | ICD-10-CM | POA: Diagnosis not present

## 2017-09-27 DIAGNOSIS — Z720 Tobacco use: Secondary | ICD-10-CM

## 2017-09-27 MED ORDER — MORPHINE SULFATE 15 MG PO TABS: 15.0000 mg | ORAL_TABLET | Freq: Four times a day (QID) | ORAL | 0 refills | Status: AC | PRN

## 2017-09-27 NOTE — Telephone Encounter (Signed)
Called Patrick [name] regarding pt increasing pain level and need to see provider.  Dr. Lebron Conners stated he could fit pt in at 11:40.  Mr. Zelphia Cairo said he would get pt here as soon as he could.

## 2017-09-27 NOTE — Telephone Encounter (Signed)
Brother Marya Amsler called that pt is in a lot of pain. It is under his rib cage and on his side. Tylenol and motrin are not helping. They have appt on Friday at 10 am. He had to cut a trip to [place] short on Saturday, he had to cut church short on Sunday. It is affecting his appetite. Brother unaware of BM pattern or if abd is bloated. Unable to contact pt at present.  Pt has a caregiver Toy Baker [phone] who is not at house currently.   S/w Dr Lebron Conners and his nurse will arrange for pt to be seen today.

## 2017-09-29 ENCOUNTER — Encounter: Payer: Self-pay | Admitting: Radiation Oncology

## 2017-10-01 ENCOUNTER — Telehealth: Payer: Self-pay

## 2017-10-01 ENCOUNTER — Encounter: Payer: Self-pay | Admitting: Hematology and Oncology

## 2017-10-01 ENCOUNTER — Ambulatory Visit (HOSPITAL_BASED_OUTPATIENT_CLINIC_OR_DEPARTMENT_OTHER): Payer: Medicare Other | Admitting: Hematology and Oncology

## 2017-10-01 DIAGNOSIS — C220 Liver cell carcinoma: Secondary | ICD-10-CM

## 2017-10-01 DIAGNOSIS — R109 Unspecified abdominal pain: Secondary | ICD-10-CM | POA: Diagnosis not present

## 2017-10-01 DIAGNOSIS — Z720 Tobacco use: Secondary | ICD-10-CM

## 2017-10-01 NOTE — Telephone Encounter (Addendum)
Spoke with Myra, Network engineer, at [facility]. Was transferred to the referral department to determine processing of Hospice Referral. Patient information shared and process started to visit with patient at home to determine needs.

## 2017-10-01 NOTE — Telephone Encounter (Deleted)
Spoke with Myra, Network engineer, at [facility]. Was transferred to the referral department to determine processing of Hospice Referral. Patient information shared and process started to visit with patient at home to determine needs.

## 2017-10-04 ENCOUNTER — Encounter: Payer: Self-pay | Admitting: Radiation Oncology

## 2017-10-04 NOTE — Progress Notes (Signed)
GI Location of Tumor / Histology: Liver 18cm mass(right lobe)  Patrick [name] presented  months ago with symptoms of: severe fever and abdominal pain,upper quadrant pain,thoracic back pain,sob  Biopsies of (if applicable) revealed: NONE  Past/Anticipated interventions by surgeon, if any: EGD 1 year ago [facility]   Past/Anticipated interventions by medical oncology, if any: Dr. San Morelle, rteferral possible palliative radiation,  Weight changes, if any:  loss 4-5  lbs  Bowel/Bladder complaints, if VKP:QAESL lactulose daily constipation  Nausea / Vomiting, if any: last 3-4 days nauseated none at present  Pain issues, if any:  8/10 scale right lower abdomen side Any blood per rectum:   NO SAFETY ISSUES: yes, weak, last fall 3 months ago  Prior radiation? NO  Pacemaker/ICD? NO  Is the patient on methotrexate?NO  Current Complaints/Details:  Divorced,Bipolar , Stroke, depression, Anxiety;,ETOH abuse, relapsed 07/10/17,  Hepatitis C,Jepatic encephalopathy, Cirrhosis 1/2 ppd cigarettes x 50 years ,portal vein thrombosis Father  Lung,Cancer, DM;  Mother,HTN,Paternal grandfather skin cancer,hearty disease,  BP (!) 118/51   Pulse 81   Temp 97.8 F (36.6 C) (Oral)   Resp 20   Ht 5\' 8"  (1.727 m)   Wt 183 lb 3.2 oz (83.1 kg)   SpO2 (!) 87% Comment: roomair wears o2 at home , db came up to 90  BMI 27.86 kg/m   Wt Readings from Last 3 Encounters:  10/06/17 183 lb 3.2 oz (83.1 kg)  10/01/17 187 lb 11.2 oz (85.1 kg)  09/27/17 184 lb 1.6 oz (83.5 kg)

## 2017-10-06 ENCOUNTER — Encounter: Payer: Self-pay | Admitting: General Practice

## 2017-10-06 ENCOUNTER — Encounter: Payer: Self-pay | Admitting: Radiation Oncology

## 2017-10-06 ENCOUNTER — Ambulatory Visit
Admission: RE | Admit: 2017-10-06 | Discharge: 2017-10-06 | Disposition: A | Discharge status: Home / Self Care | Source: Ambulatory Visit | Attending: Radiation Oncology | Admitting: Radiation Oncology

## 2017-10-06 VITALS — BP 118/51 | HR 81 | Temp 97.8°F | Resp 20 | Ht 68.0 in | Wt 183.2 lb

## 2017-10-06 DIAGNOSIS — J449 Chronic obstructive pulmonary disease, unspecified: Secondary | ICD-10-CM | POA: Insufficient documentation

## 2017-10-06 DIAGNOSIS — C220 Liver cell carcinoma: Secondary | ICD-10-CM

## 2017-10-06 DIAGNOSIS — Z8673 Personal history of transient ischemic attack (TIA), and cerebral infarction without residual deficits: Secondary | ICD-10-CM | POA: Diagnosis not present

## 2017-10-06 DIAGNOSIS — F319 Bipolar disorder, unspecified: Secondary | ICD-10-CM | POA: Insufficient documentation

## 2017-10-06 DIAGNOSIS — Z79899 Other long term (current) drug therapy: Secondary | ICD-10-CM | POA: Diagnosis not present

## 2017-10-06 DIAGNOSIS — F419 Anxiety disorder, unspecified: Secondary | ICD-10-CM | POA: Insufficient documentation

## 2017-10-06 DIAGNOSIS — F17210 Nicotine dependence, cigarettes, uncomplicated: Secondary | ICD-10-CM | POA: Insufficient documentation

## 2017-10-06 DIAGNOSIS — Z818 Family history of other mental and behavioral disorders: Secondary | ICD-10-CM | POA: Insufficient documentation

## 2017-10-06 HISTORY — DX: Malignant (primary) neoplasm, unspecified: C80.1

## 2017-10-06 NOTE — Progress Notes (Signed)
Please see the Nurse Progress Note in the MD Initial Consult Encounter for this patient. 

## 2017-10-06 NOTE — Progress Notes (Signed)
[facility] Psychosocial Distress Screening Clinical Social Work  Clinical Social Work was referred by distress screening protocol.  The patient scored a 8 on the Psychosocial Distress Thermometer which indicates severe distress. Clinical Social Worker Edwyna Shell LCSW to assess for distress and other psychosocial needs. CSW met w patient and F[facility]on Reek [facility] staff member) in clinic room. Reviewed support services options, provided information on available transportation resources to assist.  Per patient and [facility] staff member, staff is able and willing to transport patient to all treatment visits.  Staff also provides transport to other medical and mental health appointments.  Patient lives in supportive housing environment, voiced no concerns/needs re living situation.  Patient expressed a positive outlook on coping w diagnosis, staff member voiced significant willingness to provide support.  CSW provided resources and contact information, encouraged patient and staff member to access support center resources as needed.    ONCBCN DISTRESS SCREENING 10/06/2017  Screening Type Initial Screening  Distress experienced in past week (1-10) 8  Emotional problem type Depression;Nervousness/Anxiety;Adjusting to illness  Physical Problem type Pain;Nausea/vomiting;Sleep/insomnia;Breathing;Loss of appetitie  Physician notified of physical symptoms Yes  Referral to clinical social work Yes  Referral to dietition Yes     Clinical Social Worker follow up needed: No.  [employer] staff willing to provide transportation, patient declined SCAT application.    If yes, follow up plan:

## 2017-10-06 NOTE — Progress Notes (Signed)
Radiation Oncology         [phone] ________________________________  Name: Patrick [name]        MRN: [id number]  Date of Service: 10/06/2017 DOB: Dec 18, 1950  [id number], Fransisca Kaufmann, MD  Ardath Sax, MD     REFERRING PHYSICIAN: Ardath Sax, MD   DIAGNOSIS: The encounter diagnosis was Hepatocellular carcinoma [facility]).   HISTORY OF PRESENT ILLNESS: Patrick [name] is a 66 y.o. male seen at the request of Dr. Lebron Conners for a probable advanced hepatocellular carcinoma. The patient was seen for progressive upper abdominal pain and cough on 09/13/17, and he was febrile and treated for suspected sepsis, and fortunately he didn't have any peritonitis. A CT scan of the abdomen revealed 4.1 cm mass in the left hepatic lobe. An MRI of the liver was obtained on 09/15/17 which revealed an 18.3 cm mass with associated tumor thrombus in the portal vein at the level of the portal venous confluence. The large mass involves both the medial segment of bilateral lobes, and moderate ascites was seen. His spleen was 14.9 cm, and CT of the chest on 09/13/17 also during his initial work up did not reveal any adenopathy. He had an AFP of >36,000. He is not a candidate for transplant, or for systemic therapy, or for Y-90/microablation. He comes today to discuss the options of palliative radiotherapy.   PREVIOUS RADIATION THERAPY: No   PAST MEDICAL HISTORY:  Past Medical History:  Diagnosis Date  . Anxiety   . Bipolar 1 disorder ([place])   . Cancer ([place]) 09/15/17 ME abdomen    liver cancer  . Cirrhosis ([place])   . COPD (chronic obstructive pulmonary disease) ([place])   . Depression   . Heart murmur   . Hepatitis C   . Oxygen deficiency   . Polysubstance abuse ([place])   . Stroke [facility])        PAST SURGICAL HISTORY: Past Surgical History:  Procedure Laterality Date  . arm surgery       FAMILY HISTORY:  Family History  Problem Relation Age of Onset  . Arthritis Mother   . Cancer Father    lung  . Diabetes Father   . Mental illness Brother        depression  . Heart disease Paternal Grandmother   . Cancer Paternal Grandmother        skin  . Diabetes Paternal Uncle      SOCIAL HISTORY:  reports that he has been smoking cigarettes.  He has a 12.50 pack-year smoking history. he has never used smokeless tobacco. He reports that he drinks alcohol. He reports that he does not use drugs.   ALLERGIES: Shellfish allergy and Tramadol   MEDICATIONS:  Current Outpatient Medications  Medication Sig Dispense Refill  . cyclobenzaprine (FLEXERIL) 5 MG tablet Take 1 tablet (5 mg total) 3 (three) times daily as needed by mouth for muscle spasms. 30 tablet 0  . lactulose (CHRONULAC) 10 GM/15ML solution Take 15 mLs (10 g total) by mouth 3 (three) times daily. (Patient taking differently: Take 20 g 3 (three) times daily by mouth. ) 240 mL 0  . lamoTRIgine (LAMICTAL) 200 MG tablet Take 200 mg daily by mouth.    . morphine (MSIR) 15 MG tablet Take 1 tablet (15 mg total) every 6 (six) hours as needed by mouth for severe pain. 50 tablet 0  . traZODone (DESYREL) 150 MG tablet Take 150 mg at bedtime by mouth.    . folic acid (FOLVITE)  1 MG tablet Take 1 tablet (1 mg total) by mouth daily. (Patient not taking: Reported on 09/27/2017) 30 tablet 0  . hydrOXYzine (VISTARIL) 100 MG capsule Take 100 mg 2 (two) times daily by mouth.    Marland Kitchen ibuprofen (ADVIL,MOTRIN) 200 MG tablet Take 400 mg every 6 (six) hours as needed by mouth.     No current facility-administered medications for this encounter.      REVIEW OF SYSTEMS: On review of systems, the patient reports that he is doing okay. He reports dry skin, along his lower lip. He reports right upper abdominal and lateral chest wall pain extending on the right. He reports improvement in pain when lying on his right side. He denies any sternal chest pain, shortness of breath, cough, additional fevers, chills, night sweats. He reports about 6 pounds of  unintended weight change in the last year. He denies any bowel or bladder disturbances, and denies abdominal pain, nausea or vomiting. He denies any new musculoskeletal or joint aches or pains. A complete review of systems is obtained and is otherwise negative.     PHYSICAL EXAM:  Wt Readings from Last 3 Encounters:  10/06/17 183 lb 3.2 oz (83.1 kg)  10/01/17 187 lb 11.2 oz (85.1 kg)  09/27/17 184 lb 1.6 oz (83.5 kg)   Temp Readings from Last 3 Encounters:  10/06/17 97.8 F (36.6 C) (Oral)  10/01/17 98.3 F (36.8 C) (Oral)  09/27/17 98.5 F (36.9 C) (Oral)   BP Readings from Last 3 Encounters:  10/06/17 (!) 118/51  10/01/17 (!) 119/54  09/27/17 (!) 138/59   Pulse Readings from Last 3 Encounters:  10/06/17 81  10/01/17 79  09/27/17 83   Pain Assessment Pain Score: 8  Pain Loc: Abdomen/10  In general this is a chronically ill appearing caucasian male in no acute distress. He is alert and oriented x4 and appropriate throughout the examination. HEENT reveals that the patient is normocephalic, atraumatic. EOMs are intact. PERRLA, with mild scleral icterus. Skin is intact without any evidence of gross lesions also with jaundice changes. Cardiovascular exam reveals a regular rate and rhythm, no clicks rubs or murmurs are auscultated. Chest is clear to auscultation bilaterally. Lymphatic assessment is performed and does not reveal any adenopathy in the cervical, supraclavicular, axillary, or inguinal chains. Abdomen has active bowel sounds in all quadrants and is intact. The abdomen is soft, but protuberant, and distended. The liver edge is palpable in the RUQ with mild discomfort. Lower extremities are negative for pretibial pitting edema, deep calf tenderness, cyanosis or clubbing.   ECOG = 1  0 - Asymptomatic (Fully active, able to carry on all predisease activities without restriction)  1 - Symptomatic but completely ambulatory (Restricted in physically strenuous activity but  ambulatory and able to carry out work of a light or sedentary nature. For example, light housework, office work)  2 - Symptomatic, <50% in bed during the day (Ambulatory and capable of all self care but unable to carry out any work activities. Up and about more than 50% of waking hours)  3 - Symptomatic, >50% in bed, but not bedbound (Capable of only limited self-care, confined to bed or chair 50% or more of waking hours)  4 - Bedbound (Completely disabled. Cannot carry on any self-care. Totally confined to bed or chair)  5 - Death   Eustace Pen MM, Creech RH, Tormey DC, et al. [phone]). "Toxicity and response criteria of the [facility]". [place] Oncol. 5 (6): [phone]  LABORATORY DATA:  Lab Results  Component Value Date   WBC 4.0 09/15/2017   HGB 11.9 (L) 09/15/2017   HCT 35.2 (L) 09/15/2017   MCV 83.4 09/15/2017   PLT 77 (L) 09/15/2017   Lab Results  Component Value Date   NA 137 09/15/2017   K 4.1 09/15/2017   CL 112 (H) 09/15/2017   CO2 21 (L) 09/15/2017   Lab Results  Component Value Date   ALT 33 09/15/2017   AST 87 (H) 09/15/2017   ALKPHOS 165 (H) 09/15/2017   BILITOT 2.0 (H) 09/15/2017      RADIOGRAPHY: Dg Chest 2 View  Result Date: 09/13/2017 CLINICAL DATA:  Fever and cough.  Right rib pain. EXAM: CHEST  2 VIEW COMPARISON:  07/14/2017.  03/22/2017. FINDINGS: Lungs are hyperexpanded. Interstitial markings are diffusely coarsened with chronic features. The lungs are clear without focal pneumonia, edema, pneumothorax or pleural effusion. Right hilar fullness is similar to prior studies and likely represents vascular anatomy. The visualized bony structures of the thorax are intact. IMPRESSION: Stable.  No acute findings. Electronically Signed   By: Misty Stanley M.D.   On: 09/13/2017 12:55   Ct Chest W Contrast  Result Date: 09/13/2017 CLINICAL DATA:  Cough, back pain. EXAM: CT CHEST, ABDOMEN, AND PELVIS WITH CONTRAST TECHNIQUE: Multidetector CT  imaging of the chest, abdomen and pelvis was performed following the standard protocol during bolus administration of intravenous contrast. CONTRAST:  117mL ISOVUE-300 IOPAMIDOL (ISOVUE-300) INJECTION 61% COMPARISON:  CT scan of February 16, 2006. FINDINGS: CT CHEST FINDINGS Cardiovascular: Atherosclerosis of thoracic aorta is noted without aneurysm or dissection. Coronary artery calcifications are noted. No pericardial effusion is noted. Normal cardiac size. Mediastinum/Nodes: No enlarged mediastinal, hilar, or axillary lymph nodes. Thyroid gland, trachea, and esophagus demonstrate no significant findings. Lungs/Pleura: Lungs are clear. No pleural effusion or pneumothorax. Musculoskeletal: No chest wall mass or suspicious bone lesions identified. CT ABDOMEN PELVIS FINDINGS Hepatobiliary: Heterogeneous appearance and nodular hepatic contours are noted consistent with hepatic cirrhosis. 4.1 x 3.8 cm low density is noted in left hepatic lobe concerning for possible neoplasm. Status post cholecystectomy. Pancreas: Unremarkable. No pancreatic ductal dilatation or surrounding inflammatory changes. Spleen: Mild splenomegaly is noted. Adrenals/Urinary Tract: Adrenal glands are unremarkable. Kidneys are normal, without renal calculi, focal lesion, or hydronephrosis. Bladder is unremarkable. Stomach/Bowel: Stomach is within normal limits. Appendix appears normal. No evidence of bowel wall thickening, distention, or inflammatory changes. Vascular/Lymphatic: Aortic atherosclerosis. No enlarged abdominal or pelvic lymph nodes. Reproductive: Prostate is unremarkable. Other: There appears to be portal venous thrombosis most likely due to hepatic cirrhosis. Collateral veins are seen around the spleen and pancreas as well as patent umbilical vein along the anterior abdominal wall. Musculoskeletal: No acute or significant osseous findings. IMPRESSION: Hepatic cirrhosis is noted with mild splenomegaly and enlarged collateral veins  consistent with portal hypertension. No contrast is seen in the main portal vein concerning for portal venous thrombosis. Doppler ultrasound may be performed for further evaluation. Aortic atherosclerosis. 4.1 x 3.8 cm low density is noted in left hepatic lobe which may represent hepatic cirrhosis, but hepatocellular carcinoma cannot be excluded. Further evaluation with MRI with and without gadolinium is recommended on nonemergent basis when patient can hold still and follow-up breathing instructions for the MRI. Electronically Signed   By: Marijo Conception, M.D.   On: 09/13/2017 16:09   Mr Abdomen W Wo Contrast  Result Date: 09/15/2017 CLINICAL DATA:  Cirrhosis.  Evaluate liver lesion. EXAM: MRI ABDOMEN WITHOUT AND WITH CONTRAST  TECHNIQUE: Multiplanar multisequence MR imaging of the abdomen was performed both before and after the administration of intravenous contrast. CONTRAST:  80mL MULTIHANCE GADOBENATE DIMEGLUMINE 529 MG/ML IV SOLN COMPARISON:  None. FINDINGS: Lower chest: No acute findings. Hepatobiliary: Advanced morphologic features of the liver compatible with cirrhosis. There is a large infiltrative mass involving both the medial segment of left lobe of liver and right lobe of liver. Mass measures approximately 18.3 by 9.8 by 14.3 cm (volume = 1300 cm^3). Evidence of tumor thrombus within the portal vein to the level of the portal venous confluence. Pancreas: No mass, inflammatory changes, or other parenchymal abnormality identified. Spleen:  Splenomegaly.  Spleen has a length of 14.9 cm. Adrenals/Urinary Tract: The adrenal glands are normal. Unremarkable appearance of the kidneys. Stomach/Bowel: Visualized portions within the abdomen are unremarkable. Vascular/Lymphatic: Normal appearance of the abdominal aorta. Large left upper quadrant gastric varices identified. Smaller esophageal varices noted. Re- cannulization and enlargement of the umbilical vein identified. Other: Moderate volume of ascites  identified within the upper abdomen. Musculoskeletal: No suspicious bone lesions identified. IMPRESSION: 1. Morphologic features a liver compatible with cirrhosis. 2. Large infiltrative mass involving right lobe of liver and medial segment of left lobe of liver and has an approximate volume of 1300 cc. Findings compatible with hepatocellular carcinoma with portal vein involvement. 3. Stigmata of portal venous hypertension including upper abdominal varices, ascites and splenomegaly. Electronically Signed   By: Kerby Moors M.D.   On: 09/15/2017 09:46   Ct Abdomen Pelvis W Contrast  Result Date: 09/13/2017 CLINICAL DATA:  Cough, back pain. EXAM: CT CHEST, ABDOMEN, AND PELVIS WITH CONTRAST TECHNIQUE: Multidetector CT imaging of the chest, abdomen and pelvis was performed following the standard protocol during bolus administration of intravenous contrast. CONTRAST:  133mL ISOVUE-300 IOPAMIDOL (ISOVUE-300) INJECTION 61% COMPARISON:  CT scan of February 16, 2006. FINDINGS: CT CHEST FINDINGS Cardiovascular: Atherosclerosis of thoracic aorta is noted without aneurysm or dissection. Coronary artery calcifications are noted. No pericardial effusion is noted. Normal cardiac size. Mediastinum/Nodes: No enlarged mediastinal, hilar, or axillary lymph nodes. Thyroid gland, trachea, and esophagus demonstrate no significant findings. Lungs/Pleura: Lungs are clear. No pleural effusion or pneumothorax. Musculoskeletal: No chest wall mass or suspicious bone lesions identified. CT ABDOMEN PELVIS FINDINGS Hepatobiliary: Heterogeneous appearance and nodular hepatic contours are noted consistent with hepatic cirrhosis. 4.1 x 3.8 cm low density is noted in left hepatic lobe concerning for possible neoplasm. Status post cholecystectomy. Pancreas: Unremarkable. No pancreatic ductal dilatation or surrounding inflammatory changes. Spleen: Mild splenomegaly is noted. Adrenals/Urinary Tract: Adrenal glands are unremarkable. Kidneys are normal,  without renal calculi, focal lesion, or hydronephrosis. Bladder is unremarkable. Stomach/Bowel: Stomach is within normal limits. Appendix appears normal. No evidence of bowel wall thickening, distention, or inflammatory changes. Vascular/Lymphatic: Aortic atherosclerosis. No enlarged abdominal or pelvic lymph nodes. Reproductive: Prostate is unremarkable. Other: There appears to be portal venous thrombosis most likely due to hepatic cirrhosis. Collateral veins are seen around the spleen and pancreas as well as patent umbilical vein along the anterior abdominal wall. Musculoskeletal: No acute or significant osseous findings. IMPRESSION: Hepatic cirrhosis is noted with mild splenomegaly and enlarged collateral veins consistent with portal hypertension. No contrast is seen in the main portal vein concerning for portal venous thrombosis. Doppler ultrasound may be performed for further evaluation. Aortic atherosclerosis. 4.1 x 3.8 cm low density is noted in left hepatic lobe which may represent hepatic cirrhosis, but hepatocellular carcinoma cannot be excluded. Further evaluation with MRI with and without gadolinium is recommended on  nonemergent basis when patient can hold still and follow-up breathing instructions for the MRI. Electronically Signed   By: Marijo Conception, M.D.   On: 09/13/2017 16:09   US Liver Doppler  Result Date: 09/14/2017 CLINICAL DATA:  Portal hypertension EXAM: DUPLEX ULTRASOUND OF LIVER TECHNIQUE: Color and duplex Doppler ultrasound was performed to evaluate the hepatic in-flow and out-flow vessels. COMPARISON:  None. FINDINGS: Portal Vein Velocities Thrombosed. Hepatic Vein Velocities Right:  11.8 cm/sec Middle:  32.0 cm/sec Left:  25.4 cm/sec Hepatic Artery Velocity:  207 cm/sec Splenic Vein Velocity:  13.4 cm/sec Varices: Varices are not clearly visualized on this study. Varices are present on the accompanying CT from yesterday. Ascites: A small amount of ascites is present about the liver.  The portal vein is hypoechoic. Color Doppler imaging confirms thrombosis. Right and left portal vein branches are also thrombosed. Varices noted on the accompanying CT are not clearly visualized on this study. The liver is diffusely heterogeneous compatible with diffuse hepatic parenchymal disease. Hepatic veins are patent with hepato fugal directionality of flow. IMPRESSION: Portal vein thrombosis. The patient has known portal vein thrombosis. Hepatic and splenic veins are patent. The liver is diffusely heterogeneous compatible with cirrhosis. Small amount of ascites. Electronically Signed   By: Marybelle Killings M.D.   On: 09/14/2017 11:04       IMPRESSION/PLAN: 1. Hepatocellular carcinoma. Given the clinical and radiographic findings, he appears to have [place]. Dr. Lisbeth Renshaw reviews the size of the tumor is quite large and discusses the role of palliative radiotherapy for his symptoms of pain. Dr. Lisbeth Renshaw reviews the risks, benefits, short, and long term effects of treatment and recommends 8 consecutive daily treatments to the liver. We will plan for him to return on Wednesday next week for simulation. Written consent is obtained and placed in the chart, a copy was provided to the patient.   The above documentation reflects my direct findings during this shared patient visit. Please see the separate note by Dr. Lisbeth Renshaw on this date for the remainder of the patient's plan of care.    Carola Rhine, PAC

## 2017-10-08 ENCOUNTER — Encounter: Payer: Self-pay | Admitting: Radiation Oncology

## 2017-10-08 DIAGNOSIS — C220 Liver cell carcinoma: Secondary | ICD-10-CM

## 2017-10-08 NOTE — Progress Notes (Signed)
In addition to his discussion regarding treatment, we discussed his options of hospice as well following radiotherapy. At this time he wishes to avoid this as he remains independent, but is open to their resources. We also discussed code status, and he elects for DNR status. Copies of this are provided so he can have one at home and one on his person.     Carola Rhine, PAC

## 2017-10-12 ENCOUNTER — Telehealth: Payer: Self-pay | Admitting: Radiation Oncology

## 2017-10-12 NOTE — Telephone Encounter (Signed)
I called pt to find out why he cancelled his simulation and left him contact information to reschedule and to contact me if he has concerns about treatment or planning.

## 2017-10-12 NOTE — Telephone Encounter (Signed)
Patient left voicemail message cancelling his simulation appointment for tomorrow at 3 pm.

## 2017-10-13 ENCOUNTER — Ambulatory Visit: Admitting: Radiation Oncology

## 2017-10-15 NOTE — Progress Notes (Signed)
[facility] Follow-up Visit:  Assessment: Hepatocellular carcinoma ([place]) 66 y.o. male with locally advanced hepatocellular carcinoma with a very large mass in the center of the liver with associated portal vein tumor-thrombus.  Based on the size of the tumor, patient is not amenable to transvascular therapies.  Size of the tumor also precludes transplantation as a treatment of choice even though no extrahepatic disease is obvious at this time.  The calculated Child-Pugh for the patient is 9 placing him in category B.  During the visit, patient does complain of some confusion and mentation difficulties which are likely due to hepatic encephalopathy that would transition him to child Pugh C category.  In the setting, there are no systemic therapies at all for benefit in terms of quality of life or survival outside of potential for palliative radiation to the mass to alleviate the pain brought upon by invasion and compression.  My current recommendation is to proceed with hospice care as I do not have any effective strategy to treat the patient at this time.  Plan: --Consult radiation oncology for possible palliative radiation into the hepatic mass to reduce the symptoms --Consult hospice --Return to my clinic in a couple of days to review the recommendations with the patient and his brother as well as to review effectiveness of morphine for pain.   Voice recognition software was used and creation of this note. Despite my best effort at editing the text, some misspelling/errors may have occurred.  Orders Placed This Encounter  Procedures  . Ambulatory referral to Hospice    Referral Priority:   Routine    Referral Type:   Consultation    Referral Reason:   Specialty Services Required    Requested Specialty:   Hospice Services    Number of Visits Requested:   1  . Ambulatory referral to Radiation Oncology    Referral Priority:   Routine    Referral Type:   Consultation     Referral Reason:   Specialty Services Required    Requested Specialty:   Radiation Oncology    Number of Visits Requested:   1    Cancer Staging Hepatocellular carcinoma [facility]) Staging form: Liver, AJCC 8th Edition - Clinical: Stage IIIB (cT4, cN0, cM0) - Signed by Kyung Rudd, MD on 10/06/2017   All questions were answered.  . The patient knows to call the clinic with any problems, questions or concerns.  This note was electronically signed.    History of Presenting Illness Alen F Genco 66 y.o. presenting to the [facility] for follow-up of hepatocellular carcinoma diagnosed during the recent hospital stay.  This time, patient reports increasing pain in the abdomen and the right flank.  He denies any active nausea or vomiting.  Otherwise feeling reasonably well.  Trying to make a decision regarding management choices for his advanced malignancy.  Medical History: Past Medical History:  Diagnosis Date  . Anxiety   . Bipolar 1 disorder ([place])   . 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 Cirrhosis ([place])   . COPD (chronic obstructive pulmonary disease) ([place])   . Depression   . Heart murmur   . Hepatitis C   . Oxygen deficiency   . Polysubstance abuse ([place])   . Stroke [facility])     Surgical History: Past Surgical History:  Procedure Laterality Date  . arm surgery      Family History: Family History  Problem Relation Age of Onset  . Arthritis  Mother   . Cancer Father        lung  . Diabetes Father   . Mental illness Brother        depression  . Heart disease Paternal Grandmother   . Cancer Paternal Grandmother        skin  . Diabetes Paternal Uncle     Social History: Social History   Socioeconomic History  . Marital status: Divorced    Spouse name: Not on file  . Number of children: Not on file  . Years of education: Not on file  . Highest education level: Not on file  Social Needs  . Financial resource strain: Not on file  . Food insecurity  - worry: Not on file  . Food insecurity - inability: Not on file  . Transportation needs - medical: Not on file  . Transportation needs - non-medical: Not on file  Occupational History  . Not on file  Tobacco Use  . Smoking status: Current Every Day Smoker    Packs/day: 0.25    Years: 50.00    Pack years: 12.50    Types: Cigarettes  . Smokeless tobacco: Never Used  Substance and Sexual Activity  . Alcohol use: Yes    Comment: 26 months alcohol free- relapse on 07/10/17  . Drug use: No  . Sexual activity: No    Birth control/protection: Condom  Other Topics Concern  . Not on file  Social History Narrative  . Not on file    Allergies: Allergies  Allergen Reactions  . Shellfish Allergy Hives  . Tramadol Itching    Medications:  Current Outpatient Medications  Medication Sig Dispense Refill  . cyclobenzaprine (FLEXERIL) 5 MG tablet Take 1 tablet (5 mg total) 3 (three) times daily as needed by mouth for muscle spasms. 30 tablet 0  . hydrOXYzine (VISTARIL) 100 MG capsule Take 100 mg 2 (two) times daily by mouth.    Marland Kitchen ibuprofen (ADVIL,MOTRIN) 200 MG tablet Take 400 mg every 6 (six) hours as needed by mouth.    . lactulose (CHRONULAC) 10 GM/15ML solution Take 15 mLs (10 g total) by mouth 3 (three) times daily. (Patient taking differently: Take 20 g 3 (three) times daily by mouth. ) 240 mL 0  . lamoTRIgine (LAMICTAL) 200 MG tablet Take 200 mg daily by mouth.    . traZODone (DESYREL) 150 MG tablet Take 150 mg at bedtime by mouth.    . folic acid (FOLVITE) 1 MG tablet Take 1 tablet (1 mg total) by mouth daily. (Patient not taking: Reported on 09/27/2017) 30 tablet 0  . morphine (MSIR) 15 MG tablet Take 1 tablet (15 mg total) every 6 (six) hours as needed by mouth for severe pain. 50 tablet 0   No current facility-administered medications for this visit.     Review of Systems: Review of Systems - Oncology   PHYSICAL EXAMINATION Blood pressure (!) 138/59, pulse 83, temperature  98.5 F (36.9 C), temperature source Oral, resp. rate 18, height _0  (1.727 m), weight 184 lb 1.6 oz (83.5 kg), SpO2 92 %.  ECOG PERFORMANCE STATUS: 2 - Symptomatic, <50% confined to bed  Physical Exam  Constitutional: He is oriented to person, place, and time.  Patient appears ill, fatigued.  HENT:  Head: Normocephalic.  Mouth/Throat: Oropharynx is clear and moist. No oropharyngeal exudate.  Eyes: Conjunctivae and EOM are normal. Pupils are equal, round, and reactive to light. Scleral icterus is present.  Neck: No thyromegaly present.  Cardiovascular: Normal rate, regular  rhythm, normal heart sounds and intact distal pulses.  No murmur heard. Pulmonary/Chest: Effort normal and breath sounds normal. No respiratory distress. He has no wheezes. He has no rales.  Abdominal: Soft. He exhibits distension. There is no tenderness. There is no rebound.  Ascites present.  Abdomen soft.  Musculoskeletal: He exhibits edema.  Lymphadenopathy:    He has no cervical adenopathy.  Neurological: He is alert and oriented to person, place, and time. He has normal reflexes. No cranial nerve deficit.     LABORATORY DATA: I have personally reviewed the data as listed: No visits with results within 1 Week(s) from this visit.  Latest known visit with results is:  Admission on 09/13/2017, Discharged on 09/16/2017  Component Date Value Ref Range Status  . Sodium 09/13/2017 138  135 - 145 mmol/L Final  . Potassium 09/13/2017 4.1  3.5 - 5.1 mmol/L Final  . Chloride 09/13/2017 110  101 - 111 mmol/L Final  . CO2 09/13/2017 23  22 - 32 mmol/L Final  . Glucose, Bld 09/13/2017 131* 65 - 99 mg/dL Final  . BUN 09/13/2017 9  6 - 20 mg/dL Final  . Creatinine, Ser 09/13/2017 1.26* 0.61 - 1.24 mg/dL Final  . Calcium 09/13/2017 8.4* 8.9 - 10.3 mg/dL Final  . Total Protein 09/13/2017 6.3* 6.5 - 8.1 g/dL Final  . Albumin 09/13/2017 2.9* 3.5 - 5.0 g/dL Final  . AST 09/13/2017 76* 15 - 41 U/L Final  . ALT 09/13/2017  33  17 - 63 U/L Final  . Alkaline Phosphatase 09/13/2017 216* 38 - 126 U/L Final  . Total Bilirubin 09/13/2017 1.8* 0.3 - 1.2 mg/dL Final  . GFR calc non Af Amer 09/13/2017 59* >60 mL/min Final  . GFR calc Af Amer 09/13/2017 >60  >60 mL/min Final   Comment: (NOTE) The eGFR has been calculated using the CKD EPI equation. This calculation has not been validated in all clinical situations. eGFR's persistently <60 mL/min signify possible Chronic Kidney Disease.   . Anion gap 09/13/2017 5  5 - 15 Final  . Lactic Acid, Venous 09/13/2017 2.41* 0.5 - 1.9 mmol/L Final  . Comment 09/13/2017 NOTIFIED PHYSICIAN   Final  . WBC 09/13/2017 3.8* 4.0 - 10.5 K/uL Final  . RBC 09/13/2017 4.39  4.22 - 5.81 MIL/uL Final  . Hemoglobin 09/13/2017 12.6* 13.0 - 17.0 g/dL Final  . HCT 09/13/2017 36.9* 39.0 - 52.0 % Final  . MCV 09/13/2017 84.1  78.0 - 100.0 fL Final  . MCH 09/13/2017 28.7  26.0 - 34.0 pg Final  . MCHC 09/13/2017 34.1  30.0 - 36.0 g/dL Final  . RDW 09/13/2017 19.4* 11.5 - 15.5 % Final  . Platelets 09/13/2017 84* 150 - 400 K/uL Final   Comment: REPEATED TO VERIFY SPECIMEN CHECKED FOR CLOTS PLATELET COUNT CONFIRMED BY SMEAR   . Neutrophils Relative % 09/13/2017 61  % Final  . Neutro Abs 09/13/2017 2.3  1.7 - 7.7 K/uL Final  . Lymphocytes Relative 09/13/2017 23  % Final  . Lymphs Abs 09/13/2017 0.9  0.7 - 4.0 K/uL Final  . Monocytes Relative 09/13/2017 14  % Final  . Monocytes Absolute 09/13/2017 0.5  0.1 - 1.0 K/uL Final  . Eosinophils Relative 09/13/2017 3  % Final  . Eosinophils Absolute 09/13/2017 0.1  0.0 - 0.7 K/uL Final  . Basophils Relative 09/13/2017 0  % Final  . Basophils Absolute 09/13/2017 0.0  0.0 - 0.1 K/uL Final  . Prothrombin Time 09/13/2017 15.6* 11.4 - 15.2 seconds Final  .  INR 09/13/2017 1.25   Final  . Color, Urine 09/13/2017 YELLOW  YELLOW Final  . APPearance 09/13/2017 CLEAR  CLEAR Final  . Specific Gravity, Urine 09/13/2017 1.016  1.005 - 1.030 Final  . pH  09/13/2017 6.0  5.0 - 8.0 Final  . Glucose, UA 09/13/2017 NEGATIVE  NEGATIVE mg/dL Final  . Hgb urine dipstick 09/13/2017 NEGATIVE  NEGATIVE Final  . Bilirubin Urine 09/13/2017 NEGATIVE  NEGATIVE Final  . Ketones, ur 09/13/2017 NEGATIVE  NEGATIVE mg/dL Final  . Protein, ur 09/13/2017 NEGATIVE  NEGATIVE mg/dL Final  . Nitrite 09/13/2017 NEGATIVE  NEGATIVE Final  . Leukocytes, UA 09/13/2017 NEGATIVE  NEGATIVE Final  . Lactic Acid, Venous 09/13/2017 1.60  0.5 - 1.9 mmol/L Final  . Specimen Description 09/13/2017 BLOOD LEFT HAND   Final  . Special Requests 09/13/2017 BOTTLES DRAWN AEROBIC AND ANAEROBIC Blood Culture adequate volume   Final  . Culture 09/13/2017 NO GROWTH 5 DAYS   Final  . Report Status 09/13/2017 09/18/2017 FINAL   Final  . Specimen Description 09/13/2017 BLOOD RIGHT HAND   Final  . Special Requests 09/13/2017 BOTTLES DRAWN AEROBIC AND ANAEROBIC Blood Culture results may not be optimal due to an excessive volume of blood received in culture bottles   Final  . Culture 09/13/2017 NO GROWTH 5 DAYS   Final  . Report Status 09/13/2017 09/18/2017 FINAL   Final  . Influenza A By PCR 09/13/2017 NEGATIVE  NEGATIVE Final  . Influenza B By PCR 09/13/2017 NEGATIVE  NEGATIVE Final   Comment: (NOTE) The Xpert Xpress Flu assay is intended as an aid in the diagnosis of  influenza and should not be used as a sole basis for treatment.  This  assay is FDA approved for nasopharyngeal swab specimens only. Nasal  washings and aspirates are unacceptable for Xpert Xpress Flu testing.   . Lactic Acid, Venous 09/13/2017 0.90  0.5 - 1.9 mmol/L Final  . Lactic Acid, Venous 09/13/2017 1.06  0.5 - 1.9 mmol/L Final  . Specimen Description 09/13/2017 URINE, RANDOM   Final  . Special Requests 09/13/2017 NONE   Final  . Culture 09/13/2017 NO GROWTH   Final  . Report Status 09/13/2017 09/14/2017 FINAL   Final  . Magnesium 09/13/2017 1.9  1.7 - 2.4 mg/dL Final  . Phosphorus 09/13/2017 2.2* 2.5 - 4.6 mg/dL  Final  . Sodium 09/14/2017 139  135 - 145 mmol/L Final  . Potassium 09/14/2017 3.7  3.5 - 5.1 mmol/L Final  . Chloride 09/14/2017 114* 101 - 111 mmol/L Final  . CO2 09/14/2017 22  22 - 32 mmol/L Final  . Glucose, Bld 09/14/2017 74  65 - 99 mg/dL Final  . BUN 09/14/2017 9  6 - 20 mg/dL Final  . Creatinine, Ser 09/14/2017 1.14  0.61 - 1.24 mg/dL Final  . Calcium 09/14/2017 7.9* 8.9 - 10.3 mg/dL Final  . Total Protein 09/14/2017 5.7* 6.5 - 8.1 g/dL Final  . Albumin 09/14/2017 2.4* 3.5 - 5.0 g/dL Final  . AST 09/14/2017 84* 15 - 41 U/L Final  . ALT 09/14/2017 35  17 - 63 U/L Final  . Alkaline Phosphatase 09/14/2017 151* 38 - 126 U/L Final  . Total Bilirubin 09/14/2017 2.0* 0.3 - 1.2 mg/dL Final  . GFR calc non Af Amer 09/14/2017 >60  >60 mL/min Final  . GFR calc Af Amer 09/14/2017 >60  >60 mL/min Final   Comment: (NOTE) The eGFR has been calculated using the CKD EPI equation. This calculation has not been validated in all  clinical situations. eGFR's persistently <60 mL/min signify possible Chronic Kidney Disease.   . Anion gap 09/14/2017 3* 5 - 15 Final  . WBC 09/14/2017 2.6* 4.0 - 10.5 K/uL Final  . RBC 09/14/2017 3.90* 4.22 - 5.81 MIL/uL Final  . Hemoglobin 09/14/2017 10.7* 13.0 - 17.0 g/dL Final  . HCT 09/14/2017 32.6* 39.0 - 52.0 % Final  . MCV 09/14/2017 83.6  78.0 - 100.0 fL Final  . MCH 09/14/2017 27.4  26.0 - 34.0 pg Final  . MCHC 09/14/2017 32.8  30.0 - 36.0 g/dL Final  . RDW 09/14/2017 19.3* 11.5 - 15.5 % Final  . Platelets 09/14/2017 78* 150 - 400 K/uL Final   CONSISTENT WITH PREVIOUS RESULT  . Prothrombin Time 09/14/2017 16.9* 11.4 - 15.2 seconds Final  . INR 09/14/2017 1.38   Final  . MRSA by PCR 09/13/2017 NEGATIVE  NEGATIVE Final   Comment:        The GeneXpert MRSA Assay (FDA approved for NASAL specimens only), is one component of a comprehensive MRSA colonization surveillance program. It is not intended to diagnose MRSA infection nor to guide or monitor  treatment for MRSA infections.   . Ammonia 09/14/2017 72* 9 - 35 umol/L Final  . GGT 09/14/2017 158* 7 - 50 U/L Final  . Lipase 09/14/2017 28  11 - 51 U/L Final  . AFP, Serum, Tumor Marker 09/14/2017 36,749.0* 0.0 - 8.3 ng/mL Final   Comment: (NOTE) Results confirmed on dilution. Roche ECLIA methodology Performed At: [facility] [place], [place] [id number] Rush Farmer MD [id number]   . AFP, Serum, Tumor Marker 09/15/2017 38,564.0* 0.0 - 8.3 ng/mL Final   Comment: (NOTE) Results confirmed on dilution. Roche ECLIA methodology Performed At: [facility] [place], [place] [id number] Rush Farmer MD [id number]   . WBC 09/15/2017 4.0  4.0 - 10.5 K/uL Final  . RBC 09/15/2017 4.22  4.22 - 5.81 MIL/uL Final  . Hemoglobin 09/15/2017 11.9* 13.0 - 17.0 g/dL Final  . HCT 09/15/2017 35.2* 39.0 - 52.0 % Final  . MCV 09/15/2017 83.4  78.0 - 100.0 fL Final  . MCH 09/15/2017 28.2  26.0 - 34.0 pg Final  . MCHC 09/15/2017 33.8  30.0 - 36.0 g/dL Final  . RDW 09/15/2017 19.1* 11.5 - 15.5 % Final  . Platelets 09/15/2017 77* 150 - 400 K/uL Final   Comment: REPEATED TO VERIFY CONSISTENT WITH PREVIOUS RESULT   . Sodium 09/15/2017 137  135 - 145 mmol/L Final  . Potassium 09/15/2017 4.1  3.5 - 5.1 mmol/L Final   HEMOLYSIS AT THIS LEVEL MAY AFFECT RESULT  . Chloride 09/15/2017 112* 101 - 111 mmol/L Final  . CO2 09/15/2017 21* 22 - 32 mmol/L Final  . Glucose, Bld 09/15/2017 124* 65 - 99 mg/dL Final  . BUN 09/15/2017 9  6 - 20 mg/dL Final  . Creatinine, Ser 09/15/2017 1.20  0.61 - 1.24 mg/dL Final  . Calcium 09/15/2017 8.0* 8.9 - 10.3 mg/dL Final  . Total Protein 09/15/2017 6.1* 6.5 - 8.1 g/dL Final  . Albumin 09/15/2017 2.5* 3.5 - 5.0 g/dL Final  . AST 09/15/2017 87* 15 - 41 U/L Final  . ALT 09/15/2017 33  17 - 63 U/L Final  . Alkaline Phosphatase 09/15/2017 165* 38 - 126 U/L Final  . Total Bilirubin 09/15/2017 2.0* 0.3 - 1.2 mg/dL Final   . GFR calc non Af Amer 09/15/2017 >60  >60 mL/min Final  . GFR calc Af Amer 09/15/2017 >60  >60 mL/min  Final   Comment: (NOTE) The eGFR has been calculated using the CKD EPI equation. This calculation has not been validated in all clinical situations. eGFR's persistently <60 mL/min signify possible Chronic Kidney Disease.   Georgiann Hahn gap 09/15/2017 4* 5 - 15 Final       Ardath Sax, MD

## 2017-10-15 NOTE — Assessment & Plan Note (Signed)
66 y.o. male with locally advanced hepatocellular carcinoma with a very large mass in the center of the liver with associated portal vein tumor-thrombus.  Based on the size of the tumor, patient is not amenable to transvascular therapies.  Size of the tumor also precludes transplantation as a treatment of choice even though no extrahepatic disease is obvious at this time.  The calculated Child-Pugh for the patient is 9 placing him in category B.  During the visit, patient does complain of some confusion and mentation difficulties which are likely due to hepatic encephalopathy that would transition him to child Pugh C category.  In the setting, there are no systemic therapies at all for benefit in terms of quality of life or survival outside of potential for palliative radiation to the mass to alleviate the pain brought upon by invasion and compression.  My current recommendation is to proceed with hospice care as I do not have any effective strategy to treat the patient at this time.  Plan: --Consult radiation oncology for possible palliative radiation into the hepatic mass to reduce the symptoms --Consult hospice --Return to my clinic in a couple of days to review the recommendations with the patient and his brother as well as to review effectiveness of morphine for pain.

## 2017-10-17 NOTE — Progress Notes (Signed)
[facility] Follow-up Visit:  Assessment: No problem-specific Assessment & Plan notes found for this encounter.  Voice recognition software was used and creation of this note. Despite my best effort at editing the text, some misspelling/errors may have occurred.  No orders of the defined types were placed in this encounter.   Cancer Staging Hepatocellular carcinoma ([place]) Staging form: Liver, AJCC 8th Edition - Clinical: Stage IIIB (cT4, cN0, cM0) - Signed by Kyung Rudd, MD on 10/06/2017   All questions were answered.  . The patient knows to call the clinic with any problems, questions or concerns.  This note was electronically signed.    History of Presenting Illness Patrick [name] 66 y.o. presenting to the [facility] for follow-up of hepatocellular carcinoma diagnosed during the recent hospital stay.  This time, patient reports increasing pain in the abdomen and the right flank.  He denies any active nausea or vomiting.  Otherwise feeling reasonably well.  Trying to make a decision regarding management choices for his advanced malignancy.  Pain control improve significantly with initiation of morphine. No interim pruritus. Patient feels comfortable with idea of hospice care at this time.  Medical History: Past Medical History:  Diagnosis Date  . Anxiety   . Bipolar 1 disorder ([place])   . Cancer ([place]) 09/15/17 ME abdomen    liver cancer  . Cirrhosis ([place])   . COPD (chronic obstructive pulmonary disease) ([place])   . Depression   . Heart murmur   . Hepatitis C   . Oxygen deficiency   . Polysubstance abuse ([place])   . Stroke [facility])     Surgical History: Past Surgical History:  Procedure Laterality Date  . arm surgery      Family History: Family History  Problem Relation Age of Onset  . Arthritis Mother   . Cancer Father        lung  . Diabetes Father   . Mental illness Brother        depression  . Heart disease Paternal Grandmother   .  Cancer Paternal Grandmother        skin  . Diabetes Paternal Uncle     Social History: Social History   Socioeconomic History  . Marital status: Divorced    Spouse name: Not on file  . Number of children: Not on file  . Years of education: Not on file  . Highest education level: Not on file  Social Needs  . Financial resource strain: Not on file  . Food insecurity - worry: Not on file  . Food insecurity - inability: Not on file  . Transportation needs - medical: Not on file  . Transportation needs - non-medical: Not on file  Occupational History  . Not on file  Tobacco Use  . Smoking status: Current Every Day Smoker    Packs/day: 0.25    Years: 50.00    Pack years: 12.50    Types: Cigarettes  . Smokeless tobacco: Never Used  Substance and Sexual Activity  . Alcohol use: Yes    Comment: 26 months alcohol free- relapse on 07/10/17  . Drug use: No  . Sexual activity: No    Birth control/protection: Condom  Other Topics Concern  . Not on file  Social History Narrative  . Not on file    Allergies: Allergies  Allergen Reactions  . Shellfish Allergy Hives  . Tramadol Itching    Medications:  Current Outpatient Medications  Medication Sig Dispense Refill  . cyclobenzaprine (FLEXERIL)  5 MG tablet Take 1 tablet (5 mg total) 3 (three) times daily as needed by mouth for muscle spasms. 30 tablet 0  . hydrOXYzine (VISTARIL) 100 MG capsule Take 100 mg 2 (two) times daily by mouth.    Marland Kitchen ibuprofen (ADVIL,MOTRIN) 200 MG tablet Take 400 mg every 6 (six) hours as needed by mouth.    . lactulose (CHRONULAC) 10 GM/15ML solution Take 15 mLs (10 g total) by mouth 3 (three) times daily. (Patient taking differently: Take 20 g 3 (three) times daily by mouth. ) 240 mL 0  . lamoTRIgine (LAMICTAL) 200 MG tablet Take 200 mg daily by mouth.    . morphine (MSIR) 15 MG tablet Take 1 tablet (15 mg total) every 6 (six) hours as needed by mouth for severe pain. 50 tablet 0  . traZODone (DESYREL)  150 MG tablet Take 150 mg at bedtime by mouth.    . folic acid (FOLVITE) 1 MG tablet Take 1 tablet (1 mg total) by mouth daily. (Patient not taking: Reported on 09/27/2017) 30 tablet 0   No current facility-administered medications for this visit.     Review of Systems: Review of Systems - Oncology   PHYSICAL EXAMINATION Blood pressure (!) 119/54, pulse 79, temperature 98.3 F (36.8 C), temperature source Oral, resp. rate 18, height '5\' 8"'$  (1.727 m), weight 187 lb 11.2 oz (85.1 kg), SpO2 92 %.  ECOG PERFORMANCE STATUS: 2 - Symptomatic, <50% confined to bed  Physical Exam  Constitutional: He is oriented to person, place, and time.  Patient appears ill, fatigued.  HENT:  Head: Normocephalic.  Mouth/Throat: Oropharynx is clear and moist. No oropharyngeal exudate.  Eyes: Conjunctivae and EOM are normal. Pupils are equal, round, and reactive to light.  Neck: No thyromegaly present.  Cardiovascular: Normal rate, regular rhythm, normal heart sounds and intact distal pulses.  No murmur heard. Pulmonary/Chest: Effort normal and breath sounds normal. No respiratory distress. He has no wheezes. He has no rales.  Abdominal: Soft. He exhibits distension. There is no tenderness. There is no rebound.  Ascites present.  Abdomen soft.  Musculoskeletal: He exhibits edema.  Lymphadenopathy:    He has no cervical adenopathy.  Neurological: He is alert and oriented to person, place, and time. He has normal reflexes. No cranial nerve deficit.     LABORATORY DATA: I have personally reviewed the data as listed: No visits with results within 1 Week(s) from this visit.  Latest known visit with results is:  Admission on 09/13/2017, Discharged on 09/16/2017  Component Date Value Ref Range Status  . Sodium 09/13/2017 138  135 - 145 mmol/L Final  . Potassium 09/13/2017 4.1  3.5 - 5.1 mmol/L Final  . Chloride 09/13/2017 110  101 - 111 mmol/L Final  . CO2 09/13/2017 23  22 - 32 mmol/L Final  . Glucose,  Bld 09/13/2017 131* 65 - 99 mg/dL Final  . BUN 09/13/2017 9  6 - 20 mg/dL Final  . Creatinine, Ser 09/13/2017 1.26* 0.61 - 1.24 mg/dL Final  . Calcium 09/13/2017 8.4* 8.9 - 10.3 mg/dL Final  . Total Protein 09/13/2017 6.3* 6.5 - 8.1 g/dL Final  . Albumin 09/13/2017 2.9* 3.5 - 5.0 g/dL Final  . AST 09/13/2017 76* 15 - 41 U/L Final  . ALT 09/13/2017 33  17 - 63 U/L Final  . Alkaline Phosphatase 09/13/2017 216* 38 - 126 U/L Final  . Total Bilirubin 09/13/2017 1.8* 0.3 - 1.2 mg/dL Final  . GFR calc non Af Amer 09/13/2017 59* >60 mL/min  Final  . GFR calc Af Amer 09/13/2017 >60  >60 mL/min Final   Comment: (NOTE) The eGFR has been calculated using the CKD EPI equation. This calculation has not been validated in all clinical situations. eGFR's persistently <60 mL/min signify possible Chronic Kidney Disease.   . Anion gap 09/13/2017 5  5 - 15 Final  . Lactic Acid, Venous 09/13/2017 2.41* 0.5 - 1.9 mmol/L Final  . Comment 09/13/2017 NOTIFIED PHYSICIAN   Final  . WBC 09/13/2017 3.8* 4.0 - 10.5 K/uL Final  . RBC 09/13/2017 4.39  4.22 - 5.81 MIL/uL Final  . Hemoglobin 09/13/2017 12.6* 13.0 - 17.0 g/dL Final  . HCT 09/13/2017 36.9* 39.0 - 52.0 % Final  . MCV 09/13/2017 84.1  78.0 - 100.0 fL Final  . MCH 09/13/2017 28.7  26.0 - 34.0 pg Final  . MCHC 09/13/2017 34.1  30.0 - 36.0 g/dL Final  . RDW 09/13/2017 19.4* 11.5 - 15.5 % Final  . Platelets 09/13/2017 84* 150 - 400 K/uL Final   Comment: REPEATED TO VERIFY SPECIMEN CHECKED FOR CLOTS PLATELET COUNT CONFIRMED BY SMEAR   . Neutrophils Relative % 09/13/2017 61  % Final  . Neutro Abs 09/13/2017 2.3  1.7 - 7.7 K/uL Final  . Lymphocytes Relative 09/13/2017 23  % Final  . Lymphs Abs 09/13/2017 0.9  0.7 - 4.0 K/uL Final  . Monocytes Relative 09/13/2017 14  % Final  . Monocytes Absolute 09/13/2017 0.5  0.1 - 1.0 K/uL Final  . Eosinophils Relative 09/13/2017 3  % Final  . Eosinophils Absolute 09/13/2017 0.1  0.0 - 0.7 K/uL Final  . Basophils  Relative 09/13/2017 0  % Final  . Basophils Absolute 09/13/2017 0.0  0.0 - 0.1 K/uL Final  . Prothrombin Time 09/13/2017 15.6* 11.4 - 15.2 seconds Final  . INR 09/13/2017 1.25   Final  . Color, Urine 09/13/2017 YELLOW  YELLOW Final  . APPearance 09/13/2017 CLEAR  CLEAR Final  . Specific Gravity, Urine 09/13/2017 1.016  1.005 - 1.030 Final  . pH 09/13/2017 6.0  5.0 - 8.0 Final  . Glucose, UA 09/13/2017 NEGATIVE  NEGATIVE mg/dL Final  . Hgb urine dipstick 09/13/2017 NEGATIVE  NEGATIVE Final  . Bilirubin Urine 09/13/2017 NEGATIVE  NEGATIVE Final  . Ketones, ur 09/13/2017 NEGATIVE  NEGATIVE mg/dL Final  . Protein, ur 09/13/2017 NEGATIVE  NEGATIVE mg/dL Final  . Nitrite 09/13/2017 NEGATIVE  NEGATIVE Final  . Leukocytes, UA 09/13/2017 NEGATIVE  NEGATIVE Final  . Lactic Acid, Venous 09/13/2017 1.60  0.5 - 1.9 mmol/L Final  . Specimen Description 09/13/2017 BLOOD LEFT HAND   Final  . Special Requests 09/13/2017 BOTTLES DRAWN AEROBIC AND ANAEROBIC Blood Culture adequate volume   Final  . Culture 09/13/2017 NO GROWTH 5 DAYS   Final  . Report Status 09/13/2017 09/18/2017 FINAL   Final  . Specimen Description 09/13/2017 BLOOD RIGHT HAND   Final  . Special Requests 09/13/2017 BOTTLES DRAWN AEROBIC AND ANAEROBIC Blood Culture results may not be optimal due to an excessive volume of blood received in culture bottles   Final  . Culture 09/13/2017 NO GROWTH 5 DAYS   Final  . Report Status 09/13/2017 09/18/2017 FINAL   Final  . Influenza A By PCR 09/13/2017 NEGATIVE  NEGATIVE Final  . Influenza B By PCR 09/13/2017 NEGATIVE  NEGATIVE Final   Comment: (NOTE) The Xpert Xpress Flu assay is intended as an aid in the diagnosis of  influenza and should not be used as a sole basis for treatment.  This  assay is FDA approved for nasopharyngeal swab specimens only. Nasal  washings and aspirates are unacceptable for Xpert Xpress Flu testing.   . Lactic Acid, Venous 09/13/2017 0.90  0.5 - 1.9 mmol/L Final  .  Lactic Acid, Venous 09/13/2017 1.06  0.5 - 1.9 mmol/L Final  . Specimen Description 09/13/2017 URINE, RANDOM   Final  . Special Requests 09/13/2017 NONE   Final  . Culture 09/13/2017 NO GROWTH   Final  . Report Status 09/13/2017 09/14/2017 FINAL   Final  . Magnesium 09/13/2017 1.9  1.7 - 2.4 mg/dL Final  . Phosphorus 09/13/2017 2.2* 2.5 - 4.6 mg/dL Final  . Sodium 09/14/2017 139  135 - 145 mmol/L Final  . Potassium 09/14/2017 3.7  3.5 - 5.1 mmol/L Final  . Chloride 09/14/2017 114* 101 - 111 mmol/L Final  . CO2 09/14/2017 22  22 - 32 mmol/L Final  . Glucose, Bld 09/14/2017 74  65 - 99 mg/dL Final  . BUN 09/14/2017 9  6 - 20 mg/dL Final  . Creatinine, Ser 09/14/2017 1.14  0.61 - 1.24 mg/dL Final  . Calcium 09/14/2017 7.9* 8.9 - 10.3 mg/dL Final  . Total Protein 09/14/2017 5.7* 6.5 - 8.1 g/dL Final  . Albumin 09/14/2017 2.4* 3.5 - 5.0 g/dL Final  . AST 09/14/2017 84* 15 - 41 U/L Final  . ALT 09/14/2017 35  17 - 63 U/L Final  . Alkaline Phosphatase 09/14/2017 151* 38 - 126 U/L Final  . Total Bilirubin 09/14/2017 2.0* 0.3 - 1.2 mg/dL Final  . GFR calc non Af Amer 09/14/2017 >60  >60 mL/min Final  . GFR calc Af Amer 09/14/2017 >60  >60 mL/min Final   Comment: (NOTE) The eGFR has been calculated using the CKD EPI equation. This calculation has not been validated in all clinical situations. eGFR's persistently <60 mL/min signify possible Chronic Kidney Disease.   . Anion gap 09/14/2017 3* 5 - 15 Final  . WBC 09/14/2017 2.6* 4.0 - 10.5 K/uL Final  . RBC 09/14/2017 3.90* 4.22 - 5.81 MIL/uL Final  . Hemoglobin 09/14/2017 10.7* 13.0 - 17.0 g/dL Final  . HCT 09/14/2017 32.6* 39.0 - 52.0 % Final  . MCV 09/14/2017 83.6  78.0 - 100.0 fL Final  . MCH 09/14/2017 27.4  26.0 - 34.0 pg Final  . MCHC 09/14/2017 32.8  30.0 - 36.0 g/dL Final  . RDW 09/14/2017 19.3* 11.5 - 15.5 % Final  . Platelets 09/14/2017 78* 150 - 400 K/uL Final   CONSISTENT WITH PREVIOUS RESULT  . Prothrombin Time 09/14/2017  16.9* 11.4 - 15.2 seconds Final  . INR 09/14/2017 1.38   Final  . MRSA by PCR 09/13/2017 NEGATIVE  NEGATIVE Final   Comment:        The GeneXpert MRSA Assay (FDA approved for NASAL specimens only), is one component of a comprehensive MRSA colonization surveillance program. It is not intended to diagnose MRSA infection nor to guide or monitor treatment for MRSA infections.   . Ammonia 09/14/2017 72* 9 - 35 umol/L Final  . GGT 09/14/2017 158* 7 - 50 U/L Final  . Lipase 09/14/2017 28  11 - 51 U/L Final  . AFP, Serum, Tumor Marker 09/14/2017 36,749.0* 0.0 - 8.3 ng/mL Final   Comment: (NOTE) Results confirmed on dilution. Roche ECLIA methodology Performed At: [facility] [place], [place] [id number] Rush Farmer MD [id number]   . AFP, Serum, Tumor Marker 09/15/2017 38,564.0* 0.0 - 8.3 ng/mL Final   Comment: (NOTE) Results confirmed on dilution. Roche ECLIA  methodology Performed At: [facility] [address] [place], [place] [id number] Rush Farmer MD [id number]   . WBC 09/15/2017 4.0  4.0 - 10.5 K/uL Final  . RBC 09/15/2017 4.22  4.22 - 5.81 MIL/uL Final  . Hemoglobin 09/15/2017 11.9* 13.0 - 17.0 g/dL Final  . HCT 09/15/2017 35.2* 39.0 - 52.0 % Final  . MCV 09/15/2017 83.4  78.0 - 100.0 fL Final  . MCH 09/15/2017 28.2  26.0 - 34.0 pg Final  . MCHC 09/15/2017 33.8  30.0 - 36.0 g/dL Final  . RDW 09/15/2017 19.1* 11.5 - 15.5 % Final  . Platelets 09/15/2017 77* 150 - 400 K/uL Final   Comment: REPEATED TO VERIFY CONSISTENT WITH PREVIOUS RESULT   . Sodium 09/15/2017 137  135 - 145 mmol/L Final  . Potassium 09/15/2017 4.1  3.5 - 5.1 mmol/L Final   HEMOLYSIS AT THIS LEVEL MAY AFFECT RESULT  . Chloride 09/15/2017 112* 101 - 111 mmol/L Final  . CO2 09/15/2017 21* 22 - 32 mmol/L Final  . Glucose, Bld 09/15/2017 124* 65 - 99 mg/dL Final  . BUN 09/15/2017 9  6 - 20 mg/dL Final  . Creatinine, Ser 09/15/2017 1.20  0.61 - 1.24 mg/dL Final   . Calcium 09/15/2017 8.0* 8.9 - 10.3 mg/dL Final  . Total Protein 09/15/2017 6.1* 6.5 - 8.1 g/dL Final  . Albumin 09/15/2017 2.5* 3.5 - 5.0 g/dL Final  . AST 09/15/2017 87* 15 - 41 U/L Final  . ALT 09/15/2017 33  17 - 63 U/L Final  . Alkaline Phosphatase 09/15/2017 165* 38 - 126 U/L Final  . Total Bilirubin 09/15/2017 2.0* 0.3 - 1.2 mg/dL Final  . GFR calc non Af Amer 09/15/2017 >60  >60 mL/min Final  . GFR calc Af Amer 09/15/2017 >60  >60 mL/min Final   Comment: (NOTE) The eGFR has been calculated using the CKD EPI equation. This calculation has not been validated in all clinical situations. eGFR's persistently <60 mL/min signify possible Chronic Kidney Disease.   Georgiann Hahn gap 09/15/2017 4* 5 - 15 Final       Ardath Sax, MD

## 2017-10-17 NOTE — Assessment & Plan Note (Addendum)
66 y.o. male with locally advanced hepatocellular carcinoma with a very large mass in the center of the liver with associated portal vein tumor-thrombus.  Based on the size of the tumor, patient is not amenable to transvascular therapies.  Size of the tumor also precludes transplantation as a treatment of choice even though no extrahepatic disease is obvious at this time.  The calculated Child-Pugh for the patient is 9 placing him in category B.  During the visit, patient does complain of some confusion and mentation difficulties which are likely due to hepatic encephalopathy that would transition him to child Pugh C category.  In the setting, there are no systemic therapies at all for benefit in terms of quality of life or survival outside of potential for palliative radiation to the mass to alleviate the pain brought upon by invasion and compression.  My current recommendation is to proceed with hospice care as I do not have any effective strategy to treat the patient at this time.  Plan: --Consult radiation oncology for possible palliative radiation into the hepatic mass to reduce the symptoms --Consult hospice

## 2017-10-17 NOTE — Progress Notes (Signed)
[facility] Follow-up Visit:  Assessment: Hepatocellular carcinoma ([place]) 66 y.o. male with locally advanced hepatocellular carcinoma with a very large mass in the center of the liver with associated portal vein tumor-thrombus.  Based on the size of the tumor, patient is not amenable to transvascular therapies.  Size of the tumor also precludes transplantation as a treatment of choice even though no extrahepatic disease is obvious at this time.  The calculated Child-Pugh for the patient is 9 placing him in category B.  During the visit, patient does complain of some confusion and mentation difficulties which are likely due to hepatic encephalopathy that would transition him to child Pugh C category.  In the setting, there are no systemic therapies at all for benefit in terms of quality of life or survival outside of potential for palliative radiation to the mass to alleviate the pain brought upon by invasion and compression.  My current recommendation is to proceed with hospice care as I do not have any effective strategy to treat the patient at this time.  Plan: --Consult radiation oncology for possible palliative radiation into the hepatic mass to reduce the symptoms --Consult hospice   Voice recognition software was used and creation of this note. Despite my best effort at editing the text, some misspelling/errors may have occurred.  No orders of the defined types were placed in this encounter.   Cancer Staging Hepatocellular carcinoma ([place]) Staging form: Liver, AJCC 8th Edition - Clinical: Stage IIIB (cT4, cN0, cM0) - Signed by Kyung Rudd, MD on 10/06/2017   All questions were answered.  . The patient knows to call the clinic with any problems, questions or concerns.  This note was electronically signed.    History of Presenting Illness Patrick [name] 66 y.o. presenting to the [facility] for follow-up of hepatocellular carcinoma diagnosed during the  recent hospital stay.  This time, patient reports increasing pain in the abdomen and the right flank.  He denies any active nausea or vomiting.  Otherwise feeling reasonably well.  Trying to make a decision regarding management choices for his advanced malignancy.  Pain control improve significantly with initiation of morphine. No interim pruritus. Patient feels comfortable with idea of hospice care at this time.  Medical History: Past Medical History:  Diagnosis Date  . Anxiety   . Bipolar 1 disorder ([place])   . Cancer ([place]) 09/15/17 ME abdomen    liver cancer  . Cirrhosis ([place])   . COPD (chronic obstructive pulmonary disease) ([place])   . Depression   . Heart murmur   . Hepatitis C   . Oxygen deficiency   . Polysubstance abuse ([place])   . Stroke [facility])     Surgical History: Past Surgical History:  Procedure Laterality Date  . arm surgery      Family History: Family History  Problem Relation Age of Onset  . Arthritis Mother   . Cancer Father        lung  . Diabetes Father   . Mental illness Brother        depression  . Heart disease Paternal Grandmother   . Cancer Paternal Grandmother        skin  . Diabetes Paternal Uncle     Social History: Social History   Socioeconomic History  . Marital status: Divorced    Spouse name: Not on file  . Number of children: Not on file  . Years of education: Not on file  . Highest education level: Not on  file  Social Needs  . Financial resource strain: Not on file  . Food insecurity - worry: Not on file  . Food insecurity - inability: Not on file  . Transportation needs - medical: Not on file  . Transportation needs - non-medical: Not on file  Occupational History  . Not on file  Tobacco Use  . Smoking status: Current Every Day Smoker    Packs/day: 0.25    Years: 50.00    Pack years: 12.50    Types: Cigarettes  . Smokeless tobacco: Never Used  Substance and Sexual Activity  . Alcohol use: Yes    Comment: 26 months alcohol  free- relapse on 07/10/17  . Drug use: No  . Sexual activity: No    Birth control/protection: Condom  Other Topics Concern  . Not on file  Social History Narrative  . Not on file    Allergies: Allergies  Allergen Reactions  . Shellfish Allergy Hives  . Tramadol Itching    Medications:  Current Outpatient Medications  Medication Sig Dispense Refill  . cyclobenzaprine (FLEXERIL) 5 MG tablet Take 1 tablet (5 mg total) 3 (three) times daily as needed by mouth for muscle spasms. 30 tablet 0  . hydrOXYzine (VISTARIL) 100 MG capsule Take 100 mg 2 (two) times daily by mouth.    Marland Kitchen ibuprofen (ADVIL,MOTRIN) 200 MG tablet Take 400 mg every 6 (six) hours as needed by mouth.    . lactulose (CHRONULAC) 10 GM/15ML solution Take 15 mLs (10 g total) by mouth 3 (three) times daily. (Patient taking differently: Take 20 g 3 (three) times daily by mouth. ) 240 mL 0  . lamoTRIgine (LAMICTAL) 200 MG tablet Take 200 mg daily by mouth.    . morphine (MSIR) 15 MG tablet Take 1 tablet (15 mg total) every 6 (six) hours as needed by mouth for severe pain. 50 tablet 0  . traZODone (DESYREL) 150 MG tablet Take 150 mg at bedtime by mouth.    . folic acid (FOLVITE) 1 MG tablet Take 1 tablet (1 mg total) by mouth daily. (Patient not taking: Reported on 09/27/2017) 30 tablet 0   No current facility-administered medications for this visit.     Review of Systems: Review of Systems - Oncology   PHYSICAL EXAMINATION Blood pressure (!) 119/54, pulse 79, temperature 98.3 F (36.8 C), temperature source Oral, resp. rate 18, height '5\' 8"'$  (1.727 m), weight 187 lb 11.2 oz (85.1 kg), SpO2 92 %.  ECOG PERFORMANCE STATUS: 2 - Symptomatic, <50% confined to bed  Physical Exam  Constitutional: He is oriented to person, place, and time.  Patient appears ill, fatigued.  HENT:  Head: Normocephalic.  Mouth/Throat: Oropharynx is clear and moist. No oropharyngeal exudate.  Eyes: Conjunctivae and EOM are normal. Pupils are  equal, round, and reactive to light.  Neck: No thyromegaly present.  Cardiovascular: Normal rate, regular rhythm, normal heart sounds and intact distal pulses.  No murmur heard. Pulmonary/Chest: Effort normal and breath sounds normal. No respiratory distress. He has no wheezes. He has no rales.  Abdominal: Soft. He exhibits distension. There is no tenderness. There is no rebound.  Ascites present.  Abdomen soft.  Musculoskeletal: He exhibits edema.  Lymphadenopathy:    He has no cervical adenopathy.  Neurological: He is alert and oriented to person, place, and time. He has normal reflexes. No cranial nerve deficit.     LABORATORY DATA: I have personally reviewed the data as listed: No visits with results within 1 Week(s) from this visit.  Latest known visit with results is:  Admission on 09/13/2017, Discharged on 09/16/2017  Component Date Value Ref Range Status  . Sodium 09/13/2017 138  135 - 145 mmol/L Final  . Potassium 09/13/2017 4.1  3.5 - 5.1 mmol/L Final  . Chloride 09/13/2017 110  101 - 111 mmol/L Final  . CO2 09/13/2017 23  22 - 32 mmol/L Final  . Glucose, Bld 09/13/2017 131* 65 - 99 mg/dL Final  . BUN 09/13/2017 9  6 - 20 mg/dL Final  . Creatinine, Ser 09/13/2017 1.26* 0.61 - 1.24 mg/dL Final  . Calcium 09/13/2017 8.4* 8.9 - 10.3 mg/dL Final  . Total Protein 09/13/2017 6.3* 6.5 - 8.1 g/dL Final  . Albumin 09/13/2017 2.9* 3.5 - 5.0 g/dL Final  . AST 09/13/2017 76* 15 - 41 U/L Final  . ALT 09/13/2017 33  17 - 63 U/L Final  . Alkaline Phosphatase 09/13/2017 216* 38 - 126 U/L Final  . Total Bilirubin 09/13/2017 1.8* 0.3 - 1.2 mg/dL Final  . GFR calc non Af Amer 09/13/2017 59* >60 mL/min Final  . GFR calc Af Amer 09/13/2017 >60  >60 mL/min Final   Comment: (NOTE) The eGFR has been calculated using the CKD EPI equation. This calculation has not been validated in all clinical situations. eGFR's persistently <60 mL/min signify possible Chronic Kidney Disease.   . Anion gap  09/13/2017 5  5 - 15 Final  . Lactic Acid, Venous 09/13/2017 2.41* 0.5 - 1.9 mmol/L Final  . Comment 09/13/2017 NOTIFIED PHYSICIAN   Final  . WBC 09/13/2017 3.8* 4.0 - 10.5 K/uL Final  . RBC 09/13/2017 4.39  4.22 - 5.81 MIL/uL Final  . Hemoglobin 09/13/2017 12.6* 13.0 - 17.0 g/dL Final  . HCT 09/13/2017 36.9* 39.0 - 52.0 % Final  . MCV 09/13/2017 84.1  78.0 - 100.0 fL Final  . MCH 09/13/2017 28.7  26.0 - 34.0 pg Final  . MCHC 09/13/2017 34.1  30.0 - 36.0 g/dL Final  . RDW 09/13/2017 19.4* 11.5 - 15.5 % Final  . Platelets 09/13/2017 84* 150 - 400 K/uL Final   Comment: REPEATED TO VERIFY SPECIMEN CHECKED FOR CLOTS PLATELET COUNT CONFIRMED BY SMEAR   . Neutrophils Relative % 09/13/2017 61  % Final  . Neutro Abs 09/13/2017 2.3  1.7 - 7.7 K/uL Final  . Lymphocytes Relative 09/13/2017 23  % Final  . Lymphs Abs 09/13/2017 0.9  0.7 - 4.0 K/uL Final  . Monocytes Relative 09/13/2017 14  % Final  . Monocytes Absolute 09/13/2017 0.5  0.1 - 1.0 K/uL Final  . Eosinophils Relative 09/13/2017 3  % Final  . Eosinophils Absolute 09/13/2017 0.1  0.0 - 0.7 K/uL Final  . Basophils Relative 09/13/2017 0  % Final  . Basophils Absolute 09/13/2017 0.0  0.0 - 0.1 K/uL Final  . Prothrombin Time 09/13/2017 15.6* 11.4 - 15.2 seconds Final  . INR 09/13/2017 1.25   Final  . Color, Urine 09/13/2017 YELLOW  YELLOW Final  . APPearance 09/13/2017 CLEAR  CLEAR Final  . Specific Gravity, Urine 09/13/2017 1.016  1.005 - 1.030 Final  . pH 09/13/2017 6.0  5.0 - 8.0 Final  . Glucose, UA 09/13/2017 NEGATIVE  NEGATIVE mg/dL Final  . Hgb urine dipstick 09/13/2017 NEGATIVE  NEGATIVE Final  . Bilirubin Urine 09/13/2017 NEGATIVE  NEGATIVE Final  . Ketones, ur 09/13/2017 NEGATIVE  NEGATIVE mg/dL Final  . Protein, ur 09/13/2017 NEGATIVE  NEGATIVE mg/dL Final  . Nitrite 09/13/2017 NEGATIVE  NEGATIVE Final  . Leukocytes, UA 09/13/2017 NEGATIVE  NEGATIVE Final  . Lactic Acid, Venous 09/13/2017 1.60  0.5 - 1.9 mmol/L Final  .  Specimen Description 09/13/2017 BLOOD LEFT HAND   Final  . Special Requests 09/13/2017 BOTTLES DRAWN AEROBIC AND ANAEROBIC Blood Culture adequate volume   Final  . Culture 09/13/2017 NO GROWTH 5 DAYS   Final  . Report Status 09/13/2017 09/18/2017 FINAL   Final  . Specimen Description 09/13/2017 BLOOD RIGHT HAND   Final  . Special Requests 09/13/2017 BOTTLES DRAWN AEROBIC AND ANAEROBIC Blood Culture results may not be optimal due to an excessive volume of blood received in culture bottles   Final  . Culture 09/13/2017 NO GROWTH 5 DAYS   Final  . Report Status 09/13/2017 09/18/2017 FINAL   Final  . Influenza A By PCR 09/13/2017 NEGATIVE  NEGATIVE Final  . Influenza B By PCR 09/13/2017 NEGATIVE  NEGATIVE Final   Comment: (NOTE) The Xpert Xpress Flu assay is intended as an aid in the diagnosis of  influenza and should not be used as a sole basis for treatment.  This  assay is FDA approved for nasopharyngeal swab specimens only. Nasal  washings and aspirates are unacceptable for Xpert Xpress Flu testing.   . Lactic Acid, Venous 09/13/2017 0.90  0.5 - 1.9 mmol/L Final  . Lactic Acid, Venous 09/13/2017 1.06  0.5 - 1.9 mmol/L Final  . Specimen Description 09/13/2017 URINE, RANDOM   Final  . Special Requests 09/13/2017 NONE   Final  . Culture 09/13/2017 NO GROWTH   Final  . Report Status 09/13/2017 09/14/2017 FINAL   Final  . Magnesium 09/13/2017 1.9  1.7 - 2.4 mg/dL Final  . Phosphorus 09/13/2017 2.2* 2.5 - 4.6 mg/dL Final  . Sodium 09/14/2017 139  135 - 145 mmol/L Final  . Potassium 09/14/2017 3.7  3.5 - 5.1 mmol/L Final  . Chloride 09/14/2017 114* 101 - 111 mmol/L Final  . CO2 09/14/2017 22  22 - 32 mmol/L Final  . Glucose, Bld 09/14/2017 74  65 - 99 mg/dL Final  . BUN 09/14/2017 9  6 - 20 mg/dL Final  . Creatinine, Ser 09/14/2017 1.14  0.61 - 1.24 mg/dL Final  . Calcium 09/14/2017 7.9* 8.9 - 10.3 mg/dL Final  . Total Protein 09/14/2017 5.7* 6.5 - 8.1 g/dL Final  . Albumin 09/14/2017 2.4*  3.5 - 5.0 g/dL Final  . AST 09/14/2017 84* 15 - 41 U/L Final  . ALT 09/14/2017 35  17 - 63 U/L Final  . Alkaline Phosphatase 09/14/2017 151* 38 - 126 U/L Final  . Total Bilirubin 09/14/2017 2.0* 0.3 - 1.2 mg/dL Final  . GFR calc non Af Amer 09/14/2017 >60  >60 mL/min Final  . GFR calc Af Amer 09/14/2017 >60  >60 mL/min Final   Comment: (NOTE) The eGFR has been calculated using the CKD EPI equation. This calculation has not been validated in all clinical situations. eGFR's persistently <60 mL/min signify possible Chronic Kidney Disease.   . Anion gap 09/14/2017 3* 5 - 15 Final  . WBC 09/14/2017 2.6* 4.0 - 10.5 K/uL Final  . RBC 09/14/2017 3.90* 4.22 - 5.81 MIL/uL Final  . Hemoglobin 09/14/2017 10.7* 13.0 - 17.0 g/dL Final  . HCT 09/14/2017 32.6* 39.0 - 52.0 % Final  . MCV 09/14/2017 83.6  78.0 - 100.0 fL Final  . MCH 09/14/2017 27.4  26.0 - 34.0 pg Final  . MCHC 09/14/2017 32.8  30.0 - 36.0 g/dL Final  . RDW 09/14/2017 19.3* 11.5 - 15.5 % Final  . Platelets 09/14/2017  78* 150 - 400 K/uL Final   CONSISTENT WITH PREVIOUS RESULT  . Prothrombin Time 09/14/2017 16.9* 11.4 - 15.2 seconds Final  . INR 09/14/2017 1.38   Final  . MRSA by PCR 09/13/2017 NEGATIVE  NEGATIVE Final   Comment:        The GeneXpert MRSA Assay (FDA approved for NASAL specimens only), is one component of a comprehensive MRSA colonization surveillance program. It is not intended to diagnose MRSA infection nor to guide or monitor treatment for MRSA infections.   . Ammonia 09/14/2017 72* 9 - 35 umol/L Final  . GGT 09/14/2017 158* 7 - 50 U/L Final  . Lipase 09/14/2017 28  11 - 51 U/L Final  . AFP, Serum, Tumor Marker 09/14/2017 36,749.0* 0.0 - 8.3 ng/mL Final   Comment: (NOTE) Results confirmed on dilution. Roche ECLIA methodology Performed At: [facility] [place], [place] [id number] Rush Farmer MD [id number]   . AFP, Serum, Tumor Marker 09/15/2017 38,564.0* 0.0 - 8.3 ng/mL  Final   Comment: (NOTE) Results confirmed on dilution. Roche ECLIA methodology Performed At: [facility] [place], [place] [id number] Rush Farmer MD [id number]   . WBC 09/15/2017 4.0  4.0 - 10.5 K/uL Final  . RBC 09/15/2017 4.22  4.22 - 5.81 MIL/uL Final  . Hemoglobin 09/15/2017 11.9* 13.0 - 17.0 g/dL Final  . HCT 09/15/2017 35.2* 39.0 - 52.0 % Final  . MCV 09/15/2017 83.4  78.0 - 100.0 fL Final  . MCH 09/15/2017 28.2  26.0 - 34.0 pg Final  . MCHC 09/15/2017 33.8  30.0 - 36.0 g/dL Final  . RDW 09/15/2017 19.1* 11.5 - 15.5 % Final  . Platelets 09/15/2017 77* 150 - 400 K/uL Final   Comment: REPEATED TO VERIFY CONSISTENT WITH PREVIOUS RESULT   . Sodium 09/15/2017 137  135 - 145 mmol/L Final  . Potassium 09/15/2017 4.1  3.5 - 5.1 mmol/L Final   HEMOLYSIS AT THIS LEVEL MAY AFFECT RESULT  . Chloride 09/15/2017 112* 101 - 111 mmol/L Final  . CO2 09/15/2017 21* 22 - 32 mmol/L Final  . Glucose, Bld 09/15/2017 124* 65 - 99 mg/dL Final  . BUN 09/15/2017 9  6 - 20 mg/dL Final  . Creatinine, Ser 09/15/2017 1.20  0.61 - 1.24 mg/dL Final  . Calcium 09/15/2017 8.0* 8.9 - 10.3 mg/dL Final  . Total Protein 09/15/2017 6.1* 6.5 - 8.1 g/dL Final  . Albumin 09/15/2017 2.5* 3.5 - 5.0 g/dL Final  . AST 09/15/2017 87* 15 - 41 U/L Final  . ALT 09/15/2017 33  17 - 63 U/L Final  . Alkaline Phosphatase 09/15/2017 165* 38 - 126 U/L Final  . Total Bilirubin 09/15/2017 2.0* 0.3 - 1.2 mg/dL Final  . GFR calc non Af Amer 09/15/2017 >60  >60 mL/min Final  . GFR calc Af Amer 09/15/2017 >60  >60 mL/min Final   Comment: (NOTE) The eGFR has been calculated using the CKD EPI equation. This calculation has not been validated in all clinical situations. eGFR's persistently <60 mL/min signify possible Chronic Kidney Disease.   Georgiann Hahn gap 09/15/2017 4* 5 - 15 Final       Ardath Sax, MD

## 2017-12-10 DEATH — deceased

## 2019-11-01 IMAGING — US US HEPATIC LIVER DOPPLER
1 series · 13 of 25 positions shown · non-contrast
Comparison: None.

CLINICAL DATA: Portal hypertension

EXAM:
DUPLEX ULTRASOUND OF LIVER
TECHNIQUE: Color and duplex Doppler ultrasound was performed to evaluate the
hepatic in-flow and out-flow vessels.

[Series 1: us hepatic liver doppler · 0.26mm/px · 13 of 49 slices shown]
[im 1/49]
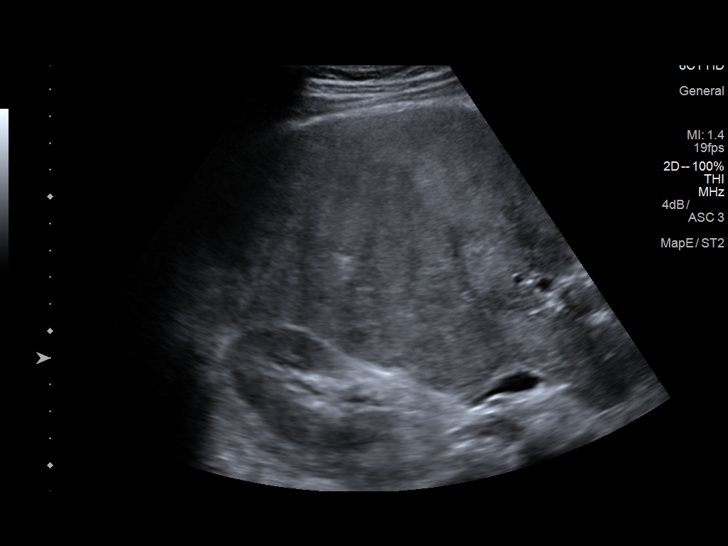
[im 5/49]
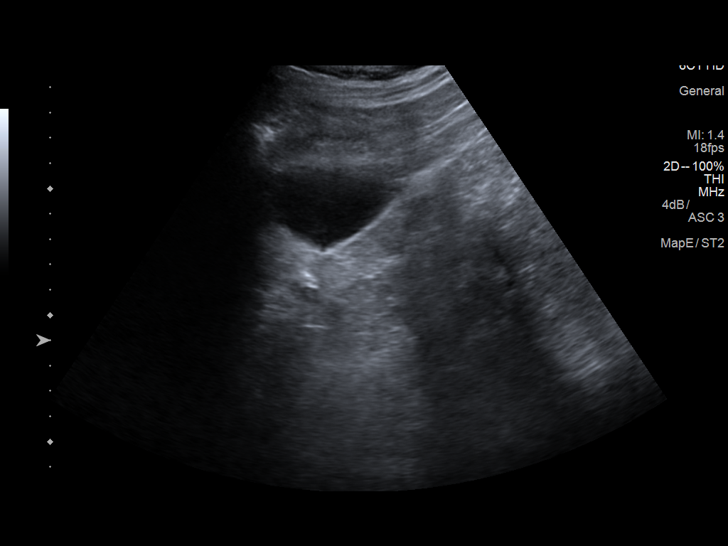
[im 9/49]
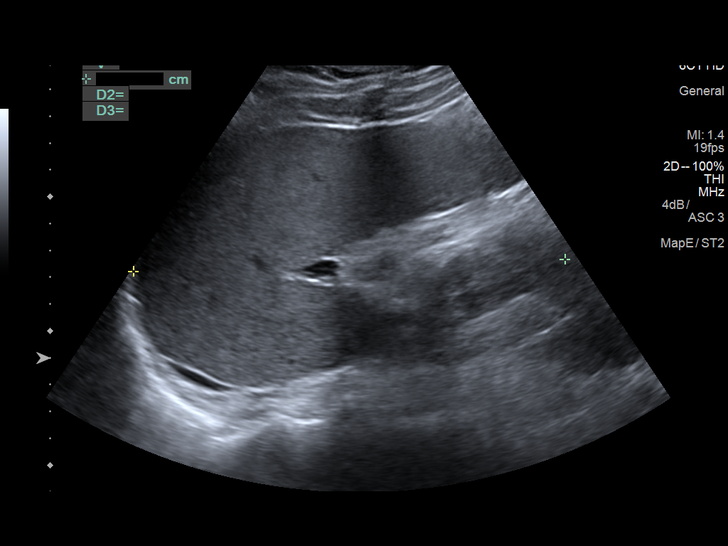
[im 13/49]
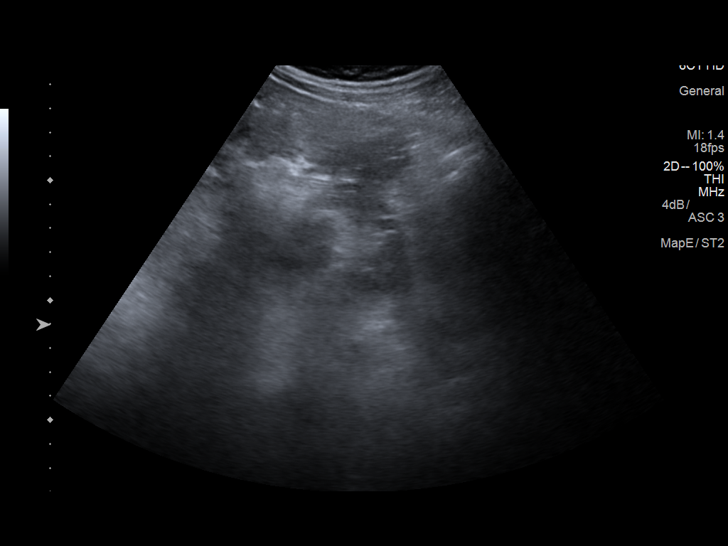
[im 17/49]
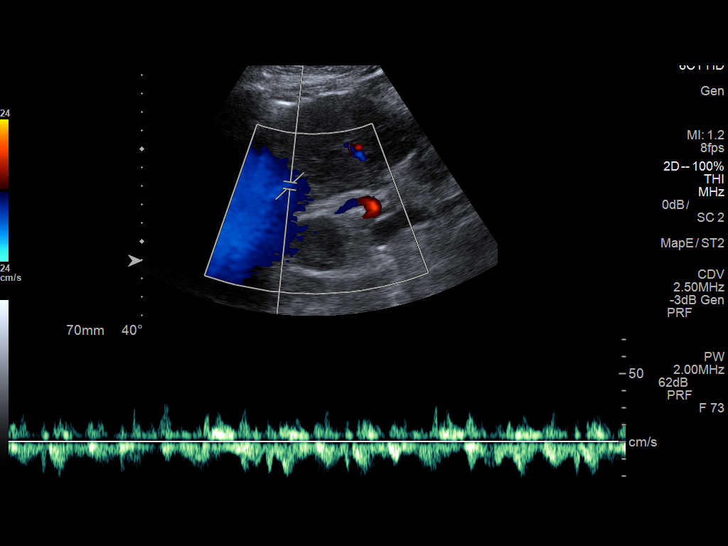
[im 21/49]
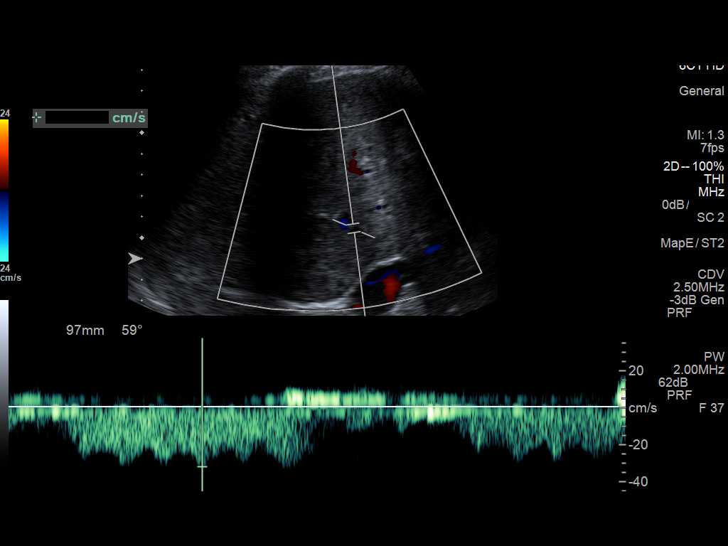
[im 25/49]
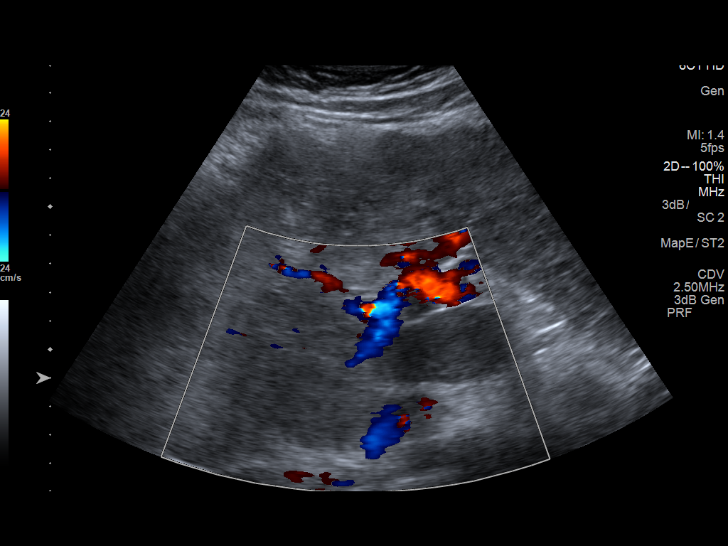
[im 29/49]
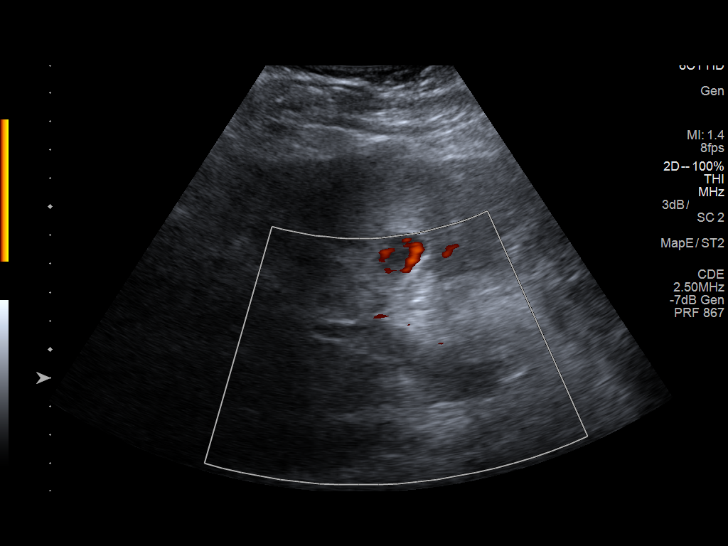
[im 33/49]
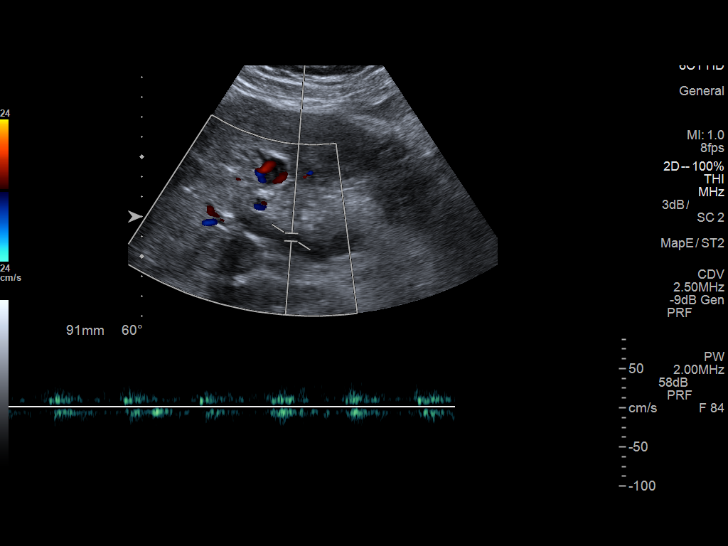
[im 37/49]
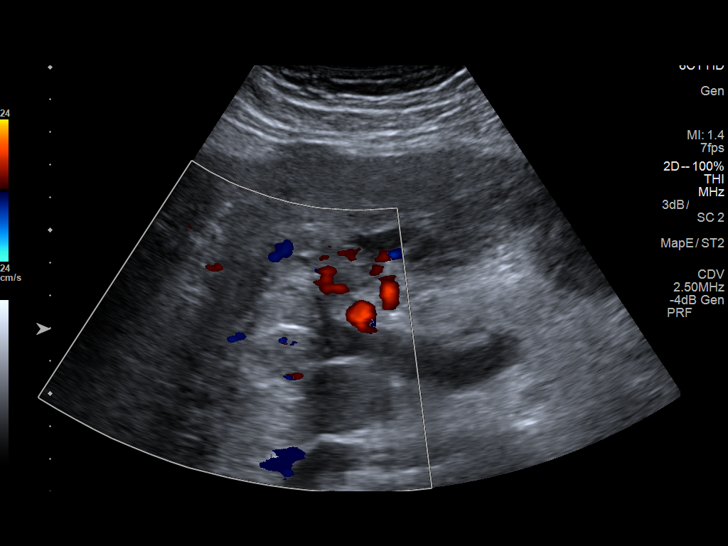
[im 41/49]
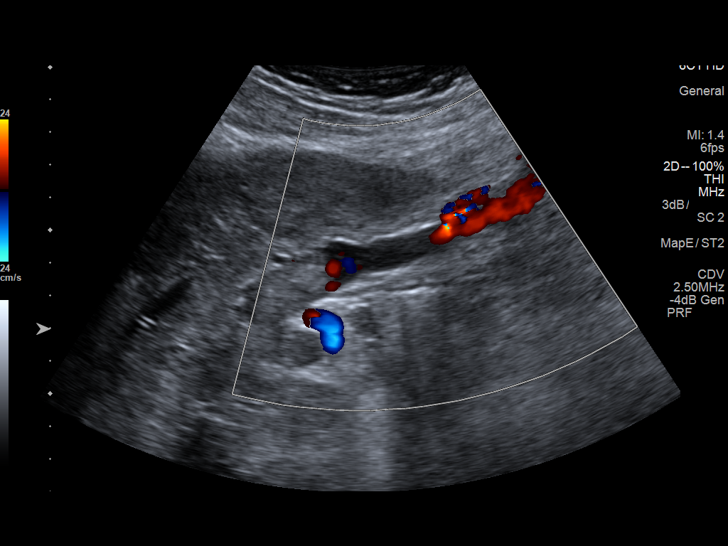
[im 45/49]
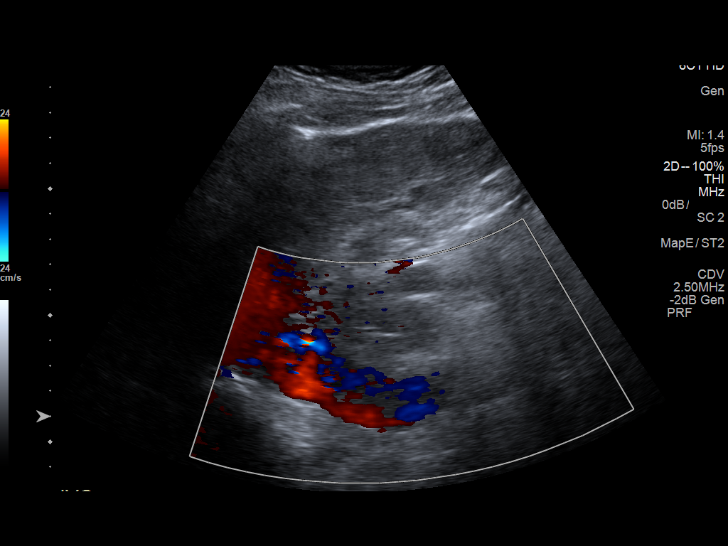
[im 49/49]
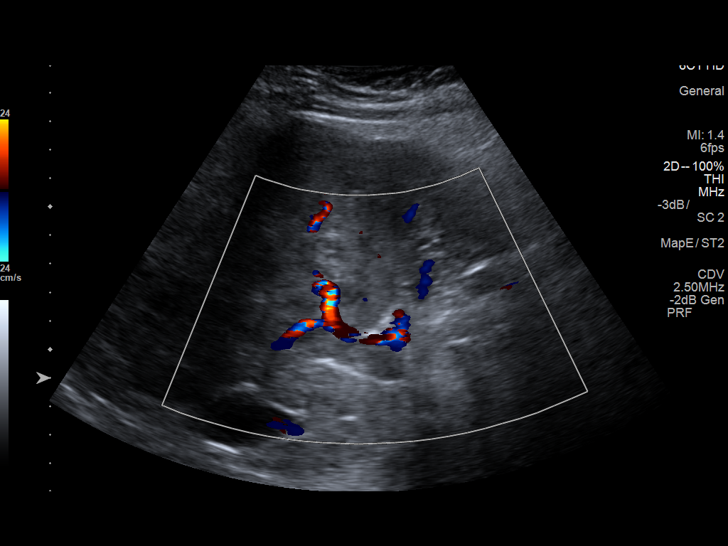

[13 of 25 positions shown; findings below may reference images not displayed]

FINDINGS: Portal Vein Velocities

Thrombosed.

Hepatic Vein Velocities

Right:  11.8 cm/sec

Middle:  32.0 cm/sec

Left:  25.4 cm/sec

Hepatic Artery Velocity:  207 cm/sec

Splenic Vein Velocity:  13.4 cm/sec

Varices: Varices are not clearly visualized on this study. Varices
are present on the accompanying CT from yesterday.

Ascites: A small amount of ascites is present about the liver.

The portal vein is hypoechoic. Color Doppler imaging confirms
thrombosis. Right and left portal vein branches are also thrombosed.
Varices noted on the accompanying CT are not clearly visualized on
this study. The liver is diffusely heterogeneous compatible with
diffuse hepatic parenchymal disease. Hepatic veins are patent with
hepato fugal directionality of flow.
IMPRESSION: Portal vein thrombosis. The patient has known portal vein
thrombosis.

Hepatic and splenic veins are patent.

The liver is diffusely heterogeneous compatible with cirrhosis.

Small amount of ascites.
